# Patient Record
Sex: Male | Born: 1935 | Race: White | Hispanic: No | Marital: Single | Smoking: Former smoker
Health system: Southern US, Community
[De-identification: ages and names within clinical notes are randomized; demographics above are authoritative.]

## PROBLEM LIST (undated history)

## (undated) DIAGNOSIS — N182 Chronic kidney disease, stage 2 (mild): Secondary | ICD-10-CM

## (undated) DIAGNOSIS — R609 Edema, unspecified: Secondary | ICD-10-CM

## (undated) DIAGNOSIS — I270 Primary pulmonary hypertension: Secondary | ICD-10-CM

## (undated) DIAGNOSIS — L608 Other nail disorders: Secondary | ICD-10-CM

## (undated) DIAGNOSIS — Z7901 Long term (current) use of anticoagulants: Secondary | ICD-10-CM

## (undated) DIAGNOSIS — I238 Other current complications following acute myocardial infarction: Secondary | ICD-10-CM

## (undated) DIAGNOSIS — E039 Hypothyroidism, unspecified: Secondary | ICD-10-CM

## (undated) DIAGNOSIS — N529 Male erectile dysfunction, unspecified: Secondary | ICD-10-CM

## (undated) DIAGNOSIS — Z87891 Personal history of nicotine dependence: Secondary | ICD-10-CM

## (undated) DIAGNOSIS — K219 Gastro-esophageal reflux disease without esophagitis: Secondary | ICD-10-CM

## (undated) DIAGNOSIS — E785 Hyperlipidemia, unspecified: Secondary | ICD-10-CM

## (undated) DIAGNOSIS — I209 Angina pectoris, unspecified: Secondary | ICD-10-CM

## (undated) DIAGNOSIS — I2510 Atherosclerotic heart disease of native coronary artery without angina pectoris: Secondary | ICD-10-CM

## (undated) DIAGNOSIS — I10 Essential (primary) hypertension: Secondary | ICD-10-CM

## (undated) DIAGNOSIS — R413 Other amnesia: Secondary | ICD-10-CM

## (undated) DIAGNOSIS — H9190 Unspecified hearing loss, unspecified ear: Secondary | ICD-10-CM

## (undated) DIAGNOSIS — I509 Heart failure, unspecified: Secondary | ICD-10-CM

## (undated) DIAGNOSIS — I5021 Acute systolic (congestive) heart failure: Secondary | ICD-10-CM

## (undated) DIAGNOSIS — N183 Chronic kidney disease, stage 3 unspecified: Secondary | ICD-10-CM

## (undated) DIAGNOSIS — N433 Hydrocele, unspecified: Secondary | ICD-10-CM

## (undated) DIAGNOSIS — E119 Type 2 diabetes mellitus without complications: Secondary | ICD-10-CM

## (undated) DIAGNOSIS — I82409 Acute embolism and thrombosis of unspecified deep veins of unspecified lower extremity: Secondary | ICD-10-CM

## (undated) DIAGNOSIS — I2789 Other specified pulmonary heart diseases: Secondary | ICD-10-CM

## (undated) DIAGNOSIS — D649 Anemia, unspecified: Secondary | ICD-10-CM

## (undated) DIAGNOSIS — I4891 Unspecified atrial fibrillation: Secondary | ICD-10-CM

## (undated) DIAGNOSIS — H259 Unspecified age-related cataract: Secondary | ICD-10-CM

## (undated) DIAGNOSIS — R32 Unspecified urinary incontinence: Secondary | ICD-10-CM

## (undated) DIAGNOSIS — E559 Vitamin D deficiency, unspecified: Secondary | ICD-10-CM

## (undated) DIAGNOSIS — I43 Cardiomyopathy in diseases classified elsewhere: Secondary | ICD-10-CM

## (undated) DIAGNOSIS — L218 Other seborrheic dermatitis: Secondary | ICD-10-CM

## (undated) DIAGNOSIS — E876 Hypokalemia: Secondary | ICD-10-CM

## (undated) DIAGNOSIS — D509 Iron deficiency anemia, unspecified: Secondary | ICD-10-CM

## (undated) DIAGNOSIS — R269 Unspecified abnormalities of gait and mobility: Secondary | ICD-10-CM

## (undated) DIAGNOSIS — I428 Other cardiomyopathies: Secondary | ICD-10-CM

## (undated) DIAGNOSIS — R0602 Shortness of breath: Secondary | ICD-10-CM

## (undated) DIAGNOSIS — R39198 Other difficulties with micturition: Secondary | ICD-10-CM

## (undated) HISTORY — DX: Vitamin D deficiency, unspecified: E55.9

## (undated) HISTORY — DX: Other cardiomyopathies: I42.8

## (undated) HISTORY — DX: Cardiomyopathy in diseases classified elsewhere: I43

## (undated) HISTORY — DX: Hydrocele, unspecified: N43.3

## (undated) HISTORY — DX: Other difficulties with micturition: R39.198

## (undated) HISTORY — DX: Iron deficiency anemia, unspecified: D50.9

## (undated) HISTORY — PX: CORONARY ANGIOPLASTY WITH STENT PLACEMENT: SHX49

## (undated) HISTORY — DX: Other amnesia: R41.3

## (undated) HISTORY — DX: Other seborrheic dermatitis: L21.8

## (undated) HISTORY — DX: Chronic kidney disease, stage 3 unspecified: N18.30

## (undated) HISTORY — PX: OTHER SURGICAL HISTORY: SHX169

## (undated) HISTORY — DX: Acute systolic (congestive) heart failure: I50.21

## (undated) HISTORY — DX: Hypokalemia: E87.6

## (undated) HISTORY — DX: Primary pulmonary hypertension: I27.0

## (undated) HISTORY — DX: Edema, unspecified: R60.9

## (undated) HISTORY — DX: Chronic kidney disease, stage 2 (mild): N18.2

## (undated) HISTORY — DX: Anemia, unspecified: D64.9

## (undated) HISTORY — DX: Angina pectoris, unspecified: I20.9

## (undated) HISTORY — DX: Unspecified urinary incontinence: R32

## (undated) HISTORY — DX: Chronic kidney disease, stage 3 (moderate): N18.3

## (undated) HISTORY — DX: Acute embolism and thrombosis of unspecified deep veins of unspecified lower extremity: I82.409

## (undated) HISTORY — DX: Unspecified abnormalities of gait and mobility: R26.9

## (undated) HISTORY — DX: Other nail disorders: L60.8

## (undated) HISTORY — DX: Type 2 diabetes mellitus without complications: E11.9

## (undated) HISTORY — DX: Personal history of nicotine dependence: Z87.891

## (undated) HISTORY — DX: Hypothyroidism, unspecified: E03.9

## (undated) HISTORY — DX: Unspecified hearing loss, unspecified ear: H91.90

## (undated) HISTORY — DX: Unspecified age-related cataract: H25.9

## (undated) HISTORY — DX: Long term (current) use of anticoagulants: Z79.01

## (undated) HISTORY — DX: Essential (primary) hypertension: I10

## (undated) HISTORY — DX: Shortness of breath: R06.02

## (undated) HISTORY — DX: Male erectile dysfunction, unspecified: N52.9

## (undated) HISTORY — DX: Other current complications following acute myocardial infarction: I23.8

## (undated) HISTORY — DX: Hyperlipidemia, unspecified: E78.5

## (undated) HISTORY — DX: Atherosclerotic heart disease of native coronary artery without angina pectoris: I25.10

## (undated) HISTORY — DX: Other specified pulmonary heart diseases: I27.89

## (undated) HISTORY — DX: Unspecified atrial fibrillation: I48.91

## (undated) HISTORY — DX: Gastro-esophageal reflux disease without esophagitis: K21.9

---

## 1983-09-07 HISTORY — PX: CHOLECYSTECTOMY: SHX55

## 2009-05-07 HISTORY — PX: OTHER SURGICAL HISTORY: SHX169

## 2009-05-07 HISTORY — PX: DOPPLER ECHOCARDIOGRAPHY: SHX263

## 2009-05-12 ENCOUNTER — Ambulatory Visit: Payer: Self-pay | Admitting: Internal Medicine

## 2009-05-12 ENCOUNTER — Inpatient Hospital Stay (HOSPITAL_COMMUNITY): Admission: EM | Admit: 2009-05-12 | Discharge: 2009-05-26 | Payer: Self-pay | Admitting: Emergency Medicine

## 2009-05-13 ENCOUNTER — Encounter: Payer: Self-pay | Admitting: Internal Medicine

## 2009-05-13 ENCOUNTER — Encounter: Payer: Self-pay | Admitting: Cardiovascular Disease

## 2009-05-14 ENCOUNTER — Encounter (INDEPENDENT_AMBULATORY_CARE_PROVIDER_SITE_OTHER): Payer: Self-pay | Admitting: Internal Medicine

## 2009-05-14 ENCOUNTER — Ambulatory Visit: Payer: Self-pay | Admitting: Surgery

## 2009-05-15 ENCOUNTER — Encounter: Payer: Self-pay | Admitting: Cardiovascular Disease

## 2009-05-27 ENCOUNTER — Encounter: Payer: Self-pay | Admitting: Cardiovascular Disease

## 2009-05-28 ENCOUNTER — Ambulatory Visit: Payer: Self-pay | Admitting: Internal Medicine

## 2009-05-28 ENCOUNTER — Encounter: Payer: Self-pay | Admitting: Cardiovascular Disease

## 2009-05-28 DIAGNOSIS — D509 Iron deficiency anemia, unspecified: Secondary | ICD-10-CM

## 2009-05-28 DIAGNOSIS — I5021 Acute systolic (congestive) heart failure: Secondary | ICD-10-CM

## 2009-05-28 DIAGNOSIS — I2589 Other forms of chronic ischemic heart disease: Secondary | ICD-10-CM

## 2009-05-28 DIAGNOSIS — E876 Hypokalemia: Secondary | ICD-10-CM | POA: Insufficient documentation

## 2009-05-28 DIAGNOSIS — Z87891 Personal history of nicotine dependence: Secondary | ICD-10-CM

## 2009-05-28 DIAGNOSIS — E119 Type 2 diabetes mellitus without complications: Secondary | ICD-10-CM

## 2009-05-28 DIAGNOSIS — I4891 Unspecified atrial fibrillation: Secondary | ICD-10-CM

## 2009-05-28 DIAGNOSIS — I2789 Other specified pulmonary heart diseases: Secondary | ICD-10-CM

## 2009-05-28 DIAGNOSIS — I10 Essential (primary) hypertension: Secondary | ICD-10-CM

## 2009-05-28 DIAGNOSIS — N183 Chronic kidney disease, stage 3 (moderate): Secondary | ICD-10-CM

## 2009-05-28 LAB — CONVERTED CEMR LAB

## 2009-06-04 ENCOUNTER — Ambulatory Visit: Payer: Self-pay | Admitting: Cardiovascular Disease

## 2009-06-04 DIAGNOSIS — I82409 Acute embolism and thrombosis of unspecified deep veins of unspecified lower extremity: Secondary | ICD-10-CM | POA: Insufficient documentation

## 2009-06-04 DIAGNOSIS — I2510 Atherosclerotic heart disease of native coronary artery without angina pectoris: Secondary | ICD-10-CM

## 2009-06-04 DIAGNOSIS — I238 Other current complications following acute myocardial infarction: Secondary | ICD-10-CM

## 2009-06-10 ENCOUNTER — Ambulatory Visit: Payer: Self-pay | Admitting: Cardiology

## 2009-06-17 ENCOUNTER — Ambulatory Visit: Payer: Self-pay | Admitting: Cardiology

## 2009-06-18 ENCOUNTER — Encounter: Payer: Self-pay | Admitting: Cardiovascular Disease

## 2009-06-24 ENCOUNTER — Ambulatory Visit: Payer: Self-pay | Admitting: Cardiology

## 2009-06-26 ENCOUNTER — Telehealth: Payer: Self-pay | Admitting: Cardiovascular Disease

## 2009-06-27 ENCOUNTER — Encounter: Payer: Self-pay | Admitting: Cardiovascular Disease

## 2009-07-01 ENCOUNTER — Ambulatory Visit: Payer: Self-pay | Admitting: Cardiovascular Disease

## 2009-07-08 ENCOUNTER — Ambulatory Visit: Payer: Self-pay | Admitting: Internal Medicine

## 2009-07-08 LAB — CONVERTED CEMR LAB: POC INR: 2.2

## 2009-07-21 ENCOUNTER — Ambulatory Visit: Payer: Self-pay | Admitting: Cardiology

## 2009-07-21 LAB — CONVERTED CEMR LAB: POC INR: 4

## 2009-08-07 ENCOUNTER — Encounter: Payer: Self-pay | Admitting: Cardiovascular Disease

## 2009-08-11 ENCOUNTER — Encounter: Payer: Self-pay | Admitting: Internal Medicine

## 2009-08-13 ENCOUNTER — Encounter (INDEPENDENT_AMBULATORY_CARE_PROVIDER_SITE_OTHER): Payer: Self-pay | Admitting: Cardiology

## 2009-08-13 ENCOUNTER — Ambulatory Visit: Payer: Self-pay | Admitting: Cardiology

## 2009-08-18 ENCOUNTER — Ambulatory Visit: Payer: Self-pay | Admitting: Cardiovascular Disease

## 2009-08-25 ENCOUNTER — Encounter: Payer: Self-pay | Admitting: Cardiovascular Disease

## 2009-09-09 ENCOUNTER — Ambulatory Visit: Payer: Self-pay | Admitting: Cardiology

## 2009-09-09 LAB — CONVERTED CEMR LAB: POC INR: 1.6

## 2009-09-19 ENCOUNTER — Telehealth (INDEPENDENT_AMBULATORY_CARE_PROVIDER_SITE_OTHER): Payer: Self-pay | Admitting: *Deleted

## 2009-09-23 LAB — CONVERTED CEMR LAB
Chloride: 106 meq/L (ref 96–112)
GFR calc non Af Amer: 32.64 mL/min (ref >60)
Potassium: 3.8 meq/L (ref 3.5–5.1)
Sodium: 144 meq/L (ref 135–145)

## 2009-09-24 ENCOUNTER — Encounter: Payer: Self-pay | Admitting: Cardiovascular Disease

## 2009-10-01 ENCOUNTER — Ambulatory Visit: Payer: Self-pay | Admitting: Cardiovascular Disease

## 2009-10-01 ENCOUNTER — Ambulatory Visit: Payer: Self-pay | Admitting: Cardiology

## 2009-10-01 LAB — CONVERTED CEMR LAB: POC INR: 2.6

## 2009-10-14 LAB — CONVERTED CEMR LAB
GFR calc non Af Amer: 17.52 mL/min (ref >60)
Potassium: 4.9 meq/L (ref 3.5–5.1)
Sodium: 139 meq/L (ref 135–145)

## 2009-10-17 ENCOUNTER — Ambulatory Visit: Payer: Self-pay | Admitting: Cardiovascular Disease

## 2009-10-17 DIAGNOSIS — I428 Other cardiomyopathies: Secondary | ICD-10-CM | POA: Insufficient documentation

## 2009-10-23 ENCOUNTER — Ambulatory Visit: Payer: Self-pay | Admitting: Cardiovascular Disease

## 2009-10-23 ENCOUNTER — Ambulatory Visit: Payer: Self-pay | Admitting: Cardiology

## 2009-10-23 LAB — CONVERTED CEMR LAB
CO2: 31 meq/L (ref 19–32)
Calcium: 9.7 mg/dL (ref 8.4–10.5)
Chloride: 103 meq/L (ref 96–112)
Potassium: 4.6 meq/L (ref 3.5–5.1)
Sodium: 141 meq/L (ref 135–145)

## 2009-10-30 LAB — CONVERTED CEMR LAB
CO2: 30 meq/L (ref 19–32)
Chloride: 105 meq/L (ref 96–112)
GFR calc non Af Amer: 24.41 mL/min (ref >60)
Glucose, Bld: 112 mg/dL — ABNORMAL HIGH (ref 70–99)
Potassium: 4.7 meq/L (ref 3.5–5.1)
Sodium: 141 meq/L (ref 135–145)

## 2009-11-04 ENCOUNTER — Ambulatory Visit: Payer: Self-pay | Admitting: Cardiovascular Disease

## 2009-11-19 ENCOUNTER — Ambulatory Visit: Payer: Self-pay | Admitting: Internal Medicine

## 2009-12-03 ENCOUNTER — Encounter: Payer: Self-pay | Admitting: Cardiovascular Disease

## 2009-12-03 ENCOUNTER — Ambulatory Visit (HOSPITAL_COMMUNITY): Admission: RE | Admit: 2009-12-03 | Discharge: 2009-12-03 | Payer: Self-pay | Admitting: Cardiology

## 2009-12-03 ENCOUNTER — Ambulatory Visit: Payer: Self-pay

## 2009-12-03 ENCOUNTER — Ambulatory Visit: Payer: Self-pay | Admitting: Internal Medicine

## 2009-12-03 ENCOUNTER — Ambulatory Visit: Payer: Self-pay | Admitting: Cardiovascular Disease

## 2009-12-03 LAB — CONVERTED CEMR LAB: POC INR: 1.3

## 2009-12-12 ENCOUNTER — Ambulatory Visit: Payer: Self-pay | Admitting: Cardiology

## 2009-12-18 ENCOUNTER — Ambulatory Visit: Payer: Self-pay | Admitting: Internal Medicine

## 2010-01-15 ENCOUNTER — Ambulatory Visit: Payer: Self-pay | Admitting: Internal Medicine

## 2010-01-15 LAB — CONVERTED CEMR LAB: POC INR: 2.8

## 2010-02-12 ENCOUNTER — Ambulatory Visit: Payer: Self-pay | Admitting: Internal Medicine

## 2010-02-12 LAB — CONVERTED CEMR LAB: POC INR: 1.2

## 2010-02-19 ENCOUNTER — Ambulatory Visit: Payer: Self-pay | Admitting: Cardiology

## 2010-03-26 ENCOUNTER — Ambulatory Visit: Payer: Self-pay | Admitting: Cardiology

## 2010-04-01 ENCOUNTER — Ambulatory Visit: Payer: Self-pay | Admitting: Cardiology

## 2010-04-01 LAB — CONVERTED CEMR LAB: POC INR: 1

## 2010-04-07 ENCOUNTER — Ambulatory Visit: Payer: Self-pay | Admitting: Cardiology

## 2010-04-09 LAB — CONVERTED CEMR LAB
BUN: 21 mg/dL (ref 6–23)
CO2: 26 meq/L (ref 19–32)
Chloride: 107 meq/L (ref 96–112)
Creatinine, Ser: 1.7 mg/dL — ABNORMAL HIGH (ref 0.4–1.5)

## 2010-04-14 ENCOUNTER — Ambulatory Visit: Payer: Self-pay | Admitting: Cardiology

## 2010-05-13 ENCOUNTER — Ambulatory Visit: Payer: Self-pay | Admitting: Cardiovascular Disease

## 2010-05-22 ENCOUNTER — Ambulatory Visit: Payer: Self-pay | Admitting: Cardiovascular Disease

## 2010-05-26 LAB — CONVERTED CEMR LAB
ALT: 13 units/L (ref 0–53)
AST: 18 units/L (ref 0–37)
Bilirubin, Direct: 0.2 mg/dL (ref 0.0–0.3)
CO2: 26 meq/L (ref 19–32)
Calcium: 9.5 mg/dL (ref 8.4–10.5)
Creatinine, Ser: 1.8 mg/dL — ABNORMAL HIGH (ref 0.4–1.5)
Glucose, Bld: 119 mg/dL — ABNORMAL HIGH (ref 70–99)
Total Bilirubin: 0.9 mg/dL (ref 0.3–1.2)

## 2010-05-29 ENCOUNTER — Ambulatory Visit: Payer: Self-pay | Admitting: Internal Medicine

## 2010-08-03 ENCOUNTER — Telehealth (INDEPENDENT_AMBULATORY_CARE_PROVIDER_SITE_OTHER): Payer: Self-pay | Admitting: *Deleted

## 2010-10-02 ENCOUNTER — Ambulatory Visit: Admit: 2010-10-02 | Payer: Self-pay

## 2010-10-04 LAB — CONVERTED CEMR LAB
BUN: 19 mg/dL (ref 6–23)
Basophils Relative: 0.7 % (ref 0.0–3.0)
CO2: 27 meq/L (ref 19–32)
CO2: 29 meq/L (ref 19–32)
Chloride: 104 meq/L (ref 96–112)
Chloride: 108 meq/L (ref 96–112)
Creatinine, Ser: 1.8 mg/dL — ABNORMAL HIGH (ref 0.4–1.5)
Creatinine, Ser: 1.9 mg/dL — ABNORMAL HIGH (ref 0.4–1.5)
Eosinophils Absolute: 0.4 10*3/uL (ref 0.0–0.7)
Eosinophils Relative: 5.8 % — ABNORMAL HIGH (ref 0.0–5.0)
HCT: 32.7 % — ABNORMAL LOW (ref 39.0–52.0)
Lymphs Abs: 1.4 10*3/uL (ref 0.7–4.0)
MCHC: 35 g/dL (ref 30.0–36.0)
MCV: 93.8 fL (ref 78.0–100.0)
Monocytes Absolute: 0.7 10*3/uL (ref 0.1–1.0)
Platelets: 171 10*3/uL (ref 150.0–400.0)
Potassium: 3.3 meq/L — ABNORMAL LOW (ref 3.5–5.1)
RBC: 3.49 M/uL — ABNORMAL LOW (ref 4.22–5.81)
WBC: 7.3 10*3/uL (ref 4.5–10.5)

## 2010-10-06 NOTE — Medication Information (Signed)
Summary: rov/sp  Anticoagulant Therapy  Managed by: Bethena Midget, RN, BSN Referring MD: Clifton James MD, Cristal Deer PCP: Murray Hodgkins Supervising MD: Myrtis Ser MD, Tinnie Gens Indication 1: Atrial Fibrillation Indication 2: DVT Lab Used: [facility] [place] Site: [address] INR POC 1.2 INR RANGE 2-3  Dietary changes: no    Health status changes: no    Bleeding/hemorrhagic complications: no    Recent/future hospitalizations: no    Any changes in medication regimen? no    Recent/future dental: no  Any missed doses?: no       Is patient compliant with meds? yes       Allergies: 1)  Zestril  Anticoagulation Management History:      The patient is taking warfarin and comes in today for a routine follow up visit.  Positive risk factors for bleeding include an age of 75 years or older and presence of serious comorbidities.  The bleeding index is 'intermediate risk'.  Positive CHADS2 values include History of CHF, History of HTN, Age > 63 years old, and History of Diabetes.  Anticoagulation responsible provider: Myrtis Ser MD, Tinnie Gens.  INR POC: 1.2.  Cuvette Lot#: [id number].  Exp: 04/2011.    Anticoagulation Management Assessment/Plan:      The patient's current anticoagulation dose is Warfarin sodium 5 mg tabs: Use as directed by Anticoagulation Clinic.  The target INR is 2.0-3.0.  The next INR is due 03/02/2010.  Anticoagulation instructions were given to patient.  Results were reviewed/authorized by Bethena Midget, RN, BSN.  He was notified by Bethena Midget, RN, BSN.         Prior Anticoagulation Instructions: INR 1.2  Take 2 tablets today (Thursday), 1 1/2 tablets tomorrow (Friday), then resume same dose of 1/2 tablet every day except 1 tablet on Tuesday, Thursday and Saturday.    Current Anticoagulation Instructions: INR 1.2 Today take 2 pills then change dose to 1 pill everyday except 1/2 pill on Mondays, Wednesdays and Fridays. Recheck in 10 days.

## 2010-10-06 NOTE — Medication Information (Signed)
Summary: rov--coumadin clinic  Anticoagulant Therapy  Managed by: Weston Brass, PharmD Referring MD: Clifton Zakir Henner MD, Cristal Deer PCP: Murray Hodgkins Supervising MD: Johney Frame MD, Fayrene Fearing Indication 1: Atrial Fibrillation Indication 2: DVT Lab Used: [facility] [place] Site: [address] INR POC 2.8 INR RANGE 2-3  Dietary changes: no    Health status changes: no    Bleeding/hemorrhagic complications: no    Recent/future hospitalizations: no    Any changes in medication regimen? no    Recent/future dental: no  Any missed doses?: no       Is patient compliant with meds? yes       Allergies: 1)  Zestril  Anticoagulation Management History:      The patient is taking warfarin and comes in today for a routine follow up visit.  Positive risk factors for bleeding include an age of 75 years or older and presence of serious comorbidities.  The bleeding index is 'intermediate risk'.  Positive CHADS2 values include History of CHF, History of HTN, Age > 75 years old, and History of Diabetes.  Anticoagulation responsible provider: Harding Thomure MD, Fayrene Fearing.  INR POC: 2.8.  Cuvette Lot#: [id number].  Exp: 04/2011.    Anticoagulation Management Assessment/Plan:      The patient's current anticoagulation dose is Warfarin sodium 5 mg tabs: Use as directed by Anticoagulation Clinic.  The target INR is 2.0-3.0.  The next INR is due 02/12/2010.  Anticoagulation instructions were given to patient/spouse.  Results were reviewed/authorized by Weston Brass, PharmD.  He was notified by Weston Brass PharmD.         Prior Anticoagulation Instructions: INR=2.0  will continue with present dosing schedule---1/2 tablet coumadin on sun,mon,wed,fri--1 tablet on tues,thur,saturday  Current Anticoagulation Instructions: INR 2.8  Continue same dose of 1/2 tablet every day except 1 tablet on Tuesday, Thursday and Saturday.

## 2010-10-06 NOTE — Medication Information (Signed)
Summary: rov/sp  Anticoagulant Therapy  Managed by: Cloyde Reams, RN, BSN Referring MD: Clifton James MD, Cristal Deer PCP: Murray Hodgkins Supervising MD: Daleen Squibb MD, Maisie Fus Indication 1: Atrial Fibrillation Indication 2: DVT Lab Used: [facility] [place] Site: [address] INR POC 2.2 INR RANGE 2-3  Dietary changes: no    Health status changes: no    Bleeding/hemorrhagic complications: no    Recent/future hospitalizations: no    Any changes in medication regimen? no    Recent/future dental: no  Any missed doses?: no       Is patient compliant with meds? yes       Allergies: 1)  Zestril  Anticoagulation Management History:      The patient is taking warfarin and comes in today for a routine follow up visit.  Positive risk factors for bleeding include an age of 75 years or older and presence of serious comorbidities.  The bleeding index is 'intermediate risk'.  Positive CHADS2 values include History of CHF, History of HTN, Age > 32 years old, and History of Diabetes.  Anticoagulation responsible Ruvim Risko: Daleen Squibb MD, Maisie Fus.  INR POC: 2.2.  Cuvette Lot#: [id number].  Exp: 06/2011.    Anticoagulation Management Assessment/Plan:      The patient's current anticoagulation dose is Warfarin sodium 5 mg tabs: Use as directed by Anticoagulation Clinic.  The target INR is 2.0-3.0.  The next INR is due 04/24/2010.  Anticoagulation instructions were given to patient.  Results were reviewed/authorized by Cloyde Reams, RN, BSN.  He was notified by Cloyde Reams RN.         Prior Anticoagulation Instructions: INR 1.3  Take 5mg  daily (oblong peach tablet- has #5 on one side and barr on the other side)   Current Anticoagulation Instructions: INR 2.2  Continue on same dosage 5mg  daily (OBLONG PEACH TABLET-has #5 on one side and barr on other side.  Recheck in 10 days.

## 2010-10-06 NOTE — Medication Information (Signed)
Summary: coumadin  Anticoagulant Therapy  Managed by: Cloyde Reams, RN, BSN Referring MD: Clifton James MD, Cristal Deer PCP: Murray Hodgkins Supervising MD: Eden Emms MD, Theron Arista Indication 1: Atrial Fibrillation Indication 2: DVT Lab Used: [facility] [place] Site: [address] INR POC 1.3 INR RANGE 2-3  Dietary changes: no    Health status changes: no    Bleeding/hemorrhagic complications: no    Recent/future hospitalizations: no    Any changes in medication regimen? no    Recent/future dental: no  Any missed doses?: no       Is patient compliant with meds? yes       Allergies: 1)  Zestril  Anticoagulation Management History:      The patient is taking warfarin and comes in today for a routine follow up visit.  Positive risk factors for bleeding include an age of 75 years or older and presence of serious comorbidities.  The bleeding index is 'intermediate risk'.  Positive CHADS2 values include History of CHF, History of HTN, Age > 75 years old, and History of Diabetes.  Anticoagulation responsible provider: Eden Emms MD, Theron Arista.  INR POC: 1.3.  Cuvette Lot#: [id number].  Exp: 01/2011.    Anticoagulation Management Assessment/Plan:      The patient's current anticoagulation dose is Warfarin sodium 5 mg tabs: Use as directed by Anticoagulation Clinic.  The target INR is 2.0-3.0.  The next INR is due 12/12/2009.  Anticoagulation instructions were given to patient/spouse.  Results were reviewed/authorized by Cloyde Reams, RN, BSN.  He was notified by Cloyde Reams RN.         Prior Anticoagulation Instructions: INR 1.5  Start taking 1/2 tablet daily except 1 tablet on Wednesdays.  Recheck 2 weeks.    Current Anticoagulation Instructions: INR 1.3  Take 1 tablet today and tomorrow then increase dosage to 1/2 tablet daily except 1 tablet on Wednesdays and Saturdays.  Recheck in 10 days.

## 2010-10-06 NOTE — Letter (Signed)
Summary: [facility] discharge note  [facility] discharge note   Imported By: Kassie Mends 10/27/2009 10:04:39  _____________________________________________________________________  External Attachment:    Type:   Image     Comment:   External Document

## 2010-10-06 NOTE — Letter (Signed)
Summary: [facility] [employer]  [employer]   Imported By: Marylou Mccoy 09/16/2009 09:27:43  _____________________________________________________________________  External Attachment:    Type:   Image     Comment:   External Document

## 2010-10-06 NOTE — Assessment & Plan Note (Signed)
Summary: per check out/also having echo @1pm /saf   Visit Type:  Follow-up Primary Provider:  Murray Hodgkins  CC:  No complaints..  History of Present Illness: 77 yo WM with recent admission to hospital September 2009  with NSTEMI and was found to have severe LV dysfunction with EF of 20%, non-viable myocardium in anterior and inferior walls with high degree stenosis in a Ramus Intermediate branch and viability in the lateral wall now s/p bare metal stent to the Ramus Intermediate artery.  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 He had paroxysmal atrial fibrillation in the hospital. He is following in our coumadin clinic. He tells me that he has been feeling well without any SOB, CP, palpitations, near syncope or syncope.  He had no dizziness or vertigo.  Pt has continued gait instability and with his severe cardiomyopathy, he has weakness and generalized fatigue. He has not fallen at home. He has been using his scooter around the house.  His creatinine has been elevated from baseline and his Lasix was stopped several weeks ago.   Problems Prior to Update: 1)  Cardiomyopathy  (ICD-425.4) 2)  Cad, Native Vessel  (ICD-414.01) 3)  Dvt  (ICD-453.40) 4)  Mural Thrombus, Apex of Heart  (ICD-429.79) 5)  Mi/non St Elevation  (ICD-410.90) 6)  Paroxysmal Atrial Fibrillation  (ICD-427.31) 7)  Coumadin Therapy  (ICD-V58.61) 8)  Cardiomyopathy, Ischemic  (ICD-414.8) 9)  Systolic Heart Failure, Acute  (ICD-428.21) 10)  Renal Disease, Chronic, Stage Iii  (ICD-585.3) 11)  Pulmonary Hypertension  (ICD-416.8) 12)  Hypertension  (ICD-401.9) 13)  Hypokalemia  (ICD-276.8) 14)  Tobacco Use, Quit  (ICD-V15.82) 15)  Dm  (ICD-250.00) 16)  Anemia, Iron Deficiency  (ICD-280.9)  Current Medications (verified): 1)  Crestor 40 Mg Tabs (Rosuvastatin Calcium) .... Take One Tablet By Mouth Daily. 2)  Aspirin Ec 325 Mg Tbec  (Aspirin) .... Take One Half Tablet By Mouth Daily 3)  Warfarin Sodium 5 Mg Tabs (Warfarin Sodium) .... Use As Directed By Anticoagulation Clinic 4)  Amiodarone Hcl 200 Mg Tabs (Amiodarone Hcl) .... Take One Tablet By Mouth Daily 5)  Carvedilol 6.25 Mg Tabs (Carvedilol) .... Take One Tablet By Mouth Twice A Day 6)  Nitrostat 0.4 Mg Subl (Nitroglycerin) .... Place 1 Tablet Under The Tounge Every 5 Minutes As Needed For Chest Pain 7)  Ferrous Sulfate 325 (65 Fe) Mg Tabs (Ferrous Sulfate) .... Take 1 Tablet By Mouth Two Times A Day With Food 8)  Metformin Hcl 750 Mg Xr24h-Tab (Metformin Hcl) .... Take 1 Tablet By Mouth Once Daily With Food 9)  Lisinopril 5 Mg Tabs (Lisinopril) .... Take One Tablet By Mouth Daily 10)  Potassium Chloride Crys Cr 20 Meq Cr-Tabs (Potassium Chloride Crys Cr) .... Take One Tablet By Mouth Daily  Allergies: 1)  Zestril  Past History:  Past Medical History: Reviewed history from 06/04/2009 and no changes required. MI/NON ST ELEVATION (ICD-410.90) PAROXYSMAL ATRIAL FIBRILLATION (ICD-427.31) COUMADIN THERAPY (ICD-V58.61) CARDIOMYOPATHY, ISCHEMIC (ICD-414.8) SYSTOLIC HEART FAILURE, ACUTE (ICD-428.21), EF of 20%.  RENAL DISEASE, CHRONIC, STAGE III (ICD-585.3) PULMONARY HYPERTENSION (ICD-416.8) HYPERTENSION (ICD-401.9) HYPOKALEMIA (ICD-276.8) TOBACCO USE, QUIT (ICD-V15.82) DM (ICD-250.00) ANEMIA, IRON DEFICIENCY (ICD-280.9) Left ventricular apical thrombus Right lower ext DVT     Social History: Reviewed history from 05/28/2009 and no changes required. Lives in [place] with his girlfriend who he has been  with for over 40 years.  He has  a 100 pack-year smoking history but quit  in 1996.  He denies the use of alcohol or the use of illicit drugs.  Review of Systems       Right lower ext edema, mild, chronic. Gait instability without dizziness.   Vital Signs:  Patient profile:   77 year old male Height:      70 inches Weight:      193 pounds BMI:      27.79 BP sitting:   149 / 74  (right arm) Cuff size:   regular  Vitals Entered By: Oswald Hillock (November 04, 2009 2:39 PM)  Physical Exam  General:  General: Well developed, well nourished, NAD HEENT: OP clear, mucus membranes moist SKIN: warm, dry Neuro: No focal deficits Musculoskeletal: Muscle strength 5/5 all ext Psychiatric: Mood and affect normal Neck: No JVD, no carotid bruits, no thyromegaly, no lymphadenopathy. Lungs:Clear bilaterally, no wheezes, rhonci, crackles CV: RRR. Systolic  murmur. No gallops rubs Abdomen: soft, NT, ND, BS present Extremities: Trace to 1+ right lower ext edema, No left lower ext edema. pulses 2+.     EKG  Procedure date:  11/04/2009  Findings:      Sinus bradycardia, rate 59 bpm. Prior septal infarct. Q waves lead III.   Impression & Recommendations:  Problem # 1:  CARDIOMYOPATHY, ISCHEMIC (ICD-414.8) Volume status ok. He denies any SOB however he is not an active gentleman. I have held his Lasix secondary to renal insufficiency. Will recheck BMET today. If creatinine still elevated, will d/c Ace-inhibitor. Continue beta blocker, ASA, statin. I had planned to repeat an echo today but he missed the echo appt and showed up late for his scheduled visit. Will schedule echo for next several weeks.   Orders: TLB-BMP (Basic Metabolic Panel-BMET) (80048-METABOL)  His updated medication list for this problem includes:    Aspirin Ec 325 Mg Tbec (Aspirin) .Marland Kitchen... Take one half tablet by mouth daily    Warfarin Sodium 5 Mg Tabs (Warfarin sodium) ..... Use as directed by anticoagulation clinic    Amiodarone Hcl 200 Mg Tabs (Amiodarone hcl) .Marland Kitchen... Take one tablet by mouth daily    Carvedilol 6.25 Mg Tabs (Carvedilol) .Marland Kitchen... Take one tablet by mouth twice a day    Nitrostat 0.4 Mg Subl (Nitroglycerin) .Marland Kitchen... Place 1 tablet under the tounge every 5 minutes as needed for chest pain    Lisinopril 5 Mg Tabs (Lisinopril) .Marland Kitchen... Take one tablet by mouth  daily  Problem # 2:  CAD, NATIVE VESSEL (ICD-414.01) Stable. Continue meds as listed below.   His updated medication list for this problem includes:    Aspirin Ec 325 Mg Tbec (Aspirin) .Marland Kitchen... Take one half tablet by mouth daily    Warfarin Sodium 5 Mg Tabs (Warfarin sodium) ..... Use as directed by anticoagulation clinic    Carvedilol 6.25 Mg Tabs (Carvedilol) .Marland Kitchen... Take one tablet by mouth twice a day    Nitrostat 0.4 Mg Subl (Nitroglycerin) .Marland Kitchen... Place 1 tablet under the tounge every 5 minutes as needed for chest pain    Lisinopril 5 Mg Tabs (Lisinopril) .Marland Kitchen... Take one tablet by mouth daily  Orders: EKG w/ Interpretation (93000)  Problem # 3:  MURAL THROMBUS, APEX OF HEART (ICD-429.79) On coumadin therapy. Folllowed in our coumadin clinic.   Problem # 4:  DVT (ICD-453.40) Chronic right  lower ext DVT. On coumadin therapy.   Problem # 5:  PAROXYSMAL ATRIAL FIBRILLATION (ICD-427.31) Maintaining sinus rhythm on amiodarone, Coreg. He is on chronic coumadin therapy.  His updated medication list for this problem includes:    Aspirin Ec 325 Mg Tbec (Aspirin) .Marland Kitchen... Take one half tablet by mouth daily    Warfarin Sodium 5 Mg Tabs (Warfarin sodium) ..... Use as directed by anticoagulation clinic    Amiodarone Hcl 200 Mg Tabs (Amiodarone hcl) .Marland Kitchen... Take one tablet by mouth daily    Carvedilol 6.25 Mg Tabs (Carvedilol) .Marland Kitchen... Take one tablet by mouth twice a day  Problem # 6:  RENAL DISEASE, CHRONIC, STAGE III (ICD-585.3) Repeat BMET today.   Problem # 7:  HYPERTENSION (ICD-401.9) BP mildly elevated today however I will not make any medication changes. He is sensitive to changes in his medications. I do  not want to tip the balance since he is doing so well.   His updated medication list for this problem includes:    Aspirin Ec 325 Mg Tbec (Aspirin) .Marland Kitchen... Take one half tablet by mouth daily    Carvedilol 6.25 Mg Tabs (Carvedilol) .Marland Kitchen... Take one tablet by mouth twice a day    Lisinopril 5  Mg Tabs (Lisinopril) .Marland Kitchen... Take one tablet by mouth daily  Patient Instructions: 1)  Your physician recommends that you schedule a follow-up appointment in: 6 months 2)  Echo resheduled to day of next coumadin clinic appointment--November 13, 2009

## 2010-10-06 NOTE — Medication Information (Signed)
Summary: rov/sp  Anticoagulant Therapy  Managed by: Weston Brass, PharmD Referring MD: Clifton James MD, Cristal Deer PCP: Murray Hodgkins Supervising MD: Daleen Squibb MD, Maisie Fus Indication 1: Atrial Fibrillation Indication 2: DVT Lab Used: [facility] [place] Site: [address] INR POC 1.0 INR RANGE 2-3  Dietary changes: no    Health status changes: no    Bleeding/hemorrhagic complications: no    Recent/future hospitalizations: no    Any changes in medication regimen? no    Recent/future dental: no  Any missed doses?: no       Is patient compliant with meds? yes      Comments: Pt does not report missing any doses.  Asked him to bring medications in at next visit so we can make sure he is taking the right things.  Showed him 1mg  and 5mg  tablets.  He says he has 1mg  tablets rather than 5mg .    Allergies: 1)  Zestril  Anticoagulation Management History:      The patient is taking warfarin and comes in today for a routine follow up visit.  Positive risk factors for bleeding include an age of 1 years or older and presence of serious comorbidities.  The bleeding index is 'intermediate risk'.  Positive CHADS2 values include History of CHF, History of HTN, Age > 60 years old, and History of Diabetes.  Anticoagulation responsible provider: Daleen Squibb MD, Maisie Fus.  INR POC: 1.0.  Cuvette Lot#: [id number].  Exp: 06/2011.    Anticoagulation Management Assessment/Plan:      The patient's current anticoagulation dose is Warfarin sodium 5 mg tabs: Use as directed by Anticoagulation Clinic.  The target INR is 2.0-3.0.  The next INR is due 04/08/2010.  Anticoagulation instructions were given to patient.  Results were reviewed/authorized by Weston Brass, PharmD.  He was notified by Weston Brass PharmD.         Prior Anticoagulation Instructions: INR 1.0  Take 3 tabs of the 1mg  daily.  Recheck on 7/27.     Current Anticoagulation Instructions: INR 1.0  Take 4mg  daily until next week.   Bring  medications to next visit.

## 2010-10-06 NOTE — Progress Notes (Signed)
Summary: rx put on hold back 09/2009  Phone Note Outgoing Call Call back at pharmacy   Summary of Call: CMA s/w pharmacist and advised that Dr. Clifton James put Lasix on hold back 09/2009, pharmacist gave verbal understanding and said she will advise pt of this. Danielle Rankin, CMA  August 03, 2010 9:47 AM

## 2010-10-06 NOTE — Medication Information (Signed)
Summary: rov.mp  Anticoagulant Therapy  Managed by: Cloyde Reams, RN, BSN Referring MD: Clifton James MD, Cristal Deer PCP: Murray Hodgkins Supervising MD: Juanda Chance MD, Merle Whitehorn Indication 1: Atrial Fibrillation Indication 2: DVT Lab Used: [facility]  Site: [address] INR POC 1.6 INR RANGE 2-3  Dietary changes: no    Health status changes: no    Bleeding/hemorrhagic complications: no    Recent/future hospitalizations: no    Any changes in medication regimen? no    Recent/future dental: no  Any missed doses?: no       Is patient compliant with meds? yes       Allergies (verified): 1)  Zestril  Anticoagulation Management History:      The patient is taking warfarin and comes in today for a routine follow up visit.  Positive risk factors for bleeding include an age of 75 years or older and presence of serious comorbidities.  The bleeding index is 'intermediate risk'.  Positive CHADS2 values include History of CHF, History of HTN, Age > 56 years old, and History of Diabetes.  Anticoagulation responsible provider: Juanda Chance MD, Smitty Cords.  INR POC: 1.6.  Cuvette Lot#: [id number].  Exp: 10/2010.    Anticoagulation Management Assessment/Plan:      The patient's current anticoagulation dose is Warfarin sodium 5 mg tabs: Use as directed by Anticoagulation Clinic.  The target INR is 2.0-3.0.  The next INR is due 09/23/2009.  Anticoagulation instructions were given to patient/spouse.  Results were reviewed/authorized by Cloyde Reams, RN, BSN.  He was notified by Cloyde Reams RN.         Prior Anticoagulation Instructions: INR 3.4  Skip 1 day then take 0.25 tab each Thursday and 0.5 tab on all other days.  Recheck in 3 weeks.    Current Anticoagulation Instructions: INR 1.6  Take 1 tablet today then start taking 1/2 tablet everyday.  Recheck in 2 weeks.

## 2010-10-06 NOTE — Medication Information (Signed)
Summary: rov/ewj  Anticoagulant Therapy  Managed by: Bethena Midget, RN, BSN Referring MD: Clifton James MD, Cristal Deer PCP: Murray Hodgkins Supervising MD: Myrtis Ser MD, Tinnie Gens Indication 1: Atrial Fibrillation Indication 2: DVT Lab Used: [facility] [place] Site: [address] INR POC 1.0 INR RANGE 2-3  Dietary changes: no    Health status changes: no    Bleeding/hemorrhagic complications: no    Recent/future hospitalizations: no    Any changes in medication regimen? no    Recent/future dental: no  Any missed doses?: yes     Details: patient has been taking the old 1mg  strength from a previous prescription instead of the 5mg  he was supposed to take  Is patient compliant with meds? yes       Allergies: 1)  Zestril  Anticoagulation Management History:      The patient is taking warfarin and comes in today for a routine follow up visit.  Positive risk factors for bleeding include an age of 75 years or older and presence of serious comorbidities.  The bleeding index is 'intermediate risk'.  Positive CHADS2 values include History of CHF, History of HTN, Age > 75 years old, and History of Diabetes.  Anticoagulation responsible provider: Myrtis Ser MD, Tinnie Gens.  INR POC: 1.0.  Cuvette Lot#: [id number].  Exp: 06/2011.    Anticoagulation Management Assessment/Plan:      The patient's current anticoagulation dose is Warfarin sodium 5 mg tabs: Use as directed by Anticoagulation Clinic.  The target INR is 2.0-3.0.  The next INR is due 04/01/2010.  Anticoagulation instructions were given to patient.  Results were reviewed/authorized by Bethena Midget, RN, BSN.  He was notified by Dillard Cannon.         Prior Anticoagulation Instructions: INR 1.2 Today take 2 pills then change dose to 1 pill everyday except 1/2 pill on Mondays, Wednesdays and Fridays. Recheck in 10 days.   Current Anticoagulation Instructions: INR 1.0  Take 3 tabs of the 1mg  daily.  Recheck on 7/27.

## 2010-10-06 NOTE — Medication Information (Signed)
Summary: rov/eac NEEDS TO HAVE AN ECHO AND NEXT COUMADIN APPT.  Anticoagulant Therapy  Managed by: Cloyde Reams, RN, BSN Referring MD: Clifton Renaud Celli MD, Cristal Deer PCP: Murray Hodgkins Supervising MD: Johney Frame MD, Fayrene Fearing Indication 1: Atrial Fibrillation Indication 2: DVT Lab Used: [facility] [place] Site: [address] INR POC 1.5 INR RANGE 2-3  Dietary changes: no    Health status changes: no    Bleeding/hemorrhagic complications: no    Recent/future hospitalizations: no    Any changes in medication regimen? no    Recent/future dental: no  Any missed doses?: no       Is patient compliant with meds? yes       Allergies: 1)  Zestril  Anticoagulation Management History:      The patient is taking warfarin and comes in today for a routine follow up visit.  Positive risk factors for bleeding include an age of 75 years or older and presence of serious comorbidities.  The bleeding index is 'intermediate risk'.  Positive CHADS2 values include History of CHF, History of HTN, Age > 65 years old, and History of Diabetes.  Anticoagulation responsible provider: Natika Geyer MD, Fayrene Fearing.  INR POC: 1.5.  Cuvette Lot#: [id number].  Exp: 01/2011.    Anticoagulation Management Assessment/Plan:      The patient's current anticoagulation dose is Warfarin sodium 5 mg tabs: Use as directed by Anticoagulation Clinic.  The target INR is 2.0-3.0.  The next INR is due 12/03/2009.  Anticoagulation instructions were given to patient/spouse.  Results were reviewed/authorized by Cloyde Reams, RN, BSN.  He was notified by Cloyde Reams RN.         Prior Anticoagulation Instructions: INR 1.7  Take 1 tablet today.  Then return to normal dosing schedule of 1/2 tablet daily.  Return to clinic in 3 weeks.  Current Anticoagulation Instructions: INR 1.5  Start taking 1/2 tablet daily except 1 tablet on Wednesdays.  Recheck 2 weeks.

## 2010-10-06 NOTE — Medication Information (Signed)
Summary: rov/eh  Anticoagulant Therapy  Managed by: Eda Keys, PharmD Referring MD: Clifton James MD, Cristal Deer PCP: Murray Hodgkins Supervising MD: Myrtis Ser MD, Tinnie Gens Indication 1: Atrial Fibrillation Indication 2: DVT Lab Used: [facility] [place] Site: [address] INR POC 1.7 INR RANGE 2-3  Dietary changes: no    Health status changes: no    Bleeding/hemorrhagic complications: no    Recent/future hospitalizations: no    Any changes in medication regimen? no    Recent/future dental: no  Any missed doses?: no       Is patient compliant with meds? yes       Allergies: 1)  Zestril  Anticoagulation Management History:      The patient is taking warfarin and comes in today for a routine follow up visit.  Positive risk factors for bleeding include an age of 74 years or older and presence of serious comorbidities.  The bleeding index is 'intermediate risk'.  Positive CHADS2 values include History of CHF, History of HTN, Age > 75 years old, and History of Diabetes.  Anticoagulation responsible provider: Myrtis Ser MD, Tinnie Gens.  INR POC: 1.7.  Cuvette Lot#: [id number].  Exp: 12/2010.    Anticoagulation Management Assessment/Plan:      The patient's current anticoagulation dose is Warfarin sodium 5 mg tabs: Use as directed by Anticoagulation Clinic.  The target INR is 2.0-3.0.  The next INR is due 11/13/2009.  Anticoagulation instructions were given to patient/spouse.  Results were reviewed/authorized by Eda Keys, PharmD.  He was notified by Eda Keys.         Prior Anticoagulation Instructions: INR 2.6  Continue taking 2.5mg  (1/2 tablet) daily. Recheck in 3 weeks.  Current Anticoagulation Instructions: INR 1.7  Take 1 tablet today.  Then return to normal dosing schedule of 1/2 tablet daily.  Return to clinic in 3 weeks.

## 2010-10-06 NOTE — Medication Information (Signed)
Summary: rov/sp  Anticoagulant Therapy  Managed by: Weston Brass, PharmD Referring MD: Clifton James MD, Cristal Deer PCP: Murray Hodgkins Supervising MD: Tenny Craw MD, Gunnar Fusi Indication 1: Atrial Fibrillation Indication 2: DVT Lab Used: [facility] [place] Site: [address] INR POC 1.2 INR RANGE 2-3  Dietary changes: no    Health status changes: no    Bleeding/hemorrhagic complications: no    Recent/future hospitalizations: no    Any changes in medication regimen? no    Recent/future dental: no  Any missed doses?: no       Is patient compliant with meds? yes       Allergies: 1)  Zestril  Anticoagulation Management History:      The patient is taking warfarin and comes in today for a routine follow up visit.  Positive risk factors for bleeding include an age of 54 years or older and presence of serious comorbidities.  The bleeding index is 'intermediate risk'.  Positive CHADS2 values include History of CHF, History of HTN, Age > 10 years old, and History of Diabetes.  Anticoagulation responsible provider: Tenny Craw MD, Gunnar Fusi.  INR POC: 1.2.  Cuvette Lot#: [id number].  Exp: 04/2011.    Anticoagulation Management Assessment/Plan:      The patient's current anticoagulation dose is Warfarin sodium 5 mg tabs: Use as directed by Anticoagulation Clinic.  The target INR is 2.0-3.0.  The next INR is due 02/19/2010.  Anticoagulation instructions were given to patient/spouse.  Results were reviewed/authorized by Weston Brass, PharmD.  He was notified by Weston Brass PharmD.         Prior Anticoagulation Instructions: INR 2.8  Continue same dose of 1/2 tablet every day except 1 tablet on Tuesday, Thursday and Saturday.  Current Anticoagulation Instructions: INR 1.2  Take 2 tablets today (Thursday), 1 1/2 tablets tomorrow (Friday), then resume same dose of 1/2 tablet every day except 1 tablet on Tuesday, Thursday and Saturday.

## 2010-10-06 NOTE — Assessment & Plan Note (Signed)
Summary: 6 month rov/sl   Visit Type:  6 months follow up Primary Provider:  Murray Hodgkins  CC:  No complains.  History of Present Illness: 77 yo WM with admission to hospital September 2009  with NSTEMI and was found to have severe LV dysfunction with EF of 20%, non-viable myocardium in anterior and inferior walls with high degree stenosis in a Ramus Intermediate branch and viability in the lateral wall now s/p bare metal stent to the Ramus Intermediate artery.  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 He had paroxysmal atrial fibrillation in the hospital. He is following in our coumadin clinic. He tells me that he has been feeling well without any SOB, CP, palpitations, near syncope or syncope.  He had no dizziness or vertigo.  Pt has continued gait instability and with his severe cardiomyopathy, he has weakness and generalized fatigue. He has not fallen at home.  He tells me that he has been feeling well.   Current Medications (verified): 1)  Crestor 40 Mg Tabs (Rosuvastatin Calcium) .... Take One Tablet By Mouth Daily. 2)  Warfarin Sodium 5 Mg Tabs (Warfarin Sodium) .... Use As Directed By Anticoagulation Clinic 3)  Amiodarone Hcl 200 Mg Tabs (Amiodarone Hcl) .... Take One Tablet By Mouth Daily 4)  Carvedilol 6.25 Mg Tabs (Carvedilol) .... Take One Tablet By Mouth Twice A Day 5)  Nitrostat 0.4 Mg Subl (Nitroglycerin) .... Place 1 Tablet Under The Tounge Every 5 Minutes As Needed For Chest Pain 6)  Ferrous Sulfate 325 (65 Fe) Mg Tabs (Ferrous Sulfate) .... Take 1 Tablet By Mouth Once A Day 7)  Metformin Hcl 750 Mg Xr24h-Tab (Metformin Hcl) .... Take 1 Tablet By Mouth Once Daily With Food 8)  Lisinopril 5 Mg Tabs (Lisinopril) .... Take One Tablet By Mouth Daily 9)  Potassium Chloride Crys Cr 20 Meq Cr-Tabs (Potassium Chloride Crys Cr) .... Take One Tablet By Mouth Daily  Allergies: 1)   Zestril  Past History:  Past Medical History: Reviewed history from 06/04/2009 and no changes required. MI/NON ST ELEVATION (ICD-410.90) PAROXYSMAL ATRIAL FIBRILLATION (ICD-427.31) COUMADIN THERAPY (ICD-V58.61) CARDIOMYOPATHY, ISCHEMIC (ICD-414.8) SYSTOLIC HEART FAILURE, ACUTE (ICD-428.21), EF of 20%.  RENAL DISEASE, CHRONIC, STAGE III (ICD-585.3) PULMONARY HYPERTENSION (ICD-416.8) HYPERTENSION (ICD-401.9) HYPOKALEMIA (ICD-276.8) TOBACCO USE, QUIT (ICD-V15.82) DM (ICD-250.00) ANEMIA, IRON DEFICIENCY (ICD-280.9) Left ventricular apical thrombus Right lower ext DVT     Social History: Reviewed history from 05/28/2009 and no changes required. Lives in [place] with his girlfriend who he has been  with for over 40 years.  He has a 100 pack-year smoking history but quit  in 1996.  He denies the use of alcohol or the use of illicit drugs.  Review of Systems  The patient denies fatigue, malaise, fever, weight gain/loss, vision loss, decreased hearing, hoarseness, chest pain, palpitations, shortness of breath, prolonged cough, wheezing, sleep apnea, coughing up blood, abdominal pain, blood in stool, nausea, vomiting, diarrhea, heartburn, incontinence, blood in urine, muscle weakness, joint pain, leg swelling, rash, skin lesions, headache, fainting, dizziness, depression, anxiety, enlarged lymph nodes, easy bruising or bleeding, and environmental allergies.    Vital Signs:  Patient profile:   77 year old male Height:      70 inches Weight:      194.50 pounds BMI:     28.01 Pulse rate:   60 / minute Pulse rhythm:   irregular Resp:  18 per minute BP sitting:   140 / 72  (left arm) Cuff size:   large  Vitals Entered By: Vikki Ports (May 13, 2010 2:47 PM)  Physical Exam  General:  General: Well developed, well nourished, NAD Musculoskeletal: Muscle strength 5/5 all ext Psychiatric: Mood and affect normal Neck: No JVD, no carotid bruits, no thyromegaly, no  lymphadenopathy. Lungs:Clear bilaterally, no wheezes, rhonci, crackles CV: RRR no murmurs, gallops rubs Abdomen: soft, NT, ND, BS present Extremities: No edema, pulses 2+.    Echocardiogram  Procedure date:  12/03/2009  Findings:      Left ventricle: The cavity size was mildly dilated. Wall thickness       was increased in a pattern of mild LVH. There was moderate       concentric hypertrophy. Systolic function was moderately to       severely reduced. The estimated ejection fraction was in the range       of 30% to 35%. There is akinesis of the anteroseptal, anterior,       apical and mid to distal inferoseptalmyocardium. There is swirling       of Definity contrast in the apex but no apparent LV clot.     - Aortic valve: Trivial regurgitation. Valve area: 2.4cm 2(VTI).       Valve area: 2.39cm 2 (Vmax).     - Left atrium: The atrium was mildly dilated.  EKG  Procedure date:  05/13/2010  Findings:      Sinus rhythm with 1st degree AV block. Old inferior and anteroseptal infarct.   Impression & Recommendations:  Problem # 1:  CARDIOMYOPATHY, ISCHEMIC (ICD-414.8) Stable. He has no signs or symtpoms of heart failure.  Continue current medications.   The following medications were removed from the medication list:    Aspirin Ec 325 Mg Tbec (Aspirin) .Marland Kitchen... Take one half tablet by mouth daily His updated medication list for this problem includes:    Warfarin Sodium 5 Mg Tabs (Warfarin sodium) ..... Use as directed by anticoagulation clinic    Amiodarone Hcl 200 Mg Tabs (Amiodarone hcl) .Marland Kitchen... Take one tablet by mouth daily    Carvedilol 6.25 Mg Tabs (Carvedilol) .Marland Kitchen... Take one tablet by mouth twice a day    Nitrostat 0.4 Mg Subl (Nitroglycerin) .Marland Kitchen... Place 1 tablet under the tounge every 5 minutes as needed for chest pain    Lisinopril 5 Mg Tabs (Lisinopril) .Marland Kitchen... Take one tablet by mouth daily  The following medications were removed from the medication list:    Aspirin Ec  325 Mg Tbec (Aspirin) .Marland Kitchen... Take one half tablet by mouth daily His updated medication list for this problem includes:    Warfarin Sodium 5 Mg Tabs (Warfarin sodium) ..... Use as directed by anticoagulation clinic    Amiodarone Hcl 200 Mg Tabs (Amiodarone hcl) .Marland Kitchen... Take one tablet by mouth daily    Carvedilol 6.25 Mg Tabs (Carvedilol) .Marland Kitchen... Take one tablet by mouth twice a day    Nitrostat 0.4 Mg Subl (Nitroglycerin) .Marland Kitchen... Place 1 tablet under the tounge every 5 minutes as needed for chest pain    Lisinopril 5 Mg Tabs (Lisinopril) .Marland Kitchen... Take one tablet by mouth daily  Problem # 2:  CAD, NATIVE VESSEL (ICD-414.01) No chest pain. Continue current meds. He refuses to take ASA as he had problems in the past on ASA. Will check fasting lipids, LFTs and BMET next week.   The following medications were removed from the medication list:    Aspirin  Ec 325 Mg Tbec (Aspirin) .Marland Kitchen... Take one half tablet by mouth daily His updated medication list for this problem includes:    Warfarin Sodium 5 Mg Tabs (Warfarin sodium) ..... Use as directed by anticoagulation clinic    Carvedilol 6.25 Mg Tabs (Carvedilol) .Marland Kitchen... Take one tablet by mouth twice a day    Nitrostat 0.4 Mg Subl (Nitroglycerin) .Marland Kitchen... Place 1 tablet under the tounge every 5 minutes as needed for chest pain    Lisinopril 5 Mg Tabs (Lisinopril) .Marland Kitchen... Take one tablet by mouth daily  The following medications were removed from the medication list:    Aspirin Ec 325 Mg Tbec (Aspirin) .Marland Kitchen... Take one half tablet by mouth daily His updated medication list for this problem includes:    Warfarin Sodium 5 Mg Tabs (Warfarin sodium) ..... Use as directed by anticoagulation clinic    Carvedilol 6.25 Mg Tabs (Carvedilol) .Marland Kitchen... Take one tablet by mouth twice a day    Nitrostat 0.4 Mg Subl (Nitroglycerin) .Marland Kitchen... Place 1 tablet under the tounge every 5 minutes as needed for chest pain    Lisinopril 5 Mg Tabs (Lisinopril) .Marland Kitchen... Take one tablet by mouth  daily  Orders: EKG w/ Interpretation (93000)  Problem # 3:  MURAL THROMBUS, APEX OF HEART (ICD-429.79) Continue coumadin as he is high risk for emoblic events with apical thrombus in past and akinetic apex of the LV.   Problem # 4:  HYPERTENSION (ICD-401.9) Reasonably well controlled. Will not make changes today.   The following medications were removed from the medication list:    Aspirin Ec 325 Mg Tbec (Aspirin) .Marland Kitchen... Take one half tablet by mouth daily His updated medication list for this problem includes:    Carvedilol 6.25 Mg Tabs (Carvedilol) .Marland Kitchen... Take one tablet by mouth twice a day    Lisinopril 5 Mg Tabs (Lisinopril) .Marland Kitchen... Take one tablet by mouth daily  The following medications were removed from the medication list:    Aspirin Ec 325 Mg Tbec (Aspirin) .Marland Kitchen... Take one half tablet by mouth daily His updated medication list for this problem includes:    Carvedilol 6.25 Mg Tabs (Carvedilol) .Marland Kitchen... Take one tablet by mouth twice a day    Lisinopril 5 Mg Tabs (Lisinopril) .Marland Kitchen... Take one tablet by mouth daily  Patient Instructions: 1)  Your physician recommends that you schedule a follow-up appointment in: 6 months 2)  Your physician recommends that you return for a FASTING lipid profile, BMP, & Liver Function test next week. 3)  Your physician recommends that you continue on your current medications as directed. Please refer to the Current Medication list given to you today.

## 2010-10-06 NOTE — Medication Information (Signed)
Summary: rov/cb  Anticoagulant Therapy  Managed by: Elaina Pattee, PharmD Referring MD: Clifton James MD, Cristal Deer PCP: Murray Hodgkins Supervising MD: Gala Romney MD, Reuel Boom Indication 1: Atrial Fibrillation Indication 2: DVT Lab Used: [facility] [place] Site: [address] INR POC 2.0 INR RANGE 2-3  Dietary changes: no    Health status changes: no    Bleeding/hemorrhagic complications: no    Recent/future hospitalizations: no    Any changes in medication regimen? no    Recent/future dental: no  Any missed doses?: no       Is patient compliant with meds? yes       Allergies (verified): 1)  Zestril  Anticoagulation Management History:      The patient is taking warfarin and comes in today for a routine follow up visit.  Positive risk factors for bleeding include an age of 75 years or older and presence of serious comorbidities.  The bleeding index is 'intermediate risk'.  Positive CHADS2 values include History of CHF, History of HTN, Age > 18 years old, and History of Diabetes.  Anticoagulation responsible provider: Bellamarie Pflug MD, Reuel Boom.  INR POC: 2.0.  Cuvette Lot#: [id number].  Exp: 01/2011.    Anticoagulation Management Assessment/Plan:      The patient's current anticoagulation dose is Warfarin sodium 5 mg tabs: Use as directed by Anticoagulation Clinic.  The target INR is 2.0-3.0.  The next INR is due 01/15/2010.  Anticoagulation instructions were given to patient/spouse.  Results were reviewed/authorized by Elaina Pattee, PharmD.  He was notified by nterbeck.         Prior Anticoagulation Instructions: INR 1.6. Take 1 tablet today, then take 0.5 tablet daily except 1 tablet on Tues, Thurs, Sat. Recheck in 7-10 days.  Current Anticoagulation Instructions: INR=2.0  will continue with present dosing schedule---1/2 tablet coumadin on sun,mon,wed,fri--1 tablet on tues,thur,saturday

## 2010-10-06 NOTE — Medication Information (Signed)
Summary: rov/ewj  Anticoagulant Therapy  Managed by: Elaina Pattee, PharmD Referring MD: Clifton James MD, Cristal Deer PCP: Murray Hodgkins Supervising MD: Daleen Squibb MD, Maisie Fus Indication 1: Atrial Fibrillation Indication 2: DVT Lab Used: [facility] [place] Site: [address] INR POC 1.6 INR RANGE 2-3  Dietary changes: no    Health status changes: no    Bleeding/hemorrhagic complications: no    Recent/future hospitalizations: no    Any changes in medication regimen? no    Recent/future dental: no  Any missed doses?: no       Is patient compliant with meds? yes       Allergies: 1)  Zestril  Anticoagulation Management History:      The patient is taking warfarin and comes in today for a routine follow up visit.  Positive risk factors for bleeding include an age of 5 years or older and presence of serious comorbidities.  The bleeding index is 'intermediate risk'.  Positive CHADS2 values include History of CHF, History of HTN, Age > 49 years old, and History of Diabetes.  Anticoagulation responsible provider: Daleen Squibb MD, Maisie Fus.  INR POC: 1.6.  Cuvette Lot#: [id number].  Exp: 01/2011.    Anticoagulation Management Assessment/Plan:      The patient's current anticoagulation dose is Warfarin sodium 5 mg tabs: Use as directed by Anticoagulation Clinic.  The target INR is 2.0-3.0.  The next INR is due 12/18/2009.  Anticoagulation instructions were given to patient/spouse.  Results were reviewed/authorized by Elaina Pattee, PharmD.  He was notified by Elaina Pattee, PharmD.         Prior Anticoagulation Instructions: INR 1.3  Take 1 tablet today and tomorrow then increase dosage to 1/2 tablet daily except 1 tablet on Wednesdays and Saturdays.  Recheck in 10 days.    Current Anticoagulation Instructions: INR 1.6. Take 1 tablet today, then take 0.5 tablet daily except 1 tablet on Tues, Thurs, Sat. Recheck in 7-10 days.

## 2010-10-06 NOTE — Medication Information (Signed)
Summary: rov/tm  Anticoagulant Therapy  Managed by: Cloyde Reams, RN, BSN Referring MD: Clifton James MD, Cristal Deer PCP: Murray Hodgkins Supervising MD: Juanda Chance MD, Bruce Indication 1: Atrial Fibrillation Indication 2: DVT Lab Used: [facility] [place] Site: [address] INR POC 1.3 INR RANGE 2-3  Dietary changes: no    Health status changes: no    Bleeding/hemorrhagic complications: no    Recent/future hospitalizations: no    Any changes in medication regimen? no    Recent/future dental: no  Any missed doses?: no       Is patient compliant with meds? yes      Comments: Pt brought all medications and pill box into office today for [place] to review.  He had all of the correct medications in his box except no Crestor and he is still taking furosemide.  He had both 5mg  and 1mg  tablets of Coumadin.  He could not remember how long he had been taking both.  I placed 5mg  of Coumadin in each remaining day and showed pt the tablet to make sure he takes the same thing next week.  I also placed Crestor in his box for the rest of the week. Since he has been taking furosemide for a while, will check BMET today to make sure renal function has not worsened.  I took the medications he was not suppose to take and placed those together with a note to leave those out.  He has a niece that helps with his medications.     Allergies: 1)  Zestril  Anticoagulation Management History:      The patient is taking warfarin and comes in today for a routine follow up visit.  Positive risk factors for bleeding include an age of 60 years or older and presence of serious comorbidities.  The bleeding index is 'intermediate risk'.  Positive CHADS2 values include History of CHF, History of HTN, Age > 89 years old, and History of Diabetes.  Anticoagulation responsible Vasil Juhasz: Juanda Chance MD, Smitty Cords.  INR POC: 1.3.  Exp: 06/2011.    Anticoagulation Management Assessment/Plan:      The patient's current  anticoagulation dose is Warfarin sodium 5 mg tabs: Use as directed by Anticoagulation Clinic.  The target INR is 2.0-3.0.  The next INR is due 04/14/2010.  Anticoagulation instructions were given to patient.  Results were reviewed/authorized by Cloyde Reams, RN, BSN.  He was notified by Weston Brass PharmD.         Prior Anticoagulation Instructions: INR 1.0  Take 4mg  daily until next week.   Bring medications to next visit.   Current Anticoagulation Instructions: INR 1.3  Take 5mg  daily (oblong peach tablet- has #5 on one side and barr on the other side)

## 2010-10-06 NOTE — Medication Information (Signed)
Summary: rov/tm  Anticoagulant Therapy  Managed by: Weston Brass, PharmD Referring MD: Clifton James MD, Cristal Deer PCP: Murray Hodgkins Supervising MD: Tenny Craw MD, Gunnar Fusi Indication 1: Atrial Fibrillation Indication 2: DVT Lab Used: [facility] [place] Site: [address] INR POC 1.1 INR RANGE 2-3  Dietary changes: no    Health status changes: no    Bleeding/hemorrhagic complications: no    Recent/future hospitalizations: no    Any changes in medication regimen? no    Recent/future dental: no  Any missed doses?: no       Is patient compliant with meds? yes      Comments: Pt is confused about which tablets he is supposed to be taking. His niece puts his medications in his pill box for him.   Allergies: 1)  Zestril  Anticoagulation Management History:      The patient is taking warfarin and comes in today for a routine follow up visit.  Positive risk factors for bleeding include an age of 75 years or older and presence of serious comorbidities.  The bleeding index is 'intermediate risk'.  Positive CHADS2 values include History of CHF, History of HTN, Age > 75 years old, and History of Diabetes.  Anticoagulation responsible provider: Tenny Craw MD, Gunnar Fusi.  INR POC: 1.1.  Cuvette Lot#: [id number].  Exp: 07/2011.    Anticoagulation Management Assessment/Plan:      The patient's current anticoagulation dose is Warfarin sodium 5 mg tabs: Use as directed by Anticoagulation Clinic.  The target INR is 2.0-3.0.  The next INR is due 06/08/2010.  Anticoagulation instructions were given to patient.  Results were reviewed/authorized by Weston Brass, PharmD.  He was notified by Weston Brass PharmD.        Coagulation management information includes: Wendy [phone].  Prior Anticoagulation Instructions: INR 2.2  Continue on same dosage 5mg  daily (OBLONG PEACH TABLET-has #5 on one side and barr on other side.  Recheck in 10 days.    Current Anticoagulation Instructions: INR  1.1  Spoke with pt's niece.  She verified pt has 5mg  tablets at home.  She is going to fix his medication box tomorrow.  Continue 5mg  daily.  Recheck INR in 1 week.

## 2010-10-06 NOTE — Progress Notes (Signed)
  Recieved Request form [employer] requesting Records forwarded to [facility] for processing. Cala Bradford Mesiemore  September 19, 2009 1:07 PM

## 2010-10-06 NOTE — Medication Information (Signed)
Summary: rov/ewj  Anticoagulant Therapy  Managed by: Lew Dawes, PharmD Candidate Referring MD: Clifton James MD, Cristal Deer PCP: Murray Hodgkins Supervising MD: Juanda Chance MD, Aliese Brannum Indication 1: Atrial Fibrillation Indication 2: DVT Lab Used: [facility] Site: [address] INR POC 2.6 INR RANGE 2-3  Dietary changes: no    Health status changes: no    Bleeding/hemorrhagic complications: no    Recent/future hospitalizations: no    Any changes in medication regimen? no    Recent/future dental: no  Any missed doses?: no       Is patient compliant with meds? yes       Allergies: 1)  Zestril  Anticoagulation Management History:      The patient is taking warfarin and comes in today for a routine follow up visit.  Positive risk factors for bleeding include an age of 75 years or older and presence of serious comorbidities.  The bleeding index is 'intermediate risk'.  Positive CHADS2 values include History of CHF, History of HTN, Age > 75 years old, and History of Diabetes.  Anticoagulation responsible provider: Juanda Chance MD, Smitty Cords.  INR POC: 2.6.  Cuvette Lot#: [id number].  Exp: 12/2010.    Anticoagulation Management Assessment/Plan:      The patient's current anticoagulation dose is Warfarin sodium 5 mg tabs: Use as directed by Anticoagulation Clinic.  The target INR is 2.0-3.0.  The next INR is due 10/22/2009.  Anticoagulation instructions were given to patient/spouse.  Results were reviewed/authorized by Lew Dawes, PharmD Candidate.  He was notified by Lew Dawes, PharmD Candidate.         Prior Anticoagulation Instructions: INR 1.6  Take 1 tablet today then start taking 1/2 tablet everyday.  Recheck in 2 weeks.    Current Anticoagulation Instructions: INR 2.6  Continue taking 2.5mg  (1/2 tablet) daily. Recheck in 3 weeks.

## 2010-10-20 DIAGNOSIS — Z7901 Long term (current) use of anticoagulants: Secondary | ICD-10-CM

## 2010-10-20 DIAGNOSIS — I82409 Acute embolism and thrombosis of unspecified deep veins of unspecified lower extremity: Secondary | ICD-10-CM

## 2010-10-20 DIAGNOSIS — I4891 Unspecified atrial fibrillation: Secondary | ICD-10-CM

## 2010-10-20 DIAGNOSIS — I238 Other current complications following acute myocardial infarction: Secondary | ICD-10-CM

## 2010-12-11 LAB — BASIC METABOLIC PANEL
BUN: 27 mg/dL — ABNORMAL HIGH (ref 6–23)
BUN: 32 mg/dL — ABNORMAL HIGH (ref 6–23)
BUN: 18 mg/dL (ref 6–23)
BUN: 19 mg/dL (ref 6–23)
BUN: 20 mg/dL (ref 6–23)
BUN: 25 mg/dL — ABNORMAL HIGH (ref 6–23)
CO2: 25 mEq/L (ref 19–32)
CO2: 26 mEq/L (ref 19–32)
CO2: 26 mEq/L (ref 19–32)
CO2: 26 mEq/L (ref 19–32)
CO2: 28 mEq/L (ref 19–32)
CO2: 29 mEq/L (ref 19–32)
CO2: 29 mEq/L (ref 19–32)
CO2: 31 mEq/L (ref 19–32)
Calcium: 9.6 mg/dL (ref 8.4–10.5)
Calcium: 8.2 mg/dL — ABNORMAL LOW (ref 8.4–10.5)
Calcium: 8.6 mg/dL (ref 8.4–10.5)
Calcium: 8.6 mg/dL (ref 8.4–10.5)
Calcium: 9.1 mg/dL (ref 8.4–10.5)
Calcium: 9.3 mg/dL (ref 8.4–10.5)
Calcium: 9.6 mg/dL (ref 8.4–10.5)
Chloride: 100 mEq/L (ref 96–112)
Chloride: 98 mEq/L (ref 96–112)
Chloride: 98 mEq/L (ref 96–112)
Chloride: 98 mEq/L (ref 96–112)
Chloride: 100 mEq/L (ref 96–112)
Chloride: 101 mEq/L (ref 96–112)
Chloride: 103 mEq/L (ref 96–112)
Chloride: 106 mEq/L (ref 96–112)
Chloride: 98 mEq/L (ref 96–112)
Creatinine, Ser: 1.88 mg/dL — ABNORMAL HIGH (ref 0.4–1.5)
Creatinine, Ser: 2.07 mg/dL — ABNORMAL HIGH (ref 0.4–1.5)
Creatinine, Ser: 1.54 mg/dL — ABNORMAL HIGH (ref 0.4–1.5)
Creatinine, Ser: 1.56 mg/dL — ABNORMAL HIGH (ref 0.4–1.5)
Creatinine, Ser: 1.57 mg/dL — ABNORMAL HIGH (ref 0.4–1.5)
Creatinine, Ser: 1.66 mg/dL — ABNORMAL HIGH (ref 0.4–1.5)
Creatinine, Ser: 1.86 mg/dL — ABNORMAL HIGH (ref 0.4–1.5)
Creatinine, Ser: 2.44 mg/dL — ABNORMAL HIGH (ref 0.4–1.5)
GFR calc Af Amer: 34 mL/min — ABNORMAL LOW (ref >60)
GFR calc Af Amer: 43 mL/min — ABNORMAL LOW (ref >60)
GFR calc Af Amer: 31 mL/min — ABNORMAL LOW (ref >60)
GFR calc Af Amer: 32 mL/min — ABNORMAL LOW (ref >60)
GFR calc Af Amer: 41 mL/min — ABNORMAL LOW (ref >60)
GFR calc Af Amer: 47 mL/min — ABNORMAL LOW (ref >60)
GFR calc Af Amer: 52 mL/min — ABNORMAL LOW (ref >60)
GFR calc Af Amer: 52 mL/min — ABNORMAL LOW (ref >60)
GFR calc Af Amer: 54 mL/min — ABNORMAL LOW (ref >60)
GFR calc non Af Amer: 28 mL/min — ABNORMAL LOW (ref >60)
GFR calc non Af Amer: 29 mL/min — ABNORMAL LOW (ref >60)
GFR calc non Af Amer: 31 mL/min — ABNORMAL LOW (ref >60)
GFR calc non Af Amer: 26 mL/min — ABNORMAL LOW (ref >60)
GFR calc non Af Amer: 26 mL/min — ABNORMAL LOW (ref >60)
GFR calc non Af Amer: 29 mL/min — ABNORMAL LOW (ref >60)
GFR calc non Af Amer: 35 mL/min — ABNORMAL LOW (ref >60)
GFR calc non Af Amer: 38 mL/min — ABNORMAL LOW (ref >60)
GFR calc non Af Amer: 40 mL/min — ABNORMAL LOW (ref >60)
GFR calc non Af Amer: 45 mL/min — ABNORMAL LOW (ref >60)
Glucose, Bld: 107 mg/dL — ABNORMAL HIGH (ref 70–99)
Glucose, Bld: 110 mg/dL — ABNORMAL HIGH (ref 70–99)
Glucose, Bld: 96 mg/dL (ref 70–99)
Glucose, Bld: 129 mg/dL — ABNORMAL HIGH (ref 70–99)
Glucose, Bld: 247 mg/dL — ABNORMAL HIGH (ref 70–99)
Glucose, Bld: 99 mg/dL (ref 70–99)
Potassium: 3.8 mEq/L (ref 3.5–5.1)
Potassium: 4.2 mEq/L (ref 3.5–5.1)
Potassium: 3.1 mEq/L — ABNORMAL LOW (ref 3.5–5.1)
Potassium: 3.3 mEq/L — ABNORMAL LOW (ref 3.5–5.1)
Potassium: 3.9 mEq/L (ref 3.5–5.1)
Potassium: 4.1 mEq/L (ref 3.5–5.1)
Potassium: 4.2 mEq/L (ref 3.5–5.1)
Sodium: 135 mEq/L (ref 135–145)
Sodium: 135 mEq/L (ref 135–145)
Sodium: 138 mEq/L (ref 135–145)
Sodium: 138 mEq/L (ref 135–145)
Sodium: 134 mEq/L — ABNORMAL LOW (ref 135–145)
Sodium: 136 mEq/L (ref 135–145)
Sodium: 138 mEq/L (ref 135–145)
Sodium: 139 mEq/L (ref 135–145)
Sodium: 140 mEq/L (ref 135–145)
Sodium: 140 mEq/L (ref 135–145)
Sodium: 141 mEq/L (ref 135–145)

## 2010-12-11 LAB — BRAIN NATRIURETIC PEPTIDE
Pro B Natriuretic peptide (BNP): 1210 pg/mL — ABNORMAL HIGH (ref 0.0–100.0)
Pro B Natriuretic peptide (BNP): 1306 pg/mL — ABNORMAL HIGH (ref 0.0–100.0)
Pro B Natriuretic peptide (BNP): 1456 pg/mL — ABNORMAL HIGH (ref 0.0–100.0)
Pro B Natriuretic peptide (BNP): 2624 pg/mL — ABNORMAL HIGH (ref 0.0–100.0)
Pro B Natriuretic peptide (BNP): 2882 pg/mL — ABNORMAL HIGH (ref 0.0–100.0)

## 2010-12-11 LAB — GLUCOSE, CAPILLARY
Glucose-Capillary: 109 mg/dL — ABNORMAL HIGH (ref 70–99)
Glucose-Capillary: 111 mg/dL — ABNORMAL HIGH (ref 70–99)
Glucose-Capillary: 116 mg/dL — ABNORMAL HIGH (ref 70–99)
Glucose-Capillary: 128 mg/dL — ABNORMAL HIGH (ref 70–99)
Glucose-Capillary: 129 mg/dL — ABNORMAL HIGH (ref 70–99)
Glucose-Capillary: 139 mg/dL — ABNORMAL HIGH (ref 70–99)
Glucose-Capillary: 140 mg/dL — ABNORMAL HIGH (ref 70–99)
Glucose-Capillary: 88 mg/dL (ref 70–99)
Glucose-Capillary: 98 mg/dL (ref 70–99)
Glucose-Capillary: 99 mg/dL (ref 70–99)
Glucose-Capillary: 102 mg/dL — ABNORMAL HIGH (ref 70–99)
Glucose-Capillary: 109 mg/dL — ABNORMAL HIGH (ref 70–99)
Glucose-Capillary: 109 mg/dL — ABNORMAL HIGH (ref 70–99)
Glucose-Capillary: 116 mg/dL — ABNORMAL HIGH (ref 70–99)
Glucose-Capillary: 117 mg/dL — ABNORMAL HIGH (ref 70–99)
Glucose-Capillary: 119 mg/dL — ABNORMAL HIGH (ref 70–99)
Glucose-Capillary: 119 mg/dL — ABNORMAL HIGH (ref 70–99)
Glucose-Capillary: 124 mg/dL — ABNORMAL HIGH (ref 70–99)
Glucose-Capillary: 126 mg/dL — ABNORMAL HIGH (ref 70–99)
Glucose-Capillary: 128 mg/dL — ABNORMAL HIGH (ref 70–99)
Glucose-Capillary: 132 mg/dL — ABNORMAL HIGH (ref 70–99)
Glucose-Capillary: 142 mg/dL — ABNORMAL HIGH (ref 70–99)
Glucose-Capillary: 159 mg/dL — ABNORMAL HIGH (ref 70–99)
Glucose-Capillary: 177 mg/dL — ABNORMAL HIGH (ref 70–99)
Glucose-Capillary: 78 mg/dL (ref 70–99)
Glucose-Capillary: 96 mg/dL (ref 70–99)
Glucose-Capillary: 97 mg/dL (ref 70–99)

## 2010-12-11 LAB — CBC
HCT: 35.1 % — ABNORMAL LOW (ref 39.0–52.0)
HCT: 31.1 % — ABNORMAL LOW (ref 39.0–52.0)
HCT: 31.4 % — ABNORMAL LOW (ref 39.0–52.0)
HCT: 33 % — ABNORMAL LOW (ref 39.0–52.0)
HCT: 34.2 % — ABNORMAL LOW (ref 39.0–52.0)
HCT: 35.5 % — ABNORMAL LOW (ref 39.0–52.0)
Hemoglobin: 11.5 g/dL — ABNORMAL LOW (ref 13.0–17.0)
Hemoglobin: 10.4 g/dL — ABNORMAL LOW (ref 13.0–17.0)
Hemoglobin: 10.6 g/dL — ABNORMAL LOW (ref 13.0–17.0)
Hemoglobin: 10.6 g/dL — ABNORMAL LOW (ref 13.0–17.0)
Hemoglobin: 11.3 g/dL — ABNORMAL LOW (ref 13.0–17.0)
Hemoglobin: 11.3 g/dL — ABNORMAL LOW (ref 13.0–17.0)
Hemoglobin: 11.8 g/dL — ABNORMAL LOW (ref 13.0–17.0)
MCHC: 33.2 g/dL (ref 30.0–36.0)
MCHC: 33.6 g/dL (ref 30.0–36.0)
MCHC: 33.6 g/dL (ref 30.0–36.0)
MCHC: 33.7 g/dL (ref 30.0–36.0)
MCHC: 34.2 g/dL (ref 30.0–36.0)
MCHC: 34.7 g/dL (ref 30.0–36.0)
MCV: 98.2 fL (ref 78.0–100.0)
MCV: 99.2 fL (ref 78.0–100.0)
MCV: 101.1 fL — ABNORMAL HIGH (ref 78.0–100.0)
MCV: 101.8 fL — ABNORMAL HIGH (ref 78.0–100.0)
MCV: 102.2 fL — ABNORMAL HIGH (ref 78.0–100.0)
MCV: 99.2 fL (ref 78.0–100.0)
Platelets: 284 10*3/uL (ref 150–400)
Platelets: 294 10*3/uL (ref 150–400)
Platelets: 297 10*3/uL (ref 150–400)
Platelets: 157 10*3/uL (ref 150–400)
Platelets: 159 10*3/uL (ref 150–400)
Platelets: 242 10*3/uL (ref 150–400)
RBC: 3.28 MIL/uL — ABNORMAL LOW (ref 4.22–5.81)
RBC: 3.58 MIL/uL — ABNORMAL LOW (ref 4.22–5.81)
RBC: 3.13 MIL/uL — ABNORMAL LOW (ref 4.22–5.81)
RBC: 3.13 MIL/uL — ABNORMAL LOW (ref 4.22–5.81)
RBC: 3.17 MIL/uL — ABNORMAL LOW (ref 4.22–5.81)
RBC: 3.27 MIL/uL — ABNORMAL LOW (ref 4.22–5.81)
RBC: 3.3 MIL/uL — ABNORMAL LOW (ref 4.22–5.81)
RBC: 3.31 MIL/uL — ABNORMAL LOW (ref 4.22–5.81)
RDW: 14.6 % (ref 11.5–15.5)
RDW: 14 % (ref 11.5–15.5)
RDW: 14.2 % (ref 11.5–15.5)
RDW: 14.3 % (ref 11.5–15.5)
RDW: 14.3 % (ref 11.5–15.5)
RDW: 14.6 % (ref 11.5–15.5)
WBC: 9.4 10*3/uL (ref 4.0–10.5)
WBC: 6.8 10*3/uL (ref 4.0–10.5)
WBC: 6.9 10*3/uL (ref 4.0–10.5)
WBC: 6.9 10*3/uL (ref 4.0–10.5)
WBC: 7 10*3/uL (ref 4.0–10.5)
WBC: 7.3 10*3/uL (ref 4.0–10.5)
WBC: 8.2 10*3/uL (ref 4.0–10.5)

## 2010-12-11 LAB — PROTIME-INR
INR: 2.3 — ABNORMAL HIGH (ref 0.00–1.49)
INR: 3 — ABNORMAL HIGH (ref 0.00–1.49)
INR: 3.5 — ABNORMAL HIGH (ref 0.00–1.49)
INR: 1 (ref 0.00–1.49)
INR: 1.1 (ref 0.00–1.49)
INR: 1.1 (ref 0.00–1.49)
INR: 1.3 (ref 0.00–1.49)
Prothrombin Time: 19.2 seconds — ABNORMAL HIGH (ref 11.6–15.2)
Prothrombin Time: 25.2 seconds — ABNORMAL HIGH (ref 11.6–15.2)
Prothrombin Time: 31.1 seconds — ABNORMAL HIGH (ref 11.6–15.2)
Prothrombin Time: 34.5 seconds — ABNORMAL HIGH (ref 11.6–15.2)
Prothrombin Time: 13.8 seconds (ref 11.6–15.2)
Prothrombin Time: 14 seconds (ref 11.6–15.2)

## 2010-12-11 LAB — CARDIAC PANEL(CRET KIN+CKTOT+MB+TROPI)
Relative Index: INVALID (ref 0.0–2.5)
Total CK: 62 U/L (ref 7–232)

## 2010-12-11 LAB — LIPID PANEL
Cholesterol: 150 mg/dL (ref 0–200)
Cholesterol: 156 mg/dL (ref 0–200)
HDL: 79 mg/dL (ref >39)
LDL Cholesterol: 63 mg/dL (ref 0–99)
LDL Cholesterol: 65 mg/dL (ref 0–99)
Total CHOL/HDL Ratio: 2 RATIO
Total CHOL/HDL Ratio: 2.1 RATIO
Triglycerides: 84 mg/dL (ref <150)
VLDL: 12 mg/dL (ref 0–40)

## 2010-12-11 LAB — URINALYSIS, ROUTINE W REFLEX MICROSCOPIC
Glucose, UA: NEGATIVE mg/dL
Nitrite: NEGATIVE
Nitrite: POSITIVE — AB
Protein, ur: 100 mg/dL — AB
Specific Gravity, Urine: 1.017 (ref 1.005–1.030)
Specific Gravity, Urine: >1.046 — ABNORMAL HIGH (ref 1.005–1.030)
Urobilinogen, UA: 1 mg/dL (ref 0.0–1.0)
pH: 6.5 (ref 5.0–8.0)

## 2010-12-11 LAB — PHOSPHORUS
Phosphorus: 4.6 mg/dL (ref 2.3–4.6)
Phosphorus: 4.6 mg/dL (ref 2.3–4.6)
Phosphorus: 4.8 mg/dL — ABNORMAL HIGH (ref 2.3–4.6)
Phosphorus: 5.2 mg/dL — ABNORMAL HIGH (ref 2.3–4.6)
Phosphorus: 3.7 mg/dL (ref 2.3–4.6)
Phosphorus: 4.2 mg/dL (ref 2.3–4.6)
Phosphorus: 4.5 mg/dL (ref 2.3–4.6)
Phosphorus: 4.6 mg/dL (ref 2.3–4.6)
Phosphorus: 4.9 mg/dL — ABNORMAL HIGH (ref 2.3–4.6)

## 2010-12-11 LAB — APTT
aPTT: 83 seconds — ABNORMAL HIGH (ref 24–37)
aPTT: 90 seconds — ABNORMAL HIGH (ref 24–37)

## 2010-12-11 LAB — TROPONIN I: Troponin I: 0.4 ng/mL — ABNORMAL HIGH (ref 0.00–0.06)

## 2010-12-11 LAB — URINE MICROSCOPIC-ADD ON

## 2010-12-11 LAB — MAGNESIUM
Magnesium: 2.1 mg/dL (ref 1.5–2.5)
Magnesium: 2.1 mg/dL (ref 1.5–2.5)
Magnesium: 1.6 mg/dL (ref 1.5–2.5)
Magnesium: 1.8 mg/dL (ref 1.5–2.5)
Magnesium: 2.3 mg/dL (ref 1.5–2.5)
Magnesium: 2.5 mg/dL (ref 1.5–2.5)

## 2010-12-11 LAB — HEMOGLOBIN A1C: Mean Plasma Glucose: 105 mg/dL

## 2010-12-11 LAB — IRON: Iron: 20 ug/dL — ABNORMAL LOW (ref 42–135)

## 2010-12-11 LAB — DIFFERENTIAL
Eosinophils Absolute: 0 10*3/uL (ref 0.0–0.7)
Eosinophils Relative: 0 % (ref 0–5)
Lymphs Abs: 1 10*3/uL (ref 0.7–4.0)

## 2010-12-11 LAB — POCT I-STAT 3, VENOUS BLOOD GAS (G3P V)
Acid-Base Excess: 1 mmol/L (ref 0.0–2.0)
Bicarbonate: 24.4 mEq/L — ABNORMAL HIGH (ref 20.0–24.0)
O2 Saturation: 47 %
TCO2: 25 mmol/L (ref 0–100)
pCO2, Ven: 33.9 mmHg — ABNORMAL LOW (ref 45.0–50.0)
pH, Ven: 7.465 — ABNORMAL HIGH (ref 7.250–7.300)

## 2010-12-11 LAB — HEPARIN LEVEL (UNFRACTIONATED)
Heparin Unfractionated: 0.49 IU/mL (ref 0.30–0.70)
Heparin Unfractionated: 0.33 IU/mL (ref 0.30–0.70)
Heparin Unfractionated: 0.43 IU/mL (ref 0.30–0.70)
Heparin Unfractionated: 0.59 IU/mL (ref 0.30–0.70)
Heparin Unfractionated: 0.69 IU/mL (ref 0.30–0.70)
Heparin Unfractionated: 0.79 IU/mL — ABNORMAL HIGH (ref 0.30–0.70)

## 2010-12-11 LAB — URINE CULTURE
Colony Count: NO GROWTH
Culture: NO GROWTH

## 2010-12-11 LAB — CK TOTAL AND CKMB (NOT AT ARMC)
CK, MB: 2.8 ng/mL (ref 0.3–4.0)
Relative Index: INVALID (ref 0.0–2.5)

## 2010-12-11 LAB — FERRITIN: Ferritin: 1245 ng/mL — ABNORMAL HIGH (ref 22–322)

## 2010-12-11 LAB — POCT I-STAT 3, ART BLOOD GAS (G3+)
pCO2 arterial: 28 mmHg — ABNORMAL LOW (ref 35.0–45.0)
pH, Arterial: 7.536 — ABNORMAL HIGH (ref 7.350–7.450)

## 2011-01-25 ENCOUNTER — Ambulatory Visit (INDEPENDENT_AMBULATORY_CARE_PROVIDER_SITE_OTHER): Payer: 59 | Admitting: *Deleted

## 2011-01-25 DIAGNOSIS — I238 Other current complications following acute myocardial infarction: Secondary | ICD-10-CM

## 2011-01-25 DIAGNOSIS — I4891 Unspecified atrial fibrillation: Secondary | ICD-10-CM

## 2011-01-25 DIAGNOSIS — I82409 Acute embolism and thrombosis of unspecified deep veins of unspecified lower extremity: Secondary | ICD-10-CM

## 2011-01-25 DIAGNOSIS — Z7901 Long term (current) use of anticoagulants: Secondary | ICD-10-CM

## 2011-01-26 ENCOUNTER — Telehealth: Payer: Self-pay

## 2011-01-26 MED ORDER — CARVEDILOL 6.25 MG PO TABS: 6.2500 mg | ORAL_TABLET | Freq: Two times a day (BID) | ORAL | Status: DC

## 2011-01-26 NOTE — Progress Notes (Signed)
Spoke with Dr. Clifton James.  Pt having a hard time remembering his medications.  He has been non-compliant with follow-up visits and INR has been subtherapeutic since last fall.  Dr. Clifton James agrees to switch patient from Coumadin to Aspirin 325mg .  Spoke with pt's sister, Delorise Royals.  She has been checking on her brother every day but he has a niece that does his medication boxes.  Pt tells her he takes his medication each day but niece states there are many days that he has missed some of his medications.  The sister is going to go to his house today and check all of his medications.  She will call us if she needs refills on anything.  Pt is also overdue for f/u with MD.  Rhina Brackett recall letter to address listed in chart.  Sister will make sure pt makes appt ASAP.

## 2011-01-26 NOTE — Telephone Encounter (Signed)
Joel Walker PharmD spoke with Dr Joel Walker regarding discontinuing Coumadin secondary to non-compliance and delinquent follow-up visits.  Pt last INR 05/2010.  Dr Joel Walker agreed to d/c Coumadin and start ASA.  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 [name], Pharm D advised Dr Joel Walker he is aware pt refuses to start ASA. Sister went to home to verify medications pt currently has in home, called back per Joel Walker, PharmD we were to send in rx for any missing medications on his med list.  Pt is currently taking, and has all rx medications on med list except Carvedilol. Rx sent electronically to pharmacy, pt's sister aware.  Pt also has Levothyroxine with his medications rx by Dr Eulah Citizen, advised pt's sister to call Dr Paralee Cancel office to verify pt should still be taking and is having labwork done appropriately to monitor.

## 2011-07-04 ENCOUNTER — Other Ambulatory Visit: Payer: Self-pay | Admitting: Cardiovascular Disease

## 2011-08-14 ENCOUNTER — Other Ambulatory Visit: Payer: Self-pay | Admitting: Cardiovascular Disease

## 2011-09-30 ENCOUNTER — Other Ambulatory Visit: Payer: Self-pay | Admitting: Cardiovascular Disease

## 2011-10-05 ENCOUNTER — Telehealth: Payer: Self-pay | Admitting: Cardiovascular Disease

## 2011-10-05 ENCOUNTER — Other Ambulatory Visit: Payer: Self-pay | Admitting: *Deleted

## 2011-10-05 MED ORDER — AMIODARONE HCL 200 MG PO TABS: 200.0000 mg | ORAL_TABLET | Freq: Every day | ORAL | Status: DC

## 2011-10-05 NOTE — Telephone Encounter (Signed)
New msg: Pt needs refill of amidodarone HCL 200mg  called into [facility] on [address]. Pt has zero pills left. Pt refill request was denied because pt hasn't seen MD recently. Pt was scheduled to see Dr. Clifton James 10/12/11. However pt needs medication for the days leading up to pt appt with MD.   Please call pt sister to inform that RX was called in.

## 2011-10-05 NOTE — Telephone Encounter (Signed)
Pt called and informed med was refilled till app

## 2011-10-11 ENCOUNTER — Encounter: Payer: Self-pay | Admitting: *Deleted

## 2011-10-12 ENCOUNTER — Ambulatory Visit (INDEPENDENT_AMBULATORY_CARE_PROVIDER_SITE_OTHER): Payer: 59 | Admitting: Cardiovascular Disease

## 2011-10-12 ENCOUNTER — Telehealth: Payer: Self-pay | Admitting: Cardiovascular Disease

## 2011-10-12 ENCOUNTER — Encounter: Payer: Self-pay | Admitting: Cardiovascular Disease

## 2011-10-12 VITALS — BP 155/80 | HR 64 | Ht 70.0 in | Wt 217.0 lb

## 2011-10-12 DIAGNOSIS — I238 Other current complications following acute myocardial infarction: Secondary | ICD-10-CM

## 2011-10-12 DIAGNOSIS — I2510 Atherosclerotic heart disease of native coronary artery without angina pectoris: Secondary | ICD-10-CM

## 2011-10-12 DIAGNOSIS — I82409 Acute embolism and thrombosis of unspecified deep veins of unspecified lower extremity: Secondary | ICD-10-CM

## 2011-10-12 DIAGNOSIS — I2589 Other forms of chronic ischemic heart disease: Secondary | ICD-10-CM

## 2011-10-12 DIAGNOSIS — I4891 Unspecified atrial fibrillation: Secondary | ICD-10-CM

## 2011-10-12 DIAGNOSIS — I5022 Chronic systolic (congestive) heart failure: Secondary | ICD-10-CM

## 2011-10-12 MED ORDER — ASPIRIN EC 81 MG PO TBEC: 81.0000 mg | DELAYED_RELEASE_TABLET | Freq: Every day | ORAL | Status: AC

## 2011-10-12 MED ORDER — FUROSEMIDE 20 MG PO TABS: 20.0000 mg | ORAL_TABLET | Freq: Every day | ORAL | Status: DC

## 2011-10-12 MED ORDER — METFORMIN HCL ER 750 MG PO TB24: ORAL_TABLET | ORAL | Status: DC

## 2011-10-12 MED ORDER — FERROUS SULFATE 325 (65 FE) MG PO TABS: 325.0000 mg | ORAL_TABLET | Freq: Every day | ORAL | Status: DC

## 2011-10-12 NOTE — Assessment & Plan Note (Addendum)
Continue medical therapy with beta blocker and Ace-inh.

## 2011-10-12 NOTE — Assessment & Plan Note (Signed)
Maintaining NSR. He is not on coumadin secondary to non-compliance.

## 2011-10-12 NOTE — Telephone Encounter (Signed)
Will update med list

## 2011-10-12 NOTE — Assessment & Plan Note (Signed)
Not on coumadin secondary to non-compliance.

## 2011-10-12 NOTE — Assessment & Plan Note (Signed)
He is known to have severe CAD as described in HPI. He refuses to take an ASA. Will continue conservative management.

## 2011-10-12 NOTE — Patient Instructions (Addendum)
Your physician wants you to follow-up in: 6 months. You will receive a reminder letter in the mail two months in advance. If you don't receive a letter, please call our office to schedule the follow-up appointment.  Your physician has recommended you make the following change in your medication: Start furosemide 20 mg by mouth daily.  Call us with a list of your medications.

## 2011-10-12 NOTE — Assessment & Plan Note (Signed)
He has some LE edema and mild SOB with exertion. Will add Lasix 20 mg po Qdaily.

## 2011-10-12 NOTE — Assessment & Plan Note (Signed)
He had been on coumadin but it was stopped secondary to non-compliance.

## 2011-10-12 NOTE — Progress Notes (Signed)
History of Present Illness: 78 yo WM with history of CAD, chronic systolic CHF, ischemic CM, LV apical thrombus, DVT, PAF here today for cardiac follow up. I have not seen him since September 2011. He was admitted to [facility] in  September 2009 with a NSTEMI and was found to have severe LV dysfunction with EF of 20%, non-viable myocardium in anterior and inferior walls with high degree stenosis in a Ramus Intermediate branch and viability in the lateral wall now s/p bare metal stent to the Ramus Intermediate artery.  He was also found to have an LV apical thrombus and chronic DVT in right  lower extremity for which he was placed on coumadin for anticoagulation. He is also known to have  paroxysmal atrial fibrillation. His coumadin was stopped in May 2012 because of non-compliance with his medications and non-compliance with f/u visits for INR. He refused to take ASA because of prior issues with ASA. He has not been on ASA or coumadin over the last 9 months.    He tells me that he has been feeling well without any SOB, CP, palpitations, near syncope or syncope.  He had no dizziness or vertigo.  Pt has continued gait instability and with his severe cardiomyopathy, he has weakness and generalized fatigue. He has not fallen at home.  He tells me that he has been feeling well.   Past Medical History  Diagnosis Date  . SYSTOLIC HEART FAILURE, ACUTE   . PULMONARY HYPERTENSION   . PAROXYSMAL ATRIAL FIBRILLATION   . HYPERTENSION   . DVT   . CARDIOMYOPATHY   . CAD, NATIVE VESSEL   . TOBACCO USE, QUIT   . RENAL DISEASE, CHRONIC, STAGE III   . MURAL THROMBUS, APEX OF HEART   . Long term current use of anticoagulant   . HYPOKALEMIA   . DM   . ANEMIA, IRON DEFICIENCY     Past Surgical History  Procedure Date  . Cholecystectomy   . Placement of a veriflex bare-metal sten     Current Outpatient Prescriptions  Medication Sig Dispense Refill  . amiodarone (PACERONE) 200 MG tablet Take 1  tablet (200 mg total) by mouth daily.  30 tablet  1  . PRESCRIPTION MEDICATION Metformin daily      . carvedilol (COREG) 6.25 MG tablet TAKE 1 TABLET BY MOUTH TWICE A DAY  60 tablet  1  . KLOR-CON M20 20 MEQ tablet TAKE 1 TABLET EVERY DAY  30 tablet  1  . lisinopril (PRINIVIL,ZESTRIL) 5 MG tablet TAKE 1 TABLET BY MOUTH EVERY DAY  30 tablet  1    Allergies  Allergen Reactions  . Lisinopril     REACTION: Cough    History   Social History  . Marital Status: Single    Spouse Name: N/A    Number of Children: N/A  . Years of Education: N/A   Occupational History  . Not on file.   Social History Main Topics  . Smoking status: Not on file  . Smokeless tobacco: Not on file  . Alcohol Use: Not on file  . Drug Use: Not on file  . Sexually Active: Not on file   Other Topics Concern  . Not on file   Social History Narrative   Lives in [place] with his girlfriend who he has been with for over 40 years.  He has a 100 pack-year smoking history but quit  in 1996.  He denies the use of alcohol or the use  of illicit drugs.    No family history on file.  Review of Systems:  As stated in the HPI and otherwise negative.   BP 155/80  Pulse 64  Ht 5\' 10"  (1.778 m)  Wt 217 lb (98.431 kg)  BMI 31.14 kg/m2  Physical Examination: General: Well developed, well nourished, NAD HEENT: OP clear, mucus membranes moist SKIN: warm, dry. No rashes. Neuro: No focal deficits Musculoskeletal: Muscle strength 5/5 all ext Psychiatric: Mood and affect normal Neck: No JVD, no carotid bruits, no thyromegaly, no lymphadenopathy. Lungs:Clear bilaterally, no wheezes, rhonci, crackles Cardiovascular: Regular rate and rhythm. No murmurs, gallops or rubs. Abdomen:Soft. Bowel sounds present. Non-tender.  Extremities: No lower extremity edema. Pulses are 2 + in the bilateral DP/PT.  EKG: NSR with 1st degree AV block. Poor R wave progression through the precordial leads. Possible old inferior infarct.

## 2011-10-12 NOTE — Telephone Encounter (Signed)
Amiodarone 200mg  qd  Carvedilol 625mg  bid  metaformin - ER 750mg  bid  Lisinopril 5mg  qd  Klor-con qd  Baby ASA qd  Iron pill qd  Lasix was called in today at appt and she will go pick up tonight

## 2011-10-29 ENCOUNTER — Other Ambulatory Visit: Payer: Self-pay | Admitting: Cardiovascular Disease

## 2011-11-29 ENCOUNTER — Other Ambulatory Visit: Payer: Self-pay | Admitting: Cardiovascular Disease

## 2011-12-04 ENCOUNTER — Other Ambulatory Visit: Payer: Self-pay | Admitting: Cardiovascular Disease

## 2012-04-12 ENCOUNTER — Other Ambulatory Visit: Payer: Self-pay | Admitting: Cardiovascular Disease

## 2012-04-25 ENCOUNTER — Encounter: Payer: Self-pay | Admitting: Cardiovascular Disease

## 2012-04-25 ENCOUNTER — Ambulatory Visit (INDEPENDENT_AMBULATORY_CARE_PROVIDER_SITE_OTHER): Payer: Medicare Other | Admitting: Cardiovascular Disease

## 2012-04-25 VITALS — BP 123/73 | HR 68 | Ht 70.0 in | Wt 206.0 lb

## 2012-04-25 DIAGNOSIS — I2510 Atherosclerotic heart disease of native coronary artery without angina pectoris: Secondary | ICD-10-CM

## 2012-04-25 NOTE — Progress Notes (Signed)
History of Present Illness: 78 yo WM with history of CAD, chronic systolic CHF, ischemic CM, LV apical thrombus, DVT, PAF here today for cardiac follow up. I have not seen him since February 2013. He was admitted to [facility] in September 2009 with a NSTEMI and was found to have severe LV dysfunction with EF of 20%, non-viable myocardium in anterior and inferior walls with high degree stenosis in a Ramus Intermediate branch and viability in the lateral wall now s/p bare metal stent to the Ramus Intermediate artery. He was also found to have an LV apical thrombus and chronic DVT in right lower extremity for which he was placed on coumadin for anticoagulation. He is also known to have paroxysmal atrial fibrillation. His coumadin was stopped in May 2012 because of non-compliance with his medications and non-compliance with f/u visits for INR. He refused to take ASA because of prior issues with ASA. He has not been on ASA or coumadin since then.   He is here today for follow up. He tells me that he has been feeling well without any SOB, CP, palpitations, near syncope or syncope. He had no dizziness or vertigo. Pt has continued gait instability and with his severe cardiomyopathy. He has weakness and generalized fatigue. His sister is with him today. He has not fallen at home. He tells me that he has been feeling well. He refuses to use a wheelchair.   Primary Care Physician:  Murray Hodgkins  Last Lipid Profile: Followed in primary care. Last lipids here: Lipid Panel     Component Value Date/Time   CHOL 200 05/22/2010 0945   TRIG 221.0* 05/22/2010 0945   HDL 46.60 05/22/2010 0945   CHOLHDL 4 05/22/2010 0945   VLDL 44.2* 05/22/2010 0945   LDLCALC  Value: 63        Total Cholesterol/HDL:CHD Risk Coronary Heart Disease Risk Table                     Men   Women  1/2 Average Risk   3.4   3.3  Average Risk       5.0   4.4  2 X Average Risk   9.6   7.1  3 X Average Risk  23.4   11.0        Use the  calculated Patient Ratio above and the CHD Risk Table to determine the patient's CHD Risk.        ATP III CLASSIFICATION (LDL):  <100     mg/dL   Optimal  098-119  mg/dL   Near or Above                    Optimal  130-159  mg/dL   Borderline  147-829  mg/dL   High  >562     mg/dL   Very High 09/09/863 7846     Past Medical History  Diagnosis Date  . SYSTOLIC HEART FAILURE, ACUTE   . PULMONARY HYPERTENSION   . PAROXYSMAL ATRIAL FIBRILLATION   . HYPERTENSION   . DVT   . CARDIOMYOPATHY   . CAD, NATIVE VESSEL   . TOBACCO USE, QUIT   . RENAL DISEASE, CHRONIC, STAGE III   . MURAL THROMBUS, APEX OF HEART   . Long term current use of anticoagulant   . HYPOKALEMIA   . DM   . ANEMIA, IRON DEFICIENCY     Past Surgical History  Procedure Date  . Cholecystectomy   .  Placement of a veriflex bare-metal sten     Current Outpatient Prescriptions  Medication Sig Dispense Refill  . amiodarone (PACERONE) 200 MG tablet TAKE 1 TABLET BY MOUTH EVERY DAY  30 tablet  3  . aspirin EC 81 MG tablet Take 1 tablet (81 mg total) by mouth daily.  150 tablet  2  . carvedilol (COREG) 6.25 MG tablet TAKE 1 TABLET BY MOUTH TWICE A DAY  60 tablet  6  . ferrous sulfate 325 (65 FE) MG tablet Take 1 tablet (325 mg total) by mouth daily with breakfast.  30 tablet  11  . furosemide (LASIX) 20 MG tablet Take 40 mg by mouth daily.      Marland Kitchen KLOR-CON M20 20 MEQ tablet TAKE 1 TABLET EVERY DAY  30 tablet  5  . levothyroxine (SYNTHROID, LEVOTHROID) 50 MCG tablet Take 50 mcg by mouth daily.      Marland Kitchen lisinopril (PRINIVIL,ZESTRIL) 5 MG tablet TAKE 1 TABLET BY MOUTH EVERY DAY  30 tablet  5  . metFORMIN (GLUCOPHAGE-XR) 750 MG 24 hr tablet Take one tablet by mouth twice daily  60 tablet  6  . nitroGLYCERIN (NITROSTAT) 0.4 MG SL tablet Place 0.4 mg under the tongue every 5 (five) minutes as needed.      Marland Kitchen DISCONTD: furosemide (LASIX) 20 MG tablet Take 1 tablet (20 mg total) by mouth daily.  30 tablet  11  . DISCONTD: amiodarone  (PACERONE) 200 MG tablet TAKE 1 TABLET BY MOUTH EVERY DAY  30 tablet  5    Allergies  Allergen Reactions  . Lisinopril     REACTION: Cough    History   Social History  . Marital Status: Single    Spouse Name: N/A    Number of Children: N/A  . Years of Education: N/A   Occupational History  . Not on file.   Social History Main Topics  . Smoking status: Former Games developer  . Smokeless tobacco: Not on file  . Alcohol Use: Not on file  . Drug Use: Not on file  . Sexually Active: Not on file   Other Topics Concern  . Not on file   Social History Narrative   Lives in [place] with his girlfriend who he has been with for over 40 years.  He has a 100 pack-year smoking history but quit  in 1996.  He denies the use of alcohol or the use of illicit drugs.    No family history on file.  Review of Systems:  As stated in the HPI and otherwise negative.   BP 123/73  Pulse 68  Ht 5\' 10"  (1.778 m)  Wt 206 lb (93.441 kg)  BMI 29.56 kg/m2  Physical Examination: General: Well developed, well nourished, NAD HEENT: OP clear, mucus membranes moist SKIN: warm, dry. No rashes. Neuro: No focal deficits Musculoskeletal: Muscle strength 5/5 all ext Psychiatric: Mood and affect normal Neck: No JVD, no carotid bruits, no thyromegaly, no lymphadenopathy. Lungs:Clear bilaterally, no wheezes, rhonci, crackles Cardiovascular: Regular rate and rhythm. Systolic murmur noted. No gallops or rubs. Abdomen:Soft. Bowel sounds present. Non-tender.  Extremities: 1-2 + bilateral lower extremity edema. Chronic venous stasis changes.   Assessment and Plan:   1. CAD, NATIVE VESSEL: He is known to have severe CAD as described in HPI. He has restarted his ASA. Will continue conservative management.   2. CARDIOMYOPATHY, ISCHEMIC:  Volume status is ok. He still has some LE edema but weight stable. Lasix increased to 40 mg per day recently in  primary care. Renal function stable in May 2013 per check in primary  care. Continue medical therapy with beta blocker and Ace-inh. He refuses consideration of an ICD.   3. Atrial fibrillation: Maintaining NSR. Will continue amiodarone. He has been off of coumadin secondary to fall risk and non-compliance. No changes.

## 2012-04-25 NOTE — Patient Instructions (Addendum)
Your physician wants you to follow-up in:  12 months.  You will receive a reminder letter in the mail two months in advance. If you don't receive a letter, please call our office to schedule the follow-up appointment.   

## 2012-05-04 ENCOUNTER — Other Ambulatory Visit: Payer: Self-pay | Admitting: Cardiovascular Disease

## 2012-05-04 NOTE — Telephone Encounter (Signed)
Refilled potassium

## 2012-05-30 ENCOUNTER — Other Ambulatory Visit: Payer: Self-pay | Admitting: Cardiovascular Disease

## 2012-08-14 ENCOUNTER — Other Ambulatory Visit (HOSPITAL_BASED_OUTPATIENT_CLINIC_OR_DEPARTMENT_OTHER): Payer: Self-pay | Admitting: Internal Medicine

## 2012-08-14 DIAGNOSIS — R2681 Unsteadiness on feet: Secondary | ICD-10-CM

## 2012-08-15 ENCOUNTER — Ambulatory Visit
Admission: RE | Admit: 2012-08-15 | Discharge: 2012-08-15 | Disposition: A | Discharge status: Home / Self Care | Payer: Medicare Other | Source: Ambulatory Visit | Attending: Internal Medicine | Admitting: Internal Medicine

## 2012-08-15 DIAGNOSIS — R2681 Unsteadiness on feet: Secondary | ICD-10-CM

## 2012-08-31 ENCOUNTER — Other Ambulatory Visit: Payer: Self-pay | Admitting: Cardiovascular Disease

## 2012-11-07 ENCOUNTER — Other Ambulatory Visit: Payer: Self-pay | Admitting: Cardiovascular Disease

## 2012-11-14 ENCOUNTER — Other Ambulatory Visit: Payer: Self-pay | Admitting: Cardiovascular Disease

## 2012-12-06 ENCOUNTER — Observation Stay (HOSPITAL_COMMUNITY): Payer: Medicare Other

## 2012-12-06 ENCOUNTER — Encounter (HOSPITAL_COMMUNITY): Payer: Self-pay | Admitting: Emergency Medicine

## 2012-12-06 ENCOUNTER — Inpatient Hospital Stay (HOSPITAL_COMMUNITY)
Admission: EM | Admit: 2012-12-06 | Discharge: 2012-12-11 | DRG: 292 | Disposition: A | Discharge status: Skilled Nursing Facility | Payer: Medicare Other | Attending: Internal Medicine | Admitting: Internal Medicine

## 2012-12-06 ENCOUNTER — Emergency Department (HOSPITAL_COMMUNITY): Payer: Medicare Other

## 2012-12-06 DIAGNOSIS — E119 Type 2 diabetes mellitus without complications: Secondary | ICD-10-CM | POA: Diagnosis present

## 2012-12-06 DIAGNOSIS — I2789 Other specified pulmonary heart diseases: Secondary | ICD-10-CM | POA: Diagnosis present

## 2012-12-06 DIAGNOSIS — Y92009 Unspecified place in unspecified non-institutional (private) residence as the place of occurrence of the external cause: Secondary | ICD-10-CM

## 2012-12-06 DIAGNOSIS — M7989 Other specified soft tissue disorders: Secondary | ICD-10-CM

## 2012-12-06 DIAGNOSIS — Z87891 Personal history of nicotine dependence: Secondary | ICD-10-CM

## 2012-12-06 DIAGNOSIS — I10 Essential (primary) hypertension: Secondary | ICD-10-CM

## 2012-12-06 DIAGNOSIS — I238 Other current complications following acute myocardial infarction: Secondary | ICD-10-CM

## 2012-12-06 DIAGNOSIS — Z7982 Long term (current) use of aspirin: Secondary | ICD-10-CM

## 2012-12-06 DIAGNOSIS — Z9861 Coronary angioplasty status: Secondary | ICD-10-CM

## 2012-12-06 DIAGNOSIS — I4891 Unspecified atrial fibrillation: Secondary | ICD-10-CM | POA: Diagnosis present

## 2012-12-06 DIAGNOSIS — Z7901 Long term (current) use of anticoagulants: Secondary | ICD-10-CM

## 2012-12-06 DIAGNOSIS — R410 Disorientation, unspecified: Secondary | ICD-10-CM | POA: Diagnosis present

## 2012-12-06 DIAGNOSIS — N183 Chronic kidney disease, stage 3 unspecified: Secondary | ICD-10-CM | POA: Diagnosis present

## 2012-12-06 DIAGNOSIS — R55 Syncope and collapse: Secondary | ICD-10-CM | POA: Diagnosis present

## 2012-12-06 DIAGNOSIS — R296 Repeated falls: Secondary | ICD-10-CM | POA: Diagnosis present

## 2012-12-06 DIAGNOSIS — E876 Hypokalemia: Secondary | ICD-10-CM

## 2012-12-06 DIAGNOSIS — I5023 Acute on chronic systolic (congestive) heart failure: Principal | ICD-10-CM | POA: Diagnosis present

## 2012-12-06 DIAGNOSIS — F05 Delirium due to known physiological condition: Secondary | ICD-10-CM | POA: Diagnosis present

## 2012-12-06 DIAGNOSIS — I509 Heart failure, unspecified: Secondary | ICD-10-CM

## 2012-12-06 DIAGNOSIS — L03116 Cellulitis of left lower limb: Secondary | ICD-10-CM | POA: Diagnosis present

## 2012-12-06 DIAGNOSIS — I129 Hypertensive chronic kidney disease with stage 1 through stage 4 chronic kidney disease, or unspecified chronic kidney disease: Secondary | ICD-10-CM | POA: Diagnosis present

## 2012-12-06 DIAGNOSIS — I5021 Acute systolic (congestive) heart failure: Secondary | ICD-10-CM

## 2012-12-06 DIAGNOSIS — I82409 Acute embolism and thrombosis of unspecified deep veins of unspecified lower extremity: Secondary | ICD-10-CM

## 2012-12-06 DIAGNOSIS — Z9119 Patient's noncompliance with other medical treatment and regimen: Secondary | ICD-10-CM

## 2012-12-06 DIAGNOSIS — Z79899 Other long term (current) drug therapy: Secondary | ICD-10-CM

## 2012-12-06 DIAGNOSIS — L02419 Cutaneous abscess of limb, unspecified: Secondary | ICD-10-CM | POA: Diagnosis present

## 2012-12-06 DIAGNOSIS — Z91199 Patient's noncompliance with other medical treatment and regimen due to unspecified reason: Secondary | ICD-10-CM

## 2012-12-06 DIAGNOSIS — R5381 Other malaise: Secondary | ICD-10-CM

## 2012-12-06 DIAGNOSIS — Z86718 Personal history of other venous thrombosis and embolism: Secondary | ICD-10-CM

## 2012-12-06 DIAGNOSIS — S2220XA Unspecified fracture of sternum, initial encounter for closed fracture: Secondary | ICD-10-CM | POA: Diagnosis present

## 2012-12-06 DIAGNOSIS — L03115 Cellulitis of right lower limb: Secondary | ICD-10-CM | POA: Diagnosis present

## 2012-12-06 DIAGNOSIS — I2589 Other forms of chronic ischemic heart disease: Secondary | ICD-10-CM | POA: Diagnosis present

## 2012-12-06 DIAGNOSIS — R911 Solitary pulmonary nodule: Secondary | ICD-10-CM | POA: Diagnosis present

## 2012-12-06 DIAGNOSIS — D509 Iron deficiency anemia, unspecified: Secondary | ICD-10-CM | POA: Diagnosis present

## 2012-12-06 DIAGNOSIS — Z794 Long term (current) use of insulin: Secondary | ICD-10-CM

## 2012-12-06 DIAGNOSIS — E1129 Type 2 diabetes mellitus with other diabetic kidney complication: Secondary | ICD-10-CM | POA: Diagnosis present

## 2012-12-06 DIAGNOSIS — I2510 Atherosclerotic heart disease of native coronary artery without angina pectoris: Secondary | ICD-10-CM | POA: Diagnosis present

## 2012-12-06 DIAGNOSIS — I428 Other cardiomyopathies: Secondary | ICD-10-CM

## 2012-12-06 DIAGNOSIS — Z9181 History of falling: Secondary | ICD-10-CM

## 2012-12-06 DIAGNOSIS — I5022 Chronic systolic (congestive) heart failure: Secondary | ICD-10-CM

## 2012-12-06 DIAGNOSIS — I480 Paroxysmal atrial fibrillation: Secondary | ICD-10-CM

## 2012-12-06 HISTORY — DX: Heart failure, unspecified: I50.9

## 2012-12-06 LAB — COMPREHENSIVE METABOLIC PANEL
ALT: 22 U/L (ref 0–53)
Alkaline Phosphatase: 80 U/L (ref 39–117)
CO2: 27 mEq/L (ref 19–32)
Calcium: 9.2 mg/dL (ref 8.4–10.5)
Chloride: 98 mEq/L (ref 96–112)
GFR calc Af Amer: 33 mL/min — ABNORMAL LOW (ref >90)
GFR calc non Af Amer: 29 mL/min — ABNORMAL LOW (ref >90)
Glucose, Bld: 131 mg/dL — ABNORMAL HIGH (ref 70–99)
Potassium: 4.7 mEq/L (ref 3.5–5.1)
Sodium: 136 mEq/L (ref 135–145)
Total Bilirubin: 1.5 mg/dL — ABNORMAL HIGH (ref 0.3–1.2)

## 2012-12-06 LAB — CBC WITH DIFFERENTIAL/PLATELET
Eosinophils Relative: 2 % (ref 0–5)
HCT: 33.1 % — ABNORMAL LOW (ref 39.0–52.0)
Lymphocytes Relative: 11 % — ABNORMAL LOW (ref 12–46)
Lymphs Abs: 1.1 10*3/uL (ref 0.7–4.0)
MCV: 91.4 fL (ref 78.0–100.0)
Neutro Abs: 7.5 10*3/uL (ref 1.7–7.7)
Platelets: 166 10*3/uL (ref 150–400)
RBC: 3.62 MIL/uL — ABNORMAL LOW (ref 4.22–5.81)
WBC: 10.1 10*3/uL (ref 4.0–10.5)

## 2012-12-06 LAB — HEPATIC FUNCTION PANEL
ALT: 21 U/L (ref 0–53)
AST: 33 U/L (ref 0–37)
Alkaline Phosphatase: 72 U/L (ref 39–117)
Bilirubin, Direct: 0.4 mg/dL — ABNORMAL HIGH (ref 0.0–0.3)
Indirect Bilirubin: 1.2 mg/dL — ABNORMAL HIGH (ref 0.3–0.9)

## 2012-12-06 LAB — URINALYSIS, ROUTINE W REFLEX MICROSCOPIC
Bilirubin Urine: NEGATIVE
Ketones, ur: NEGATIVE mg/dL
Nitrite: NEGATIVE
Protein, ur: 100 mg/dL — AB
Urobilinogen, UA: 1 mg/dL (ref 0.0–1.0)

## 2012-12-06 LAB — CREATININE, SERUM: Creatinine, Ser: 1.89 mg/dL — ABNORMAL HIGH (ref 0.50–1.35)

## 2012-12-06 LAB — URINE MICROSCOPIC-ADD ON

## 2012-12-06 LAB — CBC
Hemoglobin: 11.2 g/dL — ABNORMAL LOW (ref 13.0–17.0)
MCH: 31.6 pg (ref 26.0–34.0)
MCHC: 34.5 g/dL (ref 30.0–36.0)
Platelets: 155 10*3/uL (ref 150–400)
RDW: 14.3 % (ref 11.5–15.5)

## 2012-12-06 LAB — POCT I-STAT TROPONIN I
Troponin i, poc: 0.04 ng/mL (ref 0.00–0.08)
Troponin i, poc: 0.03 ng/mL (ref 0.00–0.08)

## 2012-12-06 LAB — TSH: TSH: 4.213 u[IU]/mL (ref 0.350–4.500)

## 2012-12-06 LAB — HEMOGLOBIN A1C: Mean Plasma Glucose: 126 mg/dL — ABNORMAL HIGH (ref <117)

## 2012-12-06 MED ORDER — ENOXAPARIN SODIUM 30 MG/0.3ML ~~LOC~~ SOLN
30.0000 mg | SUBCUTANEOUS | Status: DC
  Administered 2012-12-06: 30 mg via SUBCUTANEOUS
  Filled 2012-12-06 (×2): qty 0.3

## 2012-12-06 MED ORDER — SODIUM CHLORIDE 0.9 % IJ SOLN
3.0000 mL | Freq: Two times a day (BID) | INTRAMUSCULAR | Status: DC
  Administered 2012-12-07 – 2012-12-11 (×2): 3 mL via INTRAVENOUS

## 2012-12-06 MED ORDER — FUROSEMIDE 10 MG/ML IJ SOLN
40.0000 mg | Freq: Two times a day (BID) | INTRAMUSCULAR | Status: DC
  Administered 2012-12-07 (×2): 40 mg via INTRAVENOUS
  Filled 2012-12-06 (×2): qty 4

## 2012-12-06 MED ORDER — AMIODARONE HCL 200 MG PO TABS
200.0000 mg | ORAL_TABLET | Freq: Every day | ORAL | Status: DC
  Administered 2012-12-06 – 2012-12-08 (×3): 200 mg via ORAL
  Filled 2012-12-06 (×3): qty 1

## 2012-12-06 MED ORDER — INSULIN ASPART 100 UNIT/ML ~~LOC~~ SOLN
3.0000 [IU] | Freq: Three times a day (TID) | SUBCUTANEOUS | Status: DC
  Administered 2012-12-06 – 2012-12-11 (×15): 3 [IU] via SUBCUTANEOUS

## 2012-12-06 MED ORDER — ONDANSETRON HCL 4 MG/2ML IJ SOLN: 4.0000 mg | Freq: Four times a day (QID) | INTRAMUSCULAR | Status: DC | PRN

## 2012-12-06 MED ORDER — ACETAMINOPHEN 325 MG PO TABS: 650.0000 mg | ORAL_TABLET | ORAL | Status: DC | PRN

## 2012-12-06 MED ORDER — SODIUM CHLORIDE 0.9 % IV SOLN
Freq: Once | INTRAVENOUS | Status: AC
  Administered 2012-12-06: 14:00:00 via INTRAVENOUS

## 2012-12-06 MED ORDER — SODIUM CHLORIDE 0.9 % IJ SOLN: 3.0000 mL | INTRAMUSCULAR | Status: DC | PRN

## 2012-12-06 MED ORDER — SODIUM CHLORIDE 0.9 % IJ SOLN
3.0000 mL | Freq: Two times a day (BID) | INTRAMUSCULAR | Status: DC
  Administered 2012-12-06 – 2012-12-07 (×2): 3 mL via INTRAVENOUS

## 2012-12-06 MED ORDER — FUROSEMIDE 10 MG/ML IJ SOLN
80.0000 mg | Freq: Once | INTRAMUSCULAR | Status: AC
  Administered 2012-12-06: 80 mg via INTRAVENOUS
  Filled 2012-12-06: qty 8

## 2012-12-06 MED ORDER — SENNOSIDES-DOCUSATE SODIUM 8.6-50 MG PO TABS
1.0000 | ORAL_TABLET | Freq: Every evening | ORAL | Status: DC | PRN
  Filled 2012-12-06 (×2): qty 1

## 2012-12-06 MED ORDER — NITROGLYCERIN 0.4 MG SL SUBL: 0.4000 mg | SUBLINGUAL_TABLET | SUBLINGUAL | Status: DC | PRN

## 2012-12-06 MED ORDER — ENOXAPARIN SODIUM 30 MG/0.3ML ~~LOC~~ SOLN
30.0000 mg | SUBCUTANEOUS | Status: DC
  Filled 2012-12-06: qty 0.3

## 2012-12-06 MED ORDER — DOXYCYCLINE HYCLATE 100 MG IV SOLR
100.0000 mg | Freq: Two times a day (BID) | INTRAVENOUS | Status: DC
  Administered 2012-12-06 – 2012-12-11 (×10): 100 mg via INTRAVENOUS
  Filled 2012-12-06 (×11): qty 100

## 2012-12-06 MED ORDER — ASPIRIN EC 81 MG PO TBEC
81.0000 mg | DELAYED_RELEASE_TABLET | Freq: Every day | ORAL | Status: DC
  Administered 2012-12-07 – 2012-12-11 (×5): 81 mg via ORAL
  Filled 2012-12-06 (×5): qty 1

## 2012-12-06 MED ORDER — ACETAMINOPHEN 650 MG RE SUPP: 650.0000 mg | Freq: Four times a day (QID) | RECTAL | Status: DC | PRN

## 2012-12-06 MED ORDER — SODIUM CHLORIDE 0.9 % IV SOLN: 250.0000 mL | INTRAVENOUS | Status: DC | PRN

## 2012-12-06 MED ORDER — ACETAMINOPHEN 325 MG PO TABS
650.0000 mg | ORAL_TABLET | Freq: Four times a day (QID) | ORAL | Status: DC | PRN
  Administered 2012-12-07 – 2012-12-08 (×2): 650 mg via ORAL
  Filled 2012-12-06 (×2): qty 2

## 2012-12-06 MED ORDER — POTASSIUM CHLORIDE CRYS ER 20 MEQ PO TBCR
20.0000 meq | EXTENDED_RELEASE_TABLET | Freq: Every day | ORAL | Status: DC
  Administered 2012-12-06 – 2012-12-11 (×6): 20 meq via ORAL
  Filled 2012-12-06 (×7): qty 1

## 2012-12-06 MED ORDER — SODIUM CHLORIDE 0.9 % IJ SOLN
3.0000 mL | Freq: Two times a day (BID) | INTRAMUSCULAR | Status: DC
  Administered 2012-12-07 – 2012-12-10 (×5): 3 mL via INTRAVENOUS

## 2012-12-06 MED ORDER — SODIUM CHLORIDE 0.9 % IV SOLN
250.0000 mL | INTRAVENOUS | Status: DC | PRN
  Administered 2012-12-06: 250 mL via INTRAVENOUS

## 2012-12-06 MED ORDER — CARVEDILOL 6.25 MG PO TABS
6.2500 mg | ORAL_TABLET | Freq: Every day | ORAL | Status: DC
  Administered 2012-12-06 – 2012-12-11 (×6): 6.25 mg via ORAL
  Filled 2012-12-06 (×6): qty 1

## 2012-12-06 MED ORDER — ALUM & MAG HYDROXIDE-SIMETH 200-200-20 MG/5ML PO SUSP: 30.0000 mL | Freq: Four times a day (QID) | ORAL | Status: DC | PRN

## 2012-12-06 MED ORDER — ONDANSETRON HCL 4 MG PO TABS: 4.0000 mg | ORAL_TABLET | Freq: Four times a day (QID) | ORAL | Status: DC | PRN

## 2012-12-06 MED ORDER — INSULIN ASPART 100 UNIT/ML ~~LOC~~ SOLN
0.0000 [IU] | Freq: Three times a day (TID) | SUBCUTANEOUS | Status: DC
Start: 2012-12-06 — End: 2012-12-11
  Administered 2012-12-08 – 2012-12-09 (×2): 2 [IU] via SUBCUTANEOUS
  Administered 2012-12-09: 1 [IU] via SUBCUTANEOUS
  Administered 2012-12-10 (×2): 2 [IU] via SUBCUTANEOUS
  Administered 2012-12-11: 1 [IU] via SUBCUTANEOUS
  Administered 2012-12-11: 2 [IU] via SUBCUTANEOUS

## 2012-12-06 MED ORDER — LEVOTHYROXINE SODIUM 25 MCG PO TABS
25.0000 ug | ORAL_TABLET | Freq: Every day | ORAL | Status: DC
  Administered 2012-12-07 – 2012-12-11 (×5): 25 ug via ORAL
  Filled 2012-12-06 (×6): qty 1

## 2012-12-06 NOTE — ED Provider Notes (Signed)
History     CSN: [id number]  Arrival date & time 12/06/12  1024   First MD Initiated Contact with Patient 12/06/12 1043      Chief Complaint  Patient presents with  . Fall    (Consider location/radiation/quality/duration/timing/severity/associated sxs/prior treatment) Patient is a 79 y.o. male presenting with fall. The history is provided by the patient and the EMS personnel.  Fall Incident onset: Patient was found by family members lying on the floor unable to get up. He is confused and is unable to say how long he has been lying on the floor. Fall occurred: At his home. He fell from a height of 1 to 2 ft. He landed on carpet. Point of impact: He complains of some chest pain. Pain location: He complains of some chest pain, though is unable to localize it. The pain is at a severity of 3/10. He was not ambulatory at the scene. There was no entrapment after the fall. There was no drug use involved in the accident. There was no alcohol use involved in the accident. Associated symptoms comments: He had stool on his body when he came to the ED, and also had urine stained clothing.. Prehospitalization: Patient was transported to [facility] by EMS.    Past Medical History  Diagnosis Date  . SYSTOLIC HEART FAILURE, ACUTE   . PULMONARY HYPERTENSION   . PAROXYSMAL ATRIAL FIBRILLATION   . HYPERTENSION   . DVT   . CARDIOMYOPATHY   . CAD, NATIVE VESSEL   . TOBACCO USE, QUIT   . RENAL DISEASE, CHRONIC, STAGE III   . MURAL THROMBUS, APEX OF HEART   . Long term current use of anticoagulant   . HYPOKALEMIA   . DM   . ANEMIA, IRON DEFICIENCY     Past Surgical History  Procedure Laterality Date  . Cholecystectomy    . Placement of a veriflex bare-metal sten      No family history on file.  History  Substance Use Topics  . Smoking status: Former Games developer  . Smokeless tobacco: Not on file  . Alcohol Use: No      Review of Systems  Unable to perform ROS: Mental status change     Allergies  Lisinopril  Home Medications   Current Outpatient Rx  Name  Route  Sig  Dispense  Refill  . amiodarone (PACERONE) 200 MG tablet      TAKE 1 TABLET BY MOUTH EVERY DAY   30 tablet   3   . amiodarone (PACERONE) 200 MG tablet      TAKE 1 TABLET BY MOUTH EVERY DAY   30 tablet   3   . carvedilol (COREG) 6.25 MG tablet      TAKE 1 TABLET BY MOUTH TWICE A DAY   60 tablet   6   . EXPIRED: ferrous sulfate 325 (65 FE) MG tablet   Oral   Take 1 tablet (325 mg total) by mouth daily with breakfast.   30 tablet   11   . EXPIRED: furosemide (LASIX) 20 MG tablet   Oral   Take 40 mg by mouth daily.         Marland Kitchen KLOR-CON M20 20 MEQ tablet      TAKE 1 TABLET EVERY DAY   30 tablet   5   . KLOR-CON M20 20 MEQ tablet      TAKE 1 TABLET EVERY DAY   30 tablet   5   . levothyroxine (SYNTHROID, LEVOTHROID) 50  MCG tablet   Oral   Take 50 mcg by mouth daily.         Marland Kitchen lisinopril (PRINIVIL,ZESTRIL) 5 MG tablet      TAKE 1 TABLET BY MOUTH EVERY DAY   30 tablet   5   . metFORMIN (GLUCOPHAGE-XR) 750 MG 24 hr tablet      Take one tablet by mouth twice daily   60 tablet   6   . nitroGLYCERIN (NITROSTAT) 0.4 MG SL tablet   Sublingual   Place 0.4 mg under the tongue every 5 (five) minutes as needed.           BP 148/66  Pulse 79  Temp(Src) 97.7 F (36.5 C) (Oral)  Resp 20  SpO2 95%  Physical Exam  Nursing note and vitals reviewed. Constitutional: He is oriented to person, place, and time.  Elderly man, confused, in no apparent distress.  HENT:  Head: Normocephalic and atraumatic.  Right Ear: External ear normal.  Left Ear: External ear normal.  Mouth/Throat: Oropharynx is clear and moist.  Eyes: Conjunctivae and EOM are normal. Pupils are equal, round, and reactive to light.  Neck: Normal range of motion. Neck supple.  Cardiovascular: Normal rate, regular rhythm and normal heart sounds.   Pulmonary/Chest: Effort normal and breath sounds normal.   Abdominal: Soft. Bowel sounds are normal.  Musculoskeletal: Normal range of motion.  He has chronic appearing cellulitis of the right lower leg.  Neurological: He is alert and oriented to person, place, and time.  No sensory or motor deficit.  Skin: Skin is warm and dry.  Psychiatric:  Seems confused, oriented to person but not to date or location.    ED Course  Procedures (including critical care time)  Labs Reviewed  CBC WITH DIFFERENTIAL  COMPREHENSIVE METABOLIC PANEL  URINALYSIS, ROUTINE W REFLEX MICROSCOPIC  PRO B NATRIURETIC PEPTIDE  CK TOTAL AND CKMB   10:58 AM  Date: 12/06/2012  Rate: 57  Rhythm: normal sinus rhythm  QRS Axis: normal  Intervals: PR prolonged QRS:  Poor R wave progression in precordial leads suggests old myocardial infarction.  ST/T Wave abnormalities: normal  Conduction Disutrbances:none  Narrative Interpretation: Abnormal EKG  Old EKG Reviewed: unchanged  12:56 PM Results for orders placed during the hospital encounter of 12/06/12  CBC WITH DIFFERENTIAL      Result Value Range   WBC 10.1  4.0 - 10.5 K/uL   RBC 3.62 (*) 4.22 - 5.81 MIL/uL   Hemoglobin 11.0 (*) 13.0 - 17.0 g/dL   HCT 96.0 (*) 45.4 - 09.8 %   MCV 91.4  78.0 - 100.0 fL   MCH 30.4  26.0 - 34.0 pg   MCHC 33.2  30.0 - 36.0 g/dL   RDW 11.9  14.7 - 82.9 %   Platelets 166  150 - 400 K/uL   Neutrophils Relative 74  43 - 77 %   Neutro Abs 7.5  1.7 - 7.7 K/uL   Lymphocytes Relative 11 (*) 12 - 46 %   Lymphs Abs 1.1  0.7 - 4.0 K/uL   Monocytes Relative 13 (*) 3 - 12 %   Monocytes Absolute 1.4 (*) 0.1 - 1.0 K/uL   Eosinophils Relative 2  0 - 5 %   Eosinophils Absolute 0.2  0.0 - 0.7 K/uL   Basophils Relative 0  0 - 1 %   Basophils Absolute 0.0  0.0 - 0.1 K/uL  COMPREHENSIVE METABOLIC PANEL      Result Value Range   Sodium  136  135 - 145 mEq/L   Potassium 4.7  3.5 - 5.1 mEq/L   Chloride 98  96 - 112 mEq/L   CO2 27  19 - 32 mEq/L   Glucose, Bld 131 (*) 70 - 99 mg/dL   BUN 25  (*) 6 - 23 mg/dL   Creatinine, Ser 4.09 (*) 0.50 - 1.35 mg/dL   Calcium 9.2  8.4 - 81.1 mg/dL   Total Protein 6.5  6.0 - 8.3 g/dL   Albumin 3.2 (*) 3.5 - 5.2 g/dL   AST 35  0 - 37 U/L   ALT 22  0 - 53 U/L   Alkaline Phosphatase 80  39 - 117 U/L   Total Bilirubin 1.5 (*) 0.3 - 1.2 mg/dL   GFR calc non Af Amer 29 (*) >90 mL/min   GFR calc Af Amer 33 (*) >90 mL/min  PRO B NATRIURETIC PEPTIDE      Result Value Range   Pro B Natriuretic peptide (BNP) 5232.0 (*) 0 - 450 pg/mL  CK TOTAL AND CKMB      Result Value Range   Total CK 275 (*) 7 - 232 U/L   CK, MB 6.1 (*) 0.3 - 4.0 ng/mL   Relative Index 2.2  0.0 - 2.5  POCT I-STAT TROPONIN I      Result Value Range   Troponin i, poc 0.04  0.00 - 0.08 ng/mL   Comment 3           POCT I-STAT TROPONIN I      Result Value Range   Troponin i, poc 0.03  0.00 - 0.08 ng/mL   Comment 3            Dg Chest 2 View  12/06/2012  *RADIOLOGY REPORT*  Clinical Data: Mid chest pain  CHEST - 2 VIEW  Comparison: 05/13/2009  Findings: Low volume film with asymmetric elevation of the right hemidiaphragm.  The Cardiopericardial silhouette is at upper limits of normal for size. Interstitial markings are diffusely coarsened with chronic features.  No pleural effusion.  Biapical pleural thickening again noted with new area of nodularity in the left apex.  Imaged bony structures of the thorax are intact.  IMPRESSION: New nodularity in the subpleural left apex.  CT chest without contrast recommended to further evaluate.  Underlying chronic interstitial changes as before.   Original Report Authenticated By: Kennith Center, M.D.     1:04 PM Pt's results reviewed with his caregiver, who is his deceased girlfriend's niece.  She says that he has been taking his medications every morning.  She found him on the floor this AM. CT head ordered, and CT chest ordered as requested by Radiologist. Call to [facility], who requested that pt be admitted by [facility].  1:53 PM Case discussed with Dr. David Stall.  Admit to observation status to a telemetry bed, [facility].     1. Syncope       Carleene Cooper III, MD 12/06/12 2055

## 2012-12-06 NOTE — H&P (Signed)
-   Start empiric antibiotics doxycycline IV. - Agree with IV lasix, replete electrolytes as needed. - Monitor on telemetry, echo pending. CT head negative. - Unlikely stroke can move all 4 extremity. Cont amiodarone. - D/c metformin start SSI. - Check a TSH.

## 2012-12-06 NOTE — H&P (Signed)
[facility] History and Physical  Joel Walker AVW:[id number] DOB: 26-May-1930 DOA: 12/06/2012  Referring physician: Dr. Ignacia Palma, EDP PCP: Kimber Relic, MD  Specialists: Dr. [facility]  Chief Complaint: "I don't know"  Patient brought in after being found down and confused.  HPI: Joel Walker is a 79 y.o. male CAD, chronic systolic CHF, ischemic CM, LV apical thrombus, DVT, PAF who lives alone.  He is cared for by his significant other's niece, Elita Quick, who lives next door.  He has not been on coumadin or aspirin since May 2012 due to issues with compliance and INR testing.  He was found down this morning by Pam who comes in each morning to give him breakfast and his medications.  He was confused and remains so in the ED.  He was found lying in urine and stool.  He does not remember falling.  He remembers crawling to the kitchen.  Per his sister he also had a fall last week but did not hurt himself.  He refuses to use his walker or wheelchair at home.  Per his sister his legs are more swollen than usual and the right LE is bright pink.  The patient does not complain of SOB, CP, Dizziness, changes in bowel habits, or dysuria.  He reports he drinks 2-3 beers a day, and quit using tobacco many years ago.  Because of Pam, he is very compliant with his medications.   In the ED he is a very poor historian.  His BNP is elevated at 5400, and he appears fluid overloaded.  His CK is 275.  Creatinine is 2.0 (I believe this may be his baseline).  He has a new nodularity in the left lung apex.  His right lower extremity is bright pink.  Review of Systems: The patient is a poor historian.  All systems reviewed and found to be negative except as mentioned in the HPI    Past Medical History  Diagnosis Date  . SYSTOLIC HEART FAILURE, ACUTE   . PULMONARY HYPERTENSION   . PAROXYSMAL ATRIAL FIBRILLATION   . HYPERTENSION   . DVT   . CARDIOMYOPATHY   . CAD, NATIVE VESSEL   .  TOBACCO USE, QUIT   . RENAL DISEASE, CHRONIC, STAGE III   . MURAL THROMBUS, APEX OF HEART   . Long term current use of anticoagulant   . HYPOKALEMIA   . DM   . ANEMIA, IRON DEFICIENCY    Past Surgical History  Procedure Laterality Date  . Cholecystectomy    . Placement of a veriflex bare-metal sten     Social History:  reports that he has quit smoking. He does not have any smokeless tobacco history on file. He reports that he does not drink alcohol or use illicit drugs. Patient drinks 2-3 Beers per day.  Quit hard alcohol and cigarettes "years ago".   Lives alone in his own apartment.  "neice next door checks on him multiple times per day.  Allergies  Allergen Reactions  . Lisinopril     REACTION: Cough. Unsure if this is a true allergy. Pt takes this med everyday with out any known side effects per family.    No family history on file. patient unable to provide.  Prior to Admission medications   Medication Sig Start Date End Date Taking? Authorizing Provider  amiodarone (PACERONE) 200 MG tablet Take 200 mg by mouth daily.   Yes Historical Provider, MD  aspirin EC 81 MG tablet Take 81 mg  by mouth daily.   Yes Historical Provider, MD  carvedilol (COREG) 6.25 MG tablet Take 6.25 mg by mouth daily.   Yes Historical Provider, MD  furosemide (LASIX) 40 MG tablet Take 40 mg by mouth daily.   Yes Historical Provider, MD  levothyroxine (SYNTHROID, LEVOTHROID) 25 MCG tablet Take 25 mcg by mouth daily before breakfast.   Yes Historical Provider, MD  lisinopril (PRINIVIL,ZESTRIL) 5 MG tablet Take 5 mg by mouth daily.   Yes Historical Provider, MD  metFORMIN (GLUCOPHAGE-XR) 750 MG 24 hr tablet Take 750 mg by mouth daily.   Yes Historical Provider, MD  nitroGLYCERIN (NITROSTAT) 0.4 MG SL tablet Place 0.4 mg under the tongue every 5 (five) minutes as needed.   Yes Historical Provider, MD  potassium chloride SA (K-DUR,KLOR-CON) 20 MEQ tablet Take 20 mEq by mouth daily.   Yes Historical Provider,  MD   Physical Exam: Filed Vitals:   12/06/12 1045 12/06/12 1253 12/06/12 1430  BP: 148/66 128/64 137/58  Pulse: 79 71 70  Temp: 97.7 F (36.5 C)    TempSrc: Oral    Resp: 20 14   SpO2: 95% 98% 99%     General:  WD, Elderly male, lying in the ED, NAD, Smiling. - smells strongly of urine.  Eyes: Sclera clear, PEER, Conjunctiva pink, no icterus  ENT: Oropharynx pink, no exudates,  No bite marks on the tongue  Neck: supple without lymphadenopathy, no notable carotid bruit, no JVD  Cardiovascular: Difficult to hear.  Tele appears regular at 75 bpm.  Bilateral lower extremity edema 2+ pitting R>L  Respiratory: CTA, no w/c/r, no accessory muscle use.  Abdomen: obese, soft, + BS, no masses, appears fluid overloaded.  Skin: generally clear, no rash, bruises, or lesions except for his lower extremities and feet.  Psychiatric: pleasant, Awake, poor historian, grooming poor, does not seem to have insight into his current health condition.  Neurologic: CN 2-12 grossly intact, nonfocal  Extremities:  Lower extremities with edema in the feet thru mid tibia, nails with fungus, right lower extremity with bright pink stocking that extends over his ankle and the dorsum of his foot.  Labs on Admission:  Basic Metabolic Panel:  Recent Labs Lab 12/06/12 1032  NA 136  K 4.7  CL 98  CO2 27  GLUCOSE 131*  BUN 25*  CREATININE 2.04*  CALCIUM 9.2   Liver Function Tests:  Recent Labs Lab 12/06/12 1032  AST 35  ALT 22  ALKPHOS 80  BILITOT 1.5*  PROT 6.5  ALBUMIN 3.2*   CBC:  Recent Labs Lab 12/06/12 1032  WBC 10.1  NEUTROABS 7.5  HGB 11.0*  HCT 33.1*  MCV 91.4  PLT 166   Cardiac Enzymes:  Recent Labs Lab 12/06/12 1051  CKTOTAL 275*  CKMB 6.1*    BNP (last 3 results)  Recent Labs  12/06/12 1051  PROBNP 5232.0*    Radiological Exams on Admission: Dg Chest 2 View  12/06/2012  *RADIOLOGY REPORT*  Clinical Data: Mid chest pain  CHEST - 2 VIEW  Comparison:  05/13/2009  Findings: Low volume film with asymmetric elevation of the right hemidiaphragm.  The Cardiopericardial silhouette is at upper limits of normal for size. Interstitial markings are diffusely coarsened with chronic features.  No pleural effusion.  Biapical pleural thickening again noted with new area of nodularity in the left apex.  Imaged bony structures of the thorax are intact.  IMPRESSION: New nodularity in the subpleural left apex.  CT chest without contrast recommended to further  evaluate.  Underlying chronic interstitial changes as before.   Original Report Authenticated By: Kennith Center, M.D.     EKG: Independently reviewed. Poor ekg, ? Afib, will repeat.  Assessment/Plan Active Problems:   Acute on chronic congestive heart failure   Syncope   Fall   Cellulitis of right lower extremity   Lung nodule   Confusion with non-focal neuro exam   Diabetes mellitus   Paroxysmal atrial fibrillation   Acute on Chronic CHF IVF saline locked IV Lasix ordered.  80 mg now, 40 bid starting tomorrow Daily weights, low sodium diet. Continued coreg and  2D echo ordered.  Last echo in 2011 showed EF of 30 - 35% [facility] consulted  Syncope / Seizure Patient found down, doesn't remember falling Confused.  Urinary incontinence? Obtaining 2D echo, will monitor on tele CT Head pending Monitor on tele, check EEG as well.  Fall PT / OT evaluation  Cellulitis vs venous stasis of the right lower extremity Venous Doppler  WOC WBC at 10, afebrile, leg non tender Will not start antibiotics at this time. Receiving IV lasix. Request WOC consult.  Diabetes Mellitus On Metformin at home. Hold Metformin, check Hgb A1C, start SSI-S with meal coverage. Carb modified, low salt diet.  Paroxysmal Afib Monitor on Tele Continue Amiodarone. Cards Consulation.  Chronic Kidney Disease, Stage III Creatinine is 2.0 - likely baseline.  New Lung Nodule in Left Apex Follow up with  non-contrast CT    Code Status: Full, confirmed on admission Family Communication: Sister and brother-in-law at bedside Disposition Plan: Inpatient Time spent: 60 min.  [place]ley [place] [facility] Pager [phone]  If 7PM-7AM, please contact night-coverage [email or web address] Password [facility] 12/06/2012, 3:29 PM

## 2012-12-06 NOTE — ED Notes (Signed)
Myself and Tammy Sours, RN undressed pt, placed in gown, on continuous pulse oximetry and blood pressure cuff; pt arrived in urine stained and soaked clothes; cleaned and dried pt; placed diaper on pt; warm blankets given

## 2012-12-06 NOTE — ED Notes (Addendum)
Patient found by family member on the floor. Patient lives at home alone. Unknown time patient on floor EMS was called and brought to ED for evaluation. Lower extremity edema with skin tear right anterior leg.  Patient alert answering and following commands appropriate.

## 2012-12-06 NOTE — ED Notes (Signed)
Patient transported to X-ray 

## 2012-12-06 NOTE — Progress Notes (Signed)
*  Preliminary Results* Bilateral lower extremity venous duplex completed. Bilateral lower extremities are negative for deep vein thrombosis. No evidence of Baker's cyst bilaterally.  12/06/2012 5:18 PM Gertie Fey, RDMS, RDCS

## 2012-12-07 ENCOUNTER — Inpatient Hospital Stay (HOSPITAL_COMMUNITY): Payer: Medicare Other

## 2012-12-07 DIAGNOSIS — E119 Type 2 diabetes mellitus without complications: Secondary | ICD-10-CM

## 2012-12-07 DIAGNOSIS — I517 Cardiomegaly: Secondary | ICD-10-CM

## 2012-12-07 DIAGNOSIS — R55 Syncope and collapse: Secondary | ICD-10-CM

## 2012-12-07 DIAGNOSIS — I4891 Unspecified atrial fibrillation: Secondary | ICD-10-CM

## 2012-12-07 DIAGNOSIS — I509 Heart failure, unspecified: Secondary | ICD-10-CM

## 2012-12-07 DIAGNOSIS — L02419 Cutaneous abscess of limb, unspecified: Secondary | ICD-10-CM

## 2012-12-07 LAB — GLUCOSE, CAPILLARY
Glucose-Capillary: 97 mg/dL (ref 70–99)
Glucose-Capillary: 120 mg/dL — ABNORMAL HIGH (ref 70–99)
Glucose-Capillary: 116 mg/dL — ABNORMAL HIGH (ref 70–99)

## 2012-12-07 LAB — COMPREHENSIVE METABOLIC PANEL
AST: 29 U/L (ref 0–37)
CO2: 25 mEq/L (ref 19–32)
Chloride: 100 mEq/L (ref 96–112)
Creatinine, Ser: 1.98 mg/dL — ABNORMAL HIGH (ref 0.50–1.35)
GFR calc non Af Amer: 30 mL/min — ABNORMAL LOW (ref >90)
Glucose, Bld: 105 mg/dL — ABNORMAL HIGH (ref 70–99)
Total Bilirubin: 1.5 mg/dL — ABNORMAL HIGH (ref 0.3–1.2)

## 2012-12-07 LAB — CBC
MCH: 31 pg (ref 26.0–34.0)
MCHC: 34 g/dL (ref 30.0–36.0)
Platelets: 144 10*3/uL — ABNORMAL LOW (ref 150–400)
RBC: 3.32 MIL/uL — ABNORMAL LOW (ref 4.22–5.81)

## 2012-12-07 LAB — TROPONIN I: Troponin I: <0.3 ng/mL (ref <0.30)

## 2012-12-07 MED ORDER — FUROSEMIDE 40 MG PO TABS
40.0000 mg | ORAL_TABLET | Freq: Two times a day (BID) | ORAL | Status: DC
  Administered 2012-12-08 – 2012-12-10 (×5): 40 mg via ORAL
  Filled 2012-12-07 (×8): qty 1

## 2012-12-07 MED ORDER — ENOXAPARIN SODIUM 40 MG/0.4ML ~~LOC~~ SOLN
40.0000 mg | SUBCUTANEOUS | Status: DC
  Administered 2012-12-07 – 2012-12-10 (×4): 40 mg via SUBCUTANEOUS
  Filled 2012-12-07 (×5): qty 0.4

## 2012-12-07 NOTE — Evaluation (Signed)
Physical Therapy Evaluation Patient Details Name: Joel Walker MRN: [id number] DOB: 1930/08/23 Today's Date: 12/07/2012 Time: 4098-1191 PT Time Calculation (min): 42 min  PT Assessment / Plan / Recommendation Clinical Impression  Patient is an 79 y/o admitted after fall and syncopal episode.  Has caregiver that lives next door and upon interview with pt's sister and brother in law has been since maybe January since he has been out to MD appt and able to walk any distance on his own.  Furniture walks at home and refuses assistive devices.  Feel unsafe to d/c home alone and will need STSNF level rehab with hopes to return home with intermittent assist.     PT Assessment  Patient needs continued PT services    Follow Up Recommendations  SNF       Barriers to Discharge Decreased caregiver support      Equipment Recommendations  None recommended by PT       Frequency Min 3X/week    Precautions / Restrictions Precautions Precautions: Fall Precaution Comments: sternal fx non-displaced but no precautions specifically stated   Pertinent Vitals/Pain C/o chest pain RN aware      Mobility  Bed Mobility Bed Mobility: Sit to Supine;Scooting to HOB Supine to Sit: 3: Mod assist;HOB flat Sit to Supine: 3: Mod assist;HOB flat Scooting to HOB: 3: Mod assist Details for Bed Mobility Assistance: assist for both legs into bed, scooting, difficulty pulling with UE's due to chest pain/sternal fx Transfers Sit to Stand: 3: Mod assist;With upper extremity assist;From chair/3-in-1 Stand to Sit: 4: Min assist;3: Mod assist;To bed Details for Transfer Assistance: cues for controlled descent on bed (second attempt-stood to get bottom clean; first time sat uncontrolled.)  From chair cues to use LE's more and UE's less due to sternal fx and pain Ambulation/Gait Ambulation/Gait Assistance: 4: Min assist Ambulation Distance (Feet): 25 Feet Assistive device: Rolling walker Ambulation/Gait  Assistance Details: in room only due to c/o chest soreness; afterwards pt also c/o dizziness Gait Pattern: Shuffle;Trunk flexed;Decreased hip/knee flexion - left;Decreased hip/knee flexion - right;Decreased stride length;Decreased trunk rotation        PT Diagnosis: Abnormality of gait;Generalized weakness  PT Problem List: Decreased strength;Decreased activity tolerance;Decreased balance;Decreased mobility;Decreased cognition;Decreased safety awareness;Pain PT Treatment Interventions: Patient/family education;Gait training;Stair training;Functional mobility training;Therapeutic activities;Therapeutic exercise;Balance training;Neuromuscular re-education   PT Goals Acute Rehab PT Goals PT Goal Formulation: With patient/family Time For Goal Achievement: 12/21/12 Potential to Achieve Goals: Good Pt will go Supine/Side to Sit: with supervision;with rail PT Goal: Supine/Side to Sit - Progress: Goal set today Pt will go Sit to Supine/Side: with supervision;with rail PT Goal: Sit to Supine/Side - Progress: Goal set today Pt will go Sit to Stand: with supervision;with upper extremity assist PT Goal: Sit to Stand - Progress: Goal set today Pt will go Stand to Sit: with supervision;with upper extremity assist PT Goal: Stand to Sit - Progress: Goal set today Pt will Stand: with supervision;3 - 5 min;with unilateral upper extremity support PT Goal: Stand - Progress: Goal set today Pt will Ambulate: 51 - 150 feet;with least restrictive assistive device;with supervision PT Goal: Ambulate - Progress: Goal set today Pt will Perform Home Exercise Program: with supervision, verbal cues required/provided PT Goal: Perform Home Exercise Program - Progress: Goal set today  Visit Information  Last PT Received On: 12/07/12 Assistance Needed: +1    Subjective Data  Subjective: I have some good help Patient Stated Goal: To return home (per pt and sister)   Prior Functioning  Home Living Lives With:  Alone Available Help at Discharge: Friend(s) (girlfriend's neice lives next door and assists daily) Type of Home: House Home Access: Stairs to enter [employer] of Steps: 5 Entrance Stairs-Rails: Can reach both Home Layout: One level Bathroom Shower/Tub: Forensic scientist: Standard Home Adaptive Equipment: Wheelchair - manual;Wheelchair - powered;Walker - rolling;Shower chair with back Additional Comments: Pt did not use the walker or wheelchairs at home he furnitiured walked most of the time. Prior Function Level of Independence: Needs assistance Needs Assistance: Meal Prep;Grooming Bath: Minimal Grooming: Minimal Meal Prep: Maximal Driving: No Vocation: Retired Musician: Surveyor, mining Overall Cognitive Status: Impaired Area of Impairment: Memory;Safety/judgement Arousal/Alertness: Awake/alert Orientation Level: Disoriented to;Time Behavior During Session: [facility] for tasks performed Memory Deficits: Unable to recall prior falls or reason for hospitalization. Following Commands: Follows one step commands inconsistently Safety/Judgement: Decreased awareness of need for assistance    Extremity/Trunk Assessment Right Upper Extremity Assessment RUE ROM/Strength/Tone: Within functional levels RUE Sensation: WFL - Light Touch RUE Coordination: WFL - gross/fine motor Left Upper Extremity Assessment LUE ROM/Strength/Tone: Within functional levels LUE Sensation: WFL - Light Touch LUE Coordination: WFL - gross/fine motor Right Lower Extremity Assessment RLE ROM/Strength/Tone: Deficits RLE ROM/Strength/Tone Deficits: AROM WFL, errythema, edema and bandage on back of lower leg; sister reports multiple tx's with antibiotics at home; Strength 4/5 throughout Left Lower Extremity Assessment LLE ROM/Strength/Tone: Deficits LLE ROM/Strength/Tone Deficits: AROM WFL; strength grossly 4- to 4/5 Trunk Assessment Trunk  Assessment: Kyphotic;Other exceptions Trunk Exceptions: cervical flexion   Balance Balance Balance Assessed: Yes Static Standing Balance Static Standing - Balance Support: Bilateral upper extremity supported Static Standing - Level of Assistance: 4: Min assist Static Standing - Comment/# of Minutes: stood for perineal hygiene with minguard assist holding walker about 1 minute  End of Session PT - End of Session Equipment Utilized During Treatment: Gait belt Activity Tolerance: Patient limited by fatigue Patient left: in bed;with call bell/phone within reach;with family/visitor present  GP     [facility] 12/07/2012, 4:48 PM Sheran Lawless, PT [phone] 12/07/2012

## 2012-12-07 NOTE — Progress Notes (Addendum)
Pharmacy Clarification: Adjusted lovenox to 40 mg sq q24h for dvt pxl in this patient with SCr>30 ml/min.  Pharmacist Heart Failure Core Measure Documentation  Assessment: Joel Walker has an EF documented as 30-35%  on 12/04/2009 by ECHO. (having repeat ECHO today)  Rationale: Heart failure patients with left ventricular systolic dysfunction (LVSD) and an EF < 40% should be prescribed an angiotensin converting enzyme inhibitor (ACEI) or angiotensin receptor blocker (ARB) at discharge unless a contraindication is documented in the medical record.  This patient is not currently on an ACEI or ARB for HF.  This note is being placed in the record in order to provide documentation that a contraindication to the use of these agents is present for this encounter.  ACE Inhibitor or Angiotensin Receptor Blocker is contraindicated (specify all that apply)  [x]   ACEI allergy AND ARB allergy []   Angioedema []   Moderate or severe aortic stenosis []   Hyperkalemia []   Hypotension []   Renal artery stenosis []   Worsening renal function, preexisting renal disease or dysfunction   Drusilla Kanner 12/07/2012 3:10 PM        Thanks,  Marchelle Folks. Illene Bolus, PharmD, BCPS Clinical Pharmacist Pager: [phone] Pharmacy: [phone] 12/07/2012 3:09 PM

## 2012-12-07 NOTE — Evaluation (Signed)
Occupational Therapy Evaluation Patient Details Name: Joel Walker MRN: [id number] DOB: 1930/05/24 Today's Date: 12/07/2012 Time: 4098-1191 OT Time Calculation (min): 55 min  OT Assessment / Plan / Recommendation Clinical Impression  Pt is an 79 yr old male admitted after fall and syncopal episode.  Presents with decreased overall memory, awareness of the event or even previous falls.  Needs max assist for functional transfers and selfcare tasks at this time.  Feel he will benefit from acute care OT to help increase overall independence and performance of selfcare tasks, however feel he will need continued rehab and 24 hour supervison at SNF level when ready for discharge.      OT Assessment  Patient needs continued OT Services    Follow Up Recommendations  SNF       Equipment Recommendations  3 in 1 bedside comode;Tub/shower bench       Frequency  Min 2X/week    Precautions / Restrictions Precautions Precautions: Fall Precaution Comments: sternal fx non-displaced but no precautions specifically stated   Pertinent Vitals/Pain Pt with chest pain during movement, no number rating given O2 sats 90-93% on room air, HR 74-85 BPM sitting EOB and post transfer to bedside chair    ADL  Eating/Feeding: Performed;Independent Where Assessed - Eating/Feeding: Chair Grooming: Simulated;Set up Where Assessed - Grooming: Unsupported sitting Upper Body Bathing: Simulated;Set up Where Assessed - Upper Body Bathing: Unsupported sitting Lower Body Bathing: Simulated;Maximal assistance Where Assessed - Lower Body Bathing: Supported sit to stand Upper Body Dressing: Simulated;Set up Where Assessed - Upper Body Dressing: Unsupported sitting Lower Body Dressing: Simulated;Maximal assistance Where Assessed - Lower Body Dressing: Supported sit to stand Toilet Transfer: Simulated;Maximal assistance Toilet Transfer Method: Surveyor, minerals: Other (comment) (bedside  chair) Toileting - Clothing Manipulation and Hygiene: Simulated;Maximal assistance Where Assessed - Engineer, mining and Hygiene: Other (comment) (sit to stand from EOB) Tub/Shower Transfer Method: Not assessed Equipment Used: Gait belt Transfers/Ambulation Related to ADLs: Pt needs max assist for stand pivot transfer from bed to bedside chair. Would not hold onto therapist hand but instead reached for arms of chair. ADL Comments: Pt with poor memory of previous falls at home when asked.  Pt's significant other's neice was present as well as pt's sister to give accurrate PLOF information.  Pt reporting increased chest pain once sitting EOB, HR stable however at 75-85 BPM.  Pt with decreased awareness for the need to have assistance with functional transfers. Keeps head flexed in standing and did report feel ing drunk when he stood up.  Feel pt will need SNF for follow-up rehab.    OT Diagnosis: Generalized weakness;Cognitive deficits;Acute pain  OT Problem List: Decreased strength;Decreased activity tolerance;Impaired balance (sitting and/or standing);Decreased knowledge of use of DME or AE;Decreased safety awareness;Decreased cognition;Pain OT Treatment Interventions: Self-care/ADL training;Therapeutic activities;Cognitive remediation/compensation;Patient/family education;DME and/or AE instruction;Balance training   OT Goals Acute Rehab OT Goals OT Goal Formulation: With patient Time For Goal Achievement: 12/21/12 Potential to Achieve Goals: Good ADL Goals Pt Will Perform Grooming: Standing at sink;Other (comment);with min assist (2 tasks) ADL Goal: Grooming - Progress: Goal set today Pt Will Perform Lower Body Bathing: with min assist;Sit to stand from bed;Supported ADL Goal: Lower Body Bathing - Progress: Goal set today Pt Will Perform Lower Body Dressing: with min assist;Sit to stand from bed ADL Goal: Lower Body Dressing - Progress: Goal set today Pt Will Transfer to  Toilet: with min assist;3-in-1;with DME;Stand pivot transfer ADL Goal: Toilet Transfer - Progress: Goal set  today Miscellaneous OT Goals Miscellaneous OT Goal #1: Pt will demonstrate emergent awareness by stating that he needs assistance with transferring to the toilet or OOB when questioned by therapist. OT Goal: Miscellaneous Goal #1 - Progress: Goal set today  Visit Information  Last OT Received On: 12/07/12 Assistance Needed: +1    Subjective Data  Subjective: I don't know if I have?  (when asked about falls at home) Patient Stated Goal: Pt did not state   Prior Functioning     Home Living Lives With: Alone Available Help at Discharge: Other (Comment) (significant other's niece visits daily and lives next door) Type of Home: House Home Access: Stairs to enter Secretary/administrator of Steps: 5 Entrance Stairs-Rails: Can reach both Home Layout: One level Bathroom Shower/Tub: Forensic scientist: Standard Home Adaptive Equipment: Wheelchair - manual;Wheelchair - powered;Walker - rolling;Shower chair with back Additional Comments: Pt did not use the walker or wheelchairs at home he furnitiured walked most of the time. Prior Function Level of Independence: Needs assistance Needs Assistance: Meal Prep;Grooming Bath: Minimal Grooming: Minimal Meal Prep: Maximal Driving: No Vocation: Retired Musician: No difficulties Dominant Hand: Right         Vision/Perception Vision - History Baseline Vision: Wears glasses all the time Patient Visual Report: No change from baseline Vision - Assessment Eye Alignment: Within Functional Limits Additional Comments: Attempted visual scanning and pt unable to track therapist's finger consistently in any direction. Perception Perception: Within Functional Limits Praxis Praxis: Intact   Cognition  Cognition Overall Cognitive Status: Impaired Area of Impairment:  Memory;Safety/judgement;Following commands;Problem solving Arousal/Alertness: Awake/alert Orientation Level: Disoriented to;Time;Situation Behavior During Session: [facility] for tasks performed Memory Deficits: Unable to recall prior falls or reason for hospitalization. Following Commands: Follows one step commands inconsistently Safety/Judgement: Decreased awareness of need for assistance    Extremity/Trunk Assessment Right Upper Extremity Assessment RUE ROM/Strength/Tone: Within functional levels RUE Sensation: WFL - Light Touch RUE Coordination: WFL - gross/fine motor Left Upper Extremity Assessment LUE ROM/Strength/Tone: Within functional levels LUE Sensation: WFL - Light Touch LUE Coordination: WFL - gross/fine motor Trunk Assessment Trunk Assessment: Kyphotic;Other exceptions (cervical flexion in standing)     Mobility Bed Mobility Bed Mobility: Supine to Sit Supine to Sit: 3: Mod assist;HOB flat Transfers Transfers: Sit to Stand;Stand to Sit Sit to Stand: 3: Mod assist;With upper extremity assist;From bed Stand to Sit: 3: Mod assist;With upper extremity assist;To bed;To chair/3-in-1        Balance Balance Balance Assessed: Yes Static Standing Balance Static Standing - Balance Support: No upper extremity supported Static Standing - Level of Assistance: 2: Max assist   End of Session OT - End of Session Equipment Utilized During Treatment: Gait belt Activity Tolerance: Patient limited by pain Patient left: in chair;with call bell/phone within reach;with family/visitor present Nurse Communication: Mobility status    Brandom Kerwin OTR/L Pager number [phone] 12/07/2012, 1:39 PM

## 2012-12-07 NOTE — Progress Notes (Signed)
[facility] PROGRESS NOTE  Joel Walker ZOX:[id number] DOB: 1929/12/13 DOA: 12/06/2012 PCP: Kimber Relic, MD  Assessment/Plan: Acute on Chronic CHF  Change too oral lasix beginning in am Daily weights, low sodium diet. Continued coreg  Await 2D echo ordered. Last echo in 2011 showed EF of 30 - 35%  Await  Cardiology eval  Cardiac enzymes neg  Syncope  Patient found down, doesn't remember falling  Confused. Urinary incontinence?  Await 2D echo,CT Head, EEG, continue monitoring on tele -cardiology consulted last pm, await eval  -cardiac enzymes neg  Fall -Pt also reports vertigo, will obtain MRI to eval for posterior circulation infarct  -TSH wnl  -PT / OT evaluation, will likely need SNF   Upper Sternal fracture -non depressed with no substernal or mediastinal hematoma -s/p fall, pain control -with remote old fractures noted - c/w with family's history of pt falling frequently  Cellulitis vs venous stasis of the right lower extremity  - LE dopplers  neg -improving on abx -WOC consult.  Diabetes Mellitus   -continue SSI with meal coverage.  -Carb modified, low salt diet. -continue holding metformin   Paroxysmal Afib  In NSR, Continue Amiodarone.  Await Cards Consult.   Chronic Kidney Disease, Stage III  No sig change in cr with diuresis so far -continue close monitoring on lasix  ?New Lung Nodule in Left Apex -per cxr non-contrast CT with no lung nodule reported interstitial lung disease noted but no acute findings      Code Status: full Family Communication: sister and niece at the bedside Disposition Plan: possible SNF when medically stable   Consultants:  Awaiting cardiology eval  Procedures:  Echo-results pending  Antibiotics:  Doxycycline started on 4/2  HPI/Subjective: Denies SOB and no CP. Admits to intermittent vertigo. Family at bedside  Objective: Filed Vitals:   12/06/12 1613 12/06/12 2041 12/07/12 0222 12/07/12  0507  BP:  110/50 116/54 136/62  Pulse:  62 62 60  Temp:  98.4 F (36.9 C)  97.8 F (36.6 C)  TempSrc:  Oral  Oral  Resp:  20 18 18   Height:      Weight:    90.4 kg (199 lb 4.7 oz)  SpO2: 65% 100%  100%    Intake/Output Summary (Last 24 hours) at 12/07/12 1029 Last data filed at 12/07/12 0837  Gross per 24 hour  Intake   1160 ml  Output   1700 ml  Net   -540 ml   Filed Weights   12/06/12 1545 12/07/12 0507  Weight: 97.7 kg (215 lb 6.2 oz) 90.4 kg (199 lb 4.7 oz)    Exam:   General:  Pt is alert and oriented to person   Cardiovascular: RRR, nl S1S2  Respiratory: decreased BS at bases,o/w clear  Abdomen: soft +BS NT/ND  Musculoskeletal: no edema   Data Reviewed: Basic Metabolic Panel:  Recent Labs Lab 12/06/12 1032 12/06/12 1655 12/07/12 0625  NA 136  --  135  K 4.7  --  3.6  CL 98  --  100  CO2 27  --  25  GLUCOSE 131*  --  105*  BUN 25*  --  24*  CREATININE 2.04* 1.89* 1.98*  CALCIUM 9.2  --  8.5   Liver Function Tests:  Recent Labs Lab 12/06/12 1032 12/06/12 1655 12/07/12 0625  AST 35 33 29  ALT 22 21 20   ALKPHOS 80 72 67  BILITOT 1.5* 1.6* 1.5*  PROT 6.5 6.0 5.6*  ALBUMIN 3.2* 2.9* 2.7*  No results found for this basename: LIPASE, AMYLASE,  in the last 168 hours  Recent Labs Lab 12/06/12 1701  AMMONIA 20   CBC:  Recent Labs Lab 12/06/12 1032 12/06/12 1655 12/07/12 0625  WBC 10.1 9.6 7.4  NEUTROABS 7.5  --   --   HGB 11.0* 11.2* 10.3*  HCT 33.1* 32.5* 30.3*  MCV 91.4 91.8 91.3  PLT 166 155 144*   Cardiac Enzymes:  Recent Labs Lab 12/06/12 1051 12/07/12 0915  CKTOTAL 275*  --   CKMB 6.1*  --   TROPONINI  --  <0.30   BNP (last 3 results)  Recent Labs  12/06/12 1051  PROBNP 5232.0*   CBG:  Recent Labs Lab 12/06/12 1723 12/06/12 2153 12/07/12 0610  GLUCAP 106* 156* 97    No results found for this or any previous visit (from the past 240 hour(s)).   Studies: Dg Chest 2 View  12/06/2012  *RADIOLOGY  REPORT*  Clinical Data: Mid chest pain  CHEST - 2 VIEW  Comparison: 05/13/2009  Findings: Low volume film with asymmetric elevation of the right hemidiaphragm.  The Cardiopericardial silhouette is at upper limits of normal for size. Interstitial markings are diffusely coarsened with chronic features.  No pleural effusion.  Biapical pleural thickening again noted with new area of nodularity in the left apex.  Imaged bony structures of the thorax are intact.  IMPRESSION: New nodularity in the subpleural left apex.  CT chest without contrast recommended to further evaluate.  Underlying chronic interstitial changes as before.   Original Report Authenticated By: Kennith Center, M.D.    Ct Head Wo Contrast  12/06/2012  *RADIOLOGY REPORT*  Clinical Data: Larey Seat.  Hit head.  CT HEAD WITHOUT CONTRAST  Technique:  Contiguous axial images were obtained from the base of the skull through the vertex without contrast.  Comparison: 08/15/2012.  Findings: Stable age related cerebral atrophy, ventriculomegaly and periventricular white matter disease.  No extra-axial fluid collections are identified.  No CT findings for acute hemispheric infarction and/or intracranial hemorrhage.  No mass lesions.  The brainstem and cerebellum grossly normal and stable.  Vascular calcifications are noted.  Globes are intact.  The bony structures are intact.  No acute skull fracture.  The paranasal sinuses and mastoid air cells are grossly clear.  IMPRESSION:  1.  Age related cerebral atrophy, ventriculomegaly and periventricular white matter disease. 2.  No acute intracranial findings or skull fracture.   Original Report Authenticated By: Rudie Meyer, M.D.    Ct Chest Wo Contrast  12/06/2012  *RADIOLOGY REPORT*  Clinical Data: Larey Seat.  Chest pain.  CT CHEST WITHOUT CONTRAST  Technique:  Multidetector CT imaging of the chest was performed following the standard protocol without IV contrast.  Comparison: None.  Findings: Examination of the chest wall  demonstrates a upper sternal fracture without displacement.  There is an associated pre sternal hematoma.  No retrosternal hematoma.  No definite acute rib fractures.  There are remote healed rib fractures on the left.  No supraclavicular or axillary mass or adenopathy.  The heart is normal in size.  No pericardial effusion.  Advanced atherosclerotic calcifications involving the coronary arteries. The aorta is normal in caliber.  Scattered atherosclerotic calcifications.  No mediastinal or hilar adenopathy.  Small scattered lymph nodes are noted.  The esophagus is grossly normal.  Examination of the lung parenchyma demonstrates changes of peripheral interstitial lung disease, likely UIP.  No pulmonary contusion, pneumothorax, atelectasis, pleural effusion or worrisome lung lesion.  The tracheobronchial tree  is unremarkable  The upper abdomen demonstrates calcified granulomas in the liver and spleen.  Gallbladder surgically absent.  Aortic calcifications without aneurysm.  IMPRESSION:  1.  Non depressed upper sternal fracture without substernal or mediastinal hematoma. 2.  Remote healed left rib fractures.  No definite acute rib fractures. 3.  Interstitial lung disease but no acute pulmonary findings. 4.  Advanced atherosclerotic calcifications involving the coronary arteries.   Original Report Authenticated By: Rudie Meyer, M.D.     Scheduled Meds: . amiodarone  200 mg Oral Daily  . aspirin EC  81 mg Oral Daily  . carvedilol  6.25 mg Oral Daily  . doxycycline (VIBRAMYCIN) IV  100 mg Intravenous Q12H  . enoxaparin (LOVENOX) injection  30 mg Subcutaneous Q24H  . furosemide  40 mg Intravenous BID  . insulin aspart  0-9 Units Subcutaneous TID WC  . insulin aspart  3 Units Subcutaneous TID WC  . levothyroxine  25 mcg Oral QAC breakfast  . potassium chloride SA  20 mEq Oral Daily  . sodium chloride  3 mL Intravenous Q12H  . sodium chloride  3 mL Intravenous Q12H  . sodium chloride  3 mL Intravenous Q12H    Continuous Infusions:   Active Problems:   Acute on chronic congestive heart failure   Syncope   Fall   Cellulitis of right lower extremity   Lung nodule   Confusion with non-focal neuro exam   Diabetes mellitus   Paroxysmal atrial fibrillation    Time spent: 35    Zvi Duplantis C  [facility] Pager [phone]. If 7PM-7AM, please contact night-coverage at [email or web address], password [facility] 12/07/2012, 10:29 AM  LOS: 1 day

## 2012-12-07 NOTE — Progress Notes (Signed)
Utilization Review Completed.   Shawntae Lowy, RN, BSN Nurse Case Manager  [phone]  

## 2012-12-07 NOTE — Progress Notes (Signed)
EEG completed.

## 2012-12-07 NOTE — Progress Notes (Signed)
  Echocardiogram 2D Echocardiogram has been performed.  Joel Walker 12/07/2012, 2:33 PM

## 2012-12-07 NOTE — Consult Note (Signed)
WOC consult Note Reason for Consult: Consult requested for cellulitis.  Left leg without edema or erythremia or open wounds. Wound type: Right leg with 2 areas of partial thickness stasis ulcers to anterior lower calf.  Posterior leg with small intact scab .2X.2cm, dry and yellow without drainage, surrounded by erythremia. Measurement: Right anterior leg 1.5X1.5X.1cm and 1X1X.1cm Wound bed: both sites moist pink wound beds Drainage (amount, consistency, odor) mod yellow drainage, no odor Periwound: Generalized edema and erythremia to right calf, tender to touch.  Dressing procedure/placement/frequency: Foam dressing to absorb drainage and promote healing Please re-consult if further assistance is needed.  Thank-you,  Cammie Mcgee MSN, RN, CWOCN, [place], CNS [phone]

## 2012-12-08 ENCOUNTER — Inpatient Hospital Stay (HOSPITAL_COMMUNITY): Payer: Medicare Other

## 2012-12-08 DIAGNOSIS — I5021 Acute systolic (congestive) heart failure: Secondary | ICD-10-CM

## 2012-12-08 LAB — BASIC METABOLIC PANEL
BUN: 26 mg/dL — ABNORMAL HIGH (ref 6–23)
CO2: 26 mEq/L (ref 19–32)
Chloride: 98 mEq/L (ref 96–112)
Creatinine, Ser: 2.03 mg/dL — ABNORMAL HIGH (ref 0.50–1.35)
Potassium: 3.9 mEq/L (ref 3.5–5.1)

## 2012-12-08 LAB — GLUCOSE, CAPILLARY
Glucose-Capillary: 157 mg/dL — ABNORMAL HIGH (ref 70–99)
Glucose-Capillary: 81 mg/dL (ref 70–99)

## 2012-12-08 MED ORDER — AMIODARONE HCL 100 MG PO TABS
100.0000 mg | ORAL_TABLET | Freq: Every day | ORAL | Status: DC
  Administered 2012-12-09 – 2012-12-11 (×3): 100 mg via ORAL
  Filled 2012-12-08 (×3): qty 1

## 2012-12-08 NOTE — Progress Notes (Signed)
Physical Therapy Treatment Patient Details Name: Joel Walker MRN: [id number] DOB: May 09, 1930 Today's Date: 12/08/2012 Time: 5284-1324 PT Time Calculation (min): 32 min  PT Assessment / Plan / Recommendation Comments on Treatment Session  Patient needs increased time for all mobility with c/o wooziness in sitting.  Did note 20 mmHg drop in BP sitting edge of bed to sitting in chair (after standing; from 154 to 133.)  Will benefit from SNF level rehab at d/c.    Follow Up Recommendations  SNF           Equipment Recommendations  None recommended by PT       Frequency Min 3X/week   Plan Discharge plan remains appropriate    Precautions / Restrictions Precautions Precautions: Fall Precaution Comments: sternal fx non-displaced but no precautions specifically stated   Pertinent Vitals/Pain Mod c/o chest discomfort with mobility    Mobility  Bed Mobility Bed Mobility: Sitting - Scoot to Edge of Bed Supine to Sit: 4: Min assist;With rails;HOB flat Sitting - Scoot to Edge of Bed: 5: Supervision Details for Bed Mobility Assistance: Limited due to c/o pain in breastbone; increased time for mobility due to c/o "drunk" Transfers Sit to Stand: 4: Min assist;From bed;With upper extremity assist Stand to Sit: 4: Min assist;To chair/3-in-1;With upper extremity assist Details for Transfer Assistance: max cues for safety with backing up to chair; encouraged more LE use for sit>stand due to c/o pain in chest pushing with UE's Ambulation/Gait Ambulation/Gait Assistance: 4: Min assist Ambulation Distance (Feet): 5 Feet Assistive device: Rolling walker Ambulation/Gait Assistance Details: bed to chair only due to c/o dizziness.  very short shuffle steps "Augustina Mood" like gait Gait Pattern: Shuffle;Trunk flexed;Decreased hip/knee flexion - right;Decreased hip/knee flexion - left;Decreased trunk rotation    Exercises General Exercises - Lower Extremity Ankle Circles/Pumps: AROM;Both;20  reps;Supine Heel Slides: AROM;Both;10 reps;Supine Hip ABduction/ADduction: AROM;Both;10 reps;Supine     PT Goals Acute Rehab PT Goals Pt will go Supine/Side to Sit: with supervision PT Goal: Supine/Side to Sit - Progress: Progressing toward goal Pt will go Sit to Stand: with supervision;with upper extremity assist PT Goal: Sit to Stand - Progress: Progressing toward goal Pt will go Stand to Sit: with supervision;with upper extremity assist PT Goal: Stand to Sit - Progress: Progressing toward goal Pt will Stand: with supervision;3 - 5 min;with unilateral upper extremity support PT Goal: Stand - Progress: Progressing toward goal Pt will Ambulate: 51 - 150 feet;with least restrictive assistive device;with supervision PT Goal: Ambulate - Progress: Progressing toward goal Pt will Perform Home Exercise Program: with supervision, verbal cues required/provided PT Goal: Perform Home Exercise Program - Progress: Progressing toward goal  Visit Information  Last PT Received On: 12/08/12    Subjective Data  Subjective: I feel drunk.  Just going to sit here a bit   Cognition  Cognition Overall Cognitive Status: Impaired Area of Impairment: Memory;Safety/judgement Arousal/Alertness: Awake/alert Behavior During Session: [facility] for tasks performed Safety/Judgement: Decreased awareness of need for assistance    Balance  Static Standing Balance Static Standing - Balance Support: Bilateral upper extremity supported Static Standing - Level of Assistance: 4: Min assist  End of Session PT - End of Session Equipment Utilized During Treatment: Gait belt Activity Tolerance: Other (comment) (limited due to c/o feeling "drunk") Patient left: in chair;with call bell/phone within reach;with family/visitor present   GP     [facility] 12/08/2012, 12:03 PM Sheran Lawless, PT [phone] 12/08/2012

## 2012-12-08 NOTE — Consult Note (Signed)
Reason for Consult:CHF Referring Physician: [facility] Primary cardiologist:  Dr. Clifton James PCP: Dr. Frederik Pear  Joel Walker is an 79 y.o. male.  HPI: This 79 year old gentleman was admitted on 12/06/12 after a mechanical fall at home.  He fell on the evening of 12/05/12 and layon the living room floor all night and was discovered the next morning by the housekeeper who called 911.  The patient has a past history of known cardiac disease and is followed by Dr. Clifton James who  last saw him on 04/25/12.  He has history of CAD, chronic systolic CHF, ischemic CM, LV apical thrombus, DVT, PAF.Marland Kitchen He was admitted to [facility] in September 2009 with a NSTEMI and was found to have severe LV dysfunction with EF of 20%, non-viable myocardium in anterior and inferior walls with high degree stenosis in a Ramus Intermediate branch and viability in the lateral wall now s/p bare metal stent to the Ramus Intermediate artery. He was also found to have an LV apical thrombus and chronic DVT in right lower extremity for which he was placed on coumadin for anticoagulation. He is also known to have paroxysmal atrial fibrillation. His coumadin was stopped in May 2012 because of non-compliance with his medications and non-compliance with f/u visits for INR.  After his last visit with Dr. Clifton James the patient has been on a daily aspirin.  He has not been aware of any cardiac palpitations or evidence of recurrent atrial fibrillation.  He has been on chronic amiodarone 200 mg daily and has been maintaining sinus rhythm.  He denies any recent chest pain or angina pectoris.  He has had problems with dizziness.  He denies any problem with shortness of breath. He states that he fell at down after getting something out of the refrigerator and turning too quickly and lost his balance.  Although he had a cell phone in his shirt pocket, he did not remember to use it and can't from the kitchen into his living room where he fell  asleep from exhaustion and lay the rest of the night. a Past Medical History  Diagnosis Date  . SYSTOLIC HEART FAILURE, ACUTE   . PULMONARY HYPERTENSION   . PAROXYSMAL ATRIAL FIBRILLATION   . HYPERTENSION   . DVT   . CARDIOMYOPATHY   . CAD, NATIVE VESSEL   . TOBACCO USE, QUIT   . RENAL DISEASE, CHRONIC, STAGE III   . MURAL THROMBUS, APEX OF HEART   . Long term current use of anticoagulant   . HYPOKALEMIA   . DM   . ANEMIA, IRON DEFICIENCY   . CHF (congestive heart failure)     Past Surgical History  Procedure Laterality Date  . Cholecystectomy    . Placement of a veriflex bare-metal sten    . Coronary angioplasty with stent placement      VeriFLEX bare-metal stent    History reviewed. No pertinent family history.  Social History:  reports that he quit smoking about 14 years ago. His smoking use included Cigarettes. He has a 50 pack-year smoking history. He has never used smokeless tobacco. He reports that he drinks about 8.4 ounces of alcohol per week. He reports that he does not use illicit drugs.  Allergies:  Allergies  Allergen Reactions  . Lisinopril     REACTION: Cough. Unsure if this is a true allergy. Pt takes this med everyday with out any known side effects per family.    Medications:  Scheduled: . amiodarone  200 mg  Oral Daily  . aspirin EC  81 mg Oral Daily  . carvedilol  6.25 mg Oral Daily  . doxycycline (VIBRAMYCIN) IV  100 mg Intravenous Q12H  . enoxaparin (LOVENOX) injection  40 mg Subcutaneous Q24H  . furosemide  40 mg Oral BID  . insulin aspart  0-9 Units Subcutaneous TID WC  . insulin aspart  3 Units Subcutaneous TID WC  . levothyroxine  25 mcg Oral QAC breakfast  . potassium chloride SA  20 mEq Oral Daily  . sodium chloride  3 mL Intravenous Q12H  . sodium chloride  3 mL Intravenous Q12H  . sodium chloride  3 mL Intravenous Q12H    Results for orders placed during the hospital encounter of 12/06/12 (from the past 48 hour(s))  HEPATIC  FUNCTION PANEL     Status: Abnormal   Collection Time    12/06/12  4:55 PM      Result Value Range   Total Protein 6.0  6.0 - 8.3 g/dL   Albumin 2.9 (*) 3.5 - 5.2 g/dL   AST 33  0 - 37 U/L   ALT 21  0 - 53 U/L   Alkaline Phosphatase 72  39 - 117 U/L   Total Bilirubin 1.6 (*) 0.3 - 1.2 mg/dL   Bilirubin, Direct 0.4 (*) 0.0 - 0.3 mg/dL   Indirect Bilirubin 1.2 (*) 0.3 - 0.9 mg/dL  CBC     Status: Abnormal   Collection Time    12/06/12  4:55 PM      Result Value Range   WBC 9.6  4.0 - 10.5 K/uL   RBC 3.54 (*) 4.22 - 5.81 MIL/uL   Hemoglobin 11.2 (*) 13.0 - 17.0 g/dL   HCT 16.1 (*) 09.6 - 04.5 %   MCV 91.8  78.0 - 100.0 fL   MCH 31.6  26.0 - 34.0 pg   MCHC 34.5  30.0 - 36.0 g/dL   RDW 40.9  81.1 - 91.4 %   Platelets 155  150 - 400 K/uL  CREATININE, SERUM     Status: Abnormal   Collection Time    12/06/12  4:55 PM      Result Value Range   Creatinine, Ser 1.89 (*) 0.50 - 1.35 mg/dL   GFR calc non Af Amer 32 (*) >90 mL/min   GFR calc Af Amer 37 (*) >90 mL/min   Comment:            The eGFR has been calculated     using the CKD EPI equation.     This calculation has not been     validated in all clinical     situations.     eGFR's persistently     <90 mL/min signify     possible Chronic Kidney Disease.  HEMOGLOBIN A1C     Status: Abnormal   Collection Time    12/06/12  4:55 PM      Result Value Range   Hemoglobin A1C 6.0 (*) <5.7 %   Comment: (NOTE)                                                                               According to the ADA Clinical Practice  Recommendations for 2011, when     HbA1c is used as a screening test:      >=6.5%   Diagnostic of Diabetes Mellitus               (if abnormal result is confirmed)     5.7-6.4%   Increased risk of developing Diabetes Mellitus     References:Diagnosis and Classification of Diabetes Mellitus,Diabetes     Care,2011,34(Suppl 1):S62-S69 and Standards of Medical Care in             Diabetes - 2011,Diabetes  Care,2011,34 (Suppl 1):S11-S61.   Mean Plasma Glucose 126 (*) <117 mg/dL  TSH     Status: None   Collection Time    12/06/12  4:55 PM      Result Value Range   TSH 4.213  0.350 - 4.500 uIU/mL  AMMONIA     Status: None   Collection Time    12/06/12  5:01 PM      Result Value Range   Ammonia 20  11 - 60 umol/L  GLUCOSE, CAPILLARY     Status: Abnormal   Collection Time    12/06/12  5:23 PM      Result Value Range   Glucose-Capillary 106 (*) 70 - 99 mg/dL   Comment 1 Documented in Chart     Comment 2 Notify RN    GLUCOSE, CAPILLARY     Status: Abnormal   Collection Time    12/06/12  9:53 PM      Result Value Range   Glucose-Capillary 156 (*) 70 - 99 mg/dL   Comment 1 Notify RN    GLUCOSE, CAPILLARY     Status: None   Collection Time    12/07/12  6:10 AM      Result Value Range   Glucose-Capillary 97  70 - 99 mg/dL  COMPREHENSIVE METABOLIC PANEL     Status: Abnormal   Collection Time    12/07/12  6:25 AM      Result Value Range   Sodium 135  135 - 145 mEq/L   Potassium 3.6  3.5 - 5.1 mEq/L   Comment: DELTA CHECK NOTED   Chloride 100  96 - 112 mEq/L   CO2 25  19 - 32 mEq/L   Glucose, Bld 105 (*) 70 - 99 mg/dL   BUN 24 (*) 6 - 23 mg/dL   Creatinine, Ser 8.29 (*) 0.50 - 1.35 mg/dL   Calcium 8.5  8.4 - 56.2 mg/dL   Total Protein 5.6 (*) 6.0 - 8.3 g/dL   Albumin 2.7 (*) 3.5 - 5.2 g/dL   AST 29  0 - 37 U/L   ALT 20  0 - 53 U/L   Alkaline Phosphatase 67  39 - 117 U/L   Total Bilirubin 1.5 (*) 0.3 - 1.2 mg/dL   GFR calc non Af Amer 30 (*) >90 mL/min   GFR calc Af Amer 35 (*) >90 mL/min   Comment:            The eGFR has been calculated     using the CKD EPI equation.     This calculation has not been     validated in all clinical     situations.     eGFR's persistently     <90 mL/min signify     possible Chronic Kidney Disease.  CBC     Status: Abnormal   Collection Time    12/07/12  6:25 AM      Result  Value Range   WBC 7.4  4.0 - 10.5 K/uL   RBC 3.32 (*) 4.22 -  5.81 MIL/uL   Hemoglobin 10.3 (*) 13.0 - 17.0 g/dL   HCT 14.7 (*) 82.9 - 56.2 %   MCV 91.3  78.0 - 100.0 fL   MCH 31.0  26.0 - 34.0 pg   MCHC 34.0  30.0 - 36.0 g/dL   RDW 13.0  86.5 - 78.4 %   Platelets 144 (*) 150 - 400 K/uL  TROPONIN I     Status: None   Collection Time    12/07/12  9:15 AM      Result Value Range   Troponin I <0.30  <0.30 ng/mL   Comment:            Due to the release kinetics of cTnI,     a negative result within the first hours     of the onset of symptoms does not rule out     myocardial infarction with certainty.     If myocardial infarction is still suspected,     repeat the test at appropriate intervals.  GLUCOSE, CAPILLARY     Status: Abnormal   Collection Time    12/07/12 12:29 PM      Result Value Range   Glucose-Capillary 120 (*) 70 - 99 mg/dL   Comment 1 Notify RN    GLUCOSE, CAPILLARY     Status: Abnormal   Collection Time    12/07/12  3:53 PM      Result Value Range   Glucose-Capillary 116 (*) 70 - 99 mg/dL   Comment 1 Notify RN    GLUCOSE, CAPILLARY     Status: Abnormal   Collection Time    12/07/12  9:05 PM      Result Value Range   Glucose-Capillary 179 (*) 70 - 99 mg/dL   Comment 1 Documented in Chart     Comment 2 Notify RN    GLUCOSE, CAPILLARY     Status: Abnormal   Collection Time    12/08/12  6:35 AM      Result Value Range   Glucose-Capillary 114 (*) 70 - 99 mg/dL  BASIC METABOLIC PANEL     Status: Abnormal   Collection Time    12/08/12  6:53 AM      Result Value Range   Sodium 134 (*) 135 - 145 mEq/L   Potassium 3.9  3.5 - 5.1 mEq/L   Chloride 98  96 - 112 mEq/L   CO2 26  19 - 32 mEq/L   Glucose, Bld 120 (*) 70 - 99 mg/dL   BUN 26 (*) 6 - 23 mg/dL   Creatinine, Ser 6.96 (*) 0.50 - 1.35 mg/dL   Calcium 8.8  8.4 - 29.5 mg/dL   GFR calc non Af Amer 29 (*) >90 mL/min   GFR calc Af Amer 34 (*) >90 mL/min   Comment:            The eGFR has been calculated     using the CKD EPI equation.     This calculation has not  been     validated in all clinical     situations.     eGFR's persistently     <90 mL/min signify     possible Chronic Kidney Disease.  PRO B NATRIURETIC PEPTIDE     Status: Abnormal   Collection Time    12/08/12  6:53 AM      Result Value Range  Pro B Natriuretic peptide (BNP) 8353.0 (*) 0 - 450 pg/mL  GLUCOSE, CAPILLARY     Status: Abnormal   Collection Time    12/08/12 11:09 AM      Result Value Range   Glucose-Capillary 157 (*) 70 - 99 mg/dL   Comment 1 Notify RN      Ct Head Wo Contrast  12/06/2012  *RADIOLOGY REPORT*  Clinical Data: Larey Seat.  Hit head.  CT HEAD WITHOUT CONTRAST  Technique:  Contiguous axial images were obtained from the base of the skull through the vertex without contrast.  Comparison: 08/15/2012.  Findings: Stable age related cerebral atrophy, ventriculomegaly and periventricular white matter disease.  No extra-axial fluid collections are identified.  No CT findings for acute hemispheric infarction and/or intracranial hemorrhage.  No mass lesions.  The brainstem and cerebellum grossly normal and stable.  Vascular calcifications are noted.  Globes are intact.  The bony structures are intact.  No acute skull fracture.  The paranasal sinuses and mastoid air cells are grossly clear.  IMPRESSION:  1.  Age related cerebral atrophy, ventriculomegaly and periventricular white matter disease. 2.  No acute intracranial findings or skull fracture.   Original Report Authenticated By: Rudie Meyer, M.D.    Ct Chest Wo Contrast  12/06/2012  *RADIOLOGY REPORT*  Clinical Data: Larey Seat.  Chest pain.  CT CHEST WITHOUT CONTRAST  Technique:  Multidetector CT imaging of the chest was performed following the standard protocol without IV contrast.  Comparison: None.  Findings: Examination of the chest wall demonstrates a upper sternal fracture without displacement.  There is an associated pre sternal hematoma.  No retrosternal hematoma.  No definite acute rib fractures.  There are remote healed rib  fractures on the left.  No supraclavicular or axillary mass or adenopathy.  The heart is normal in size.  No pericardial effusion.  Advanced atherosclerotic calcifications involving the coronary arteries. The aorta is normal in caliber.  Scattered atherosclerotic calcifications.  No mediastinal or hilar adenopathy.  Small scattered lymph nodes are noted.  The esophagus is grossly normal.  Examination of the lung parenchyma demonstrates changes of peripheral interstitial lung disease, likely UIP.  No pulmonary contusion, pneumothorax, atelectasis, pleural effusion or worrisome lung lesion.  The tracheobronchial tree is unremarkable  The upper abdomen demonstrates calcified granulomas in the liver and spleen.  Gallbladder surgically absent.  Aortic calcifications without aneurysm.  IMPRESSION:  1.  Non depressed upper sternal fracture without substernal or mediastinal hematoma. 2.  Remote healed left rib fractures.  No definite acute rib fractures. 3.  Interstitial lung disease but no acute pulmonary findings. 4.  Advanced atherosclerotic calcifications involving the coronary arteries.   Original Report Authenticated By: Rudie Meyer, M.D.    Review of systems the patient has not been having any chest pain until his fall.  He now has tenderness along the upper sternum and a CT scan of the sternum shows that he has a sternal fracture. Blood pressure 133/66, pulse 67, temperature 97.4 F (36.3 C), temperature source Oral, resp. rate 20, height 5\' 11"  (1.803 m), weight 193 lb 9 oz (87.8 kg), SpO2 95.00%. The patient appears to be in no distress.  Head and neck exam reveals that the pupils are equal and reactive.  The extraocular movements are full.  There is no scleral icterus.  Mouth and pharynx are benign.  No lymphadenopathy.  No carotid bruits.  The jugular venous pressure is normal.  Thyroid is not enlarged or tender.  Chest reveals mild basilar rales  Heart reveals no abnormal lift or heave.  First and  second heart sounds are normal.  There is no murmur gallop rub or click.  The patient has point tenderness in the upper sternum from his sternal fracture.  The abdomen is soft and nontender.  Bowel sounds are normoactive.  There is no hepatosplenomegaly or mass.  There are no abdominal bruits.  Extremities reveal bilateral lower extremity edema worse on the right with changes of stasis dermatitis.  There is a skin wound on the anterior aspect of the right lower leg which has a intact dressing on it and was not examined.  No evidence of DVT  Neurologic exam is normal strength and no lateralizing weakness.  No sensory deficits.  Integument reveals no rash  Two-dimensional echocardiogram done in 12/07/12  Study Conclusions  - Left ventricle: The cavity size was normal. Wall thickness was increased in a pattern of mild LVH. Akinesis of the mid to apical inferoseptal and anteroseptal walls, akinesis of the true apex, Severe hypokinesis of the posterior wall. Hypokinesis of the inferior wall. The estimated ejection fraction was 30%. Doppler parameters are consistent with abnormal left ventricular relaxation (grade 1 diastolic dysfunction). - Aortic valve: There was no stenosis. - Mitral valve: Trivial regurgitation. - Left atrium: The atrium was mildly dilated. - Right ventricle: The cavity size was normal. Systolic function was normal. - Pulmonary arteries: No complete TR doppler jet so unable to estimate PA systolic pressure. - Systemic veins: IVC not visualized. Impressions:  - Technically difficult study. Definity contrast would have helped. EF 30% with wall motion abnormalities as described above. Normal RV size and systolic function. No significant valvular abnormalities.  EKG on 12/08/12 shows normal sinus rhythm with first degree AV block and changes of an old inferolateral and changes of an old anteroseptal myocardial infarction and nonspecific ST-T wave  changes.   Assessment/Plan: 1.  Ischemic heart disease with chronic systolic congestive heart failure, currently compensated on present medications.  The patient appears to be euvolemic clinically.  Pro BNP elevated 8353  No recent angina pectoris and troponins on this admission are normal. 2.  past history of paroxysmal atrial fibrillation, currently in normal sinus rhythm.  Refuses warfarin but has been on aspirin. 3.  chronic dizziness according to family 4.  interstitial lung disease on chest x-ray and CT scan of chest 5.  mild renal insufficiency  Recommendation: Agree with current therapy.  His long-term amiodarone therapy may be contributing to his dizziness and to his interstitial lung disease and I will reduce the dose of amiodarone to just 100 mg daily. Will follow with you.   Melo Stauber 12/08/2012, 1:28 PM

## 2012-12-08 NOTE — Procedures (Signed)
EEG NUMBER:  [id number].  INDICATION FOR STUDY:  An 79 year old man with recurrent falls as well as altered mental status.  Study is being performed to rule out encephalopathy as well as to rule out possible new-onset seizure disorder.  DESCRIPTION:  This is a routine EEG recording performed during wakefulness.  Predominant background activity consisted of low amplitude, mixed delta and theta activity diffusely and symmetrically. Occasional brief runs of 8 to 9 Hz alpha rhythm were recorded from the posterior head regions.  Photic stimulation was not performed. Hyperventilation was not performed.  No epileptiform discharges were recorded.  There were no areas of focal disproportionate slowing of cerebral activity.  INTERPRETATION:  This EEG showed mild generalized nonspecific slowing of cerebral activity.  No evidence of an epileptic disorder was demonstrated.     Noel Christmas, MD    FA:OZHY D:  12/07/2012 12:37:41  T:  12/08/2012 02:16:12  Job #:  [id number]

## 2012-12-08 NOTE — Progress Notes (Signed)
[facility] PROGRESS NOTE  Joel Walker ZOX:[id number] DOB: 07/21/1930 DOA: 12/06/2012 PCP: Kimber Relic, MD  Assessment/Plan: Acute on Chronic CHF  Continue oral lasix,Daily weights, low sodium diet. Continue coreg  -echo on  4/2 with EF of 30%, CVA or hypokinesis of the posterior wall, hypokinesis of the inferior wall and akinesis of the mid to apical inferior septal and anteroseptal walls and akinesis of the true apex -Last echo in 2011 showed EF of 30 - 35%  -I have called back [facility], patient to be seen today. -Cardiac enzymes neg -Improving clinically, Continue diuresis with oral Lasix Syncope  Patient found down, doesn't remember falling  Confused. Urinary incontinence?  2D echo with EF of 30% as above,CT Head negative for acute findings, EEG with no epileptiform activity, continue monitoring on tele  -cardiac enzymes neg  Fall/intermittent vertigo -Pt also reports vertigo, will obtain MRI to eval for posterior circulation infarct  -TSH wnl  -PT / OT evaluation, will likely need SNF   Upper Sternal fracture -non depressed with no substernal or mediastinal hematoma -s/p fall, pain control -with remote old fractures noted - c/w with family's history of pt falling frequently  Cellulitis vs venous stasis of the right lower extremity  - LE dopplers  neg -improving on abx -WOC consult.  Diabetes Mellitus   -continue SSI with meal coverage.  -Carb modified, low salt diet. -continue holding metformin   Paroxysmal Afib  In NSR, Continue Amiodarone.  Await Cards Consult.   Chronic Kidney Disease, Stage III  No sig change in cr with diuresis so far -continue close monitoring on lasix  ?New Lung Nodule in Left Apex -per cxr non-contrast CT with no lung nodule reported interstitial lung disease noted but no acute findings      Code Status: full Family Communication: Disposition Plan: possible SNF when medically  stable   Consultants:  cardiology to see  Procedures:  Echo-results -EF 30%  Antibiotics:  Doxycycline started on 4/2  HPI/Subjective: feeling better this a.m., denies SOB and no CP. Denies vertigo.  Objective: Filed Vitals:   12/08/12 0441 12/08/12 1013 12/08/12 1122 12/08/12 1132  BP: 149/64 142/68 154/74 133/66  Pulse: 69 66  67  Temp: 97.4 F (36.3 C)     TempSrc: Oral     Resp: 20     Height:      Weight: 87.8 kg (193 lb 9 oz)     SpO2: 98%  100% 95%    Intake/Output Summary (Last 24 hours) at 12/08/12 1205 Last data filed at 12/08/12 1050  Gross per 24 hour  Intake    960 ml  Output   2450 ml  Net  -1490 ml   Filed Weights   12/06/12 1545 12/07/12 0507 12/08/12 0441  Weight: 97.7 kg (215 lb 6.2 oz) 90.4 kg (199 lb 4.7 oz) 87.8 kg (193 lb 9 oz)    Exam:   General:  Pt is alert and orientedX2  Cardiovascular: RRR, nl S1S2  Respiratory: decreased BS at bases,o/w clear  Abdomen: soft +BS NT/ND  Extremities: Right lower extremity with a decreasing erythema and +1 edema, wound on lower shin area with dressing clean and dry. left lower extremity with no cyanosis and no edema  Data Reviewed: Basic Metabolic Panel:   Recent Labs Lab 12/06/12 1032 12/06/12 1655 12/07/12 0625 12/08/12 0653  NA 136  --  135 134*  K 4.7  --  3.6 3.9  CL 98  --  100 98  CO2 27  --  25 26  GLUCOSE 131*  --  105* 120*  BUN 25*  --  24* 26*  CREATININE 2.04* 1.89* 1.98* 2.03*  CALCIUM 9.2  --  8.5 8.8   Liver Function Tests:  Recent Labs Lab 12/06/12 1032 12/06/12 1655 12/07/12 0625  AST 35 33 29  ALT 22 21 20   ALKPHOS 80 72 67  BILITOT 1.5* 1.6* 1.5*  PROT 6.5 6.0 5.6*  ALBUMIN 3.2* 2.9* 2.7*   No results found for this basename: LIPASE, AMYLASE,  in the last 168 hours  Recent Labs Lab 12/06/12 1701  AMMONIA 20   CBC:  Recent Labs Lab 12/06/12 1032 12/06/12 1655 12/07/12 0625  WBC 10.1 9.6 7.4  NEUTROABS 7.5  --   --   HGB 11.0* 11.2* 10.3*   HCT 33.1* 32.5* 30.3*  MCV 91.4 91.8 91.3  PLT 166 155 144*   Cardiac Enzymes:  Recent Labs Lab 12/06/12 1051 12/07/12 0915  CKTOTAL 275*  --   CKMB 6.1*  --   TROPONINI  --  <0.30   BNP (last 3 results)  Recent Labs  12/06/12 1051 12/08/12 0653  PROBNP 5232.0* 8353.0*   CBG:  Recent Labs Lab 12/07/12 1229 12/07/12 1553 12/07/12 2105 12/08/12 0635 12/08/12 1109  GLUCAP 120* 116* 179* 114* 157*    No results found for this or any previous visit (from the past 240 hour(s)).   Studies: Ct Head Wo Contrast  12/06/2012  *RADIOLOGY REPORT*  Clinical Data: Larey Seat.  Hit head.  CT HEAD WITHOUT CONTRAST  Technique:  Contiguous axial images were obtained from the base of the skull through the vertex without contrast.  Comparison: 08/15/2012.  Findings: Stable age related cerebral atrophy, ventriculomegaly and periventricular white matter disease.  No extra-axial fluid collections are identified.  No CT findings for acute hemispheric infarction and/or intracranial hemorrhage.  No mass lesions.  The brainstem and cerebellum grossly normal and stable.  Vascular calcifications are noted.  Globes are intact.  The bony structures are intact.  No acute skull fracture.  The paranasal sinuses and mastoid air cells are grossly clear.  IMPRESSION:  1.  Age related cerebral atrophy, ventriculomegaly and periventricular white matter disease. 2.  No acute intracranial findings or skull fracture.   Original Report Authenticated By: Rudie Meyer, M.D.    Ct Chest Wo Contrast  12/06/2012  *RADIOLOGY REPORT*  Clinical Data: Larey Seat.  Chest pain.  CT CHEST WITHOUT CONTRAST  Technique:  Multidetector CT imaging of the chest was performed following the standard protocol without IV contrast.  Comparison: None.  Findings: Examination of the chest wall demonstrates a upper sternal fracture without displacement.  There is an associated pre sternal hematoma.  No retrosternal hematoma.  No definite acute rib  fractures.  There are remote healed rib fractures on the left.  No supraclavicular or axillary mass or adenopathy.  The heart is normal in size.  No pericardial effusion.  Advanced atherosclerotic calcifications involving the coronary arteries. The aorta is normal in caliber.  Scattered atherosclerotic calcifications.  No mediastinal or hilar adenopathy.  Small scattered lymph nodes are noted.  The esophagus is grossly normal.  Examination of the lung parenchyma demonstrates changes of peripheral interstitial lung disease, likely UIP.  No pulmonary contusion, pneumothorax, atelectasis, pleural effusion or worrisome lung lesion.  The tracheobronchial tree is unremarkable  The upper abdomen demonstrates calcified granulomas in the liver and spleen.  Gallbladder surgically absent.  Aortic calcifications without aneurysm.  IMPRESSION:  1.  Non depressed upper sternal fracture without substernal or mediastinal hematoma. 2.  Remote healed left rib fractures.  No definite acute rib fractures. 3.  Interstitial lung disease but no acute pulmonary findings. 4.  Advanced atherosclerotic calcifications involving the coronary arteries.   Original Report Authenticated By: Rudie Meyer, M.D.     Scheduled Meds: . amiodarone  200 mg Oral Daily  . aspirin EC  81 mg Oral Daily  . carvedilol  6.25 mg Oral Daily  . doxycycline (VIBRAMYCIN) IV  100 mg Intravenous Q12H  . enoxaparin (LOVENOX) injection  40 mg Subcutaneous Q24H  . furosemide  40 mg Oral BID  . insulin aspart  0-9 Units Subcutaneous TID WC  . insulin aspart  3 Units Subcutaneous TID WC  . levothyroxine  25 mcg Oral QAC breakfast  . potassium chloride SA  20 mEq Oral Daily  . sodium chloride  3 mL Intravenous Q12H  . sodium chloride  3 mL Intravenous Q12H  . sodium chloride  3 mL Intravenous Q12H   Continuous Infusions:   Active Problems:   Acute on chronic congestive heart failure   Syncope   Fall   Cellulitis of right lower extremity   Lung  nodule   Confusion with non-focal neuro exam   Diabetes mellitus   Paroxysmal atrial fibrillation    Time spent:    [facility]  [facility] Pager [phone]. If 7PM-7AM, please contact night-coverage at [email or web address], password [facility] 12/08/2012, 12:05 PM  LOS: 2 days

## 2012-12-08 NOTE — Clinical Social Work Psychosocial (Signed)
     Clinical Social Work Department BRIEF PSYCHOSOCIAL ASSESSMENT 12/08/2012  Patient:  Joel Walker, Joel Walker     Account Number:  [id number]     Admit date:  12/06/2012  Clinical Social Worker:  Tiburcio Pea  Date/Time:  12/08/2012 05:05 PM  Referred by:  Physician  Date Referred:  12/07/2012 Referred for  SNF Placement   Other Referral:   Interview type:  Other - See comment Other interview type:   Patient, sister Margie and neice Pam    PSYCHOSOCIAL DATA Living Status:  ALONE Admitted from facility:   Level of care:   Primary support name:  Delorise Royals    [phone] Primary support relationship to patient:  CHILD, ADULT Degree of support available:   Strong support    CURRENT CONCERNS Current Concerns  Post-Acute Placement   Other Concerns:    SOCIAL WORK ASSESSMENT / PLAN CSW met with patient today and later with his neice and sister to discuss PT's recommendation for short term SNF. Patient and family are in agreement to SNF placement for short term rehab. Patient lives alone and plans to return home as soon as possible.  Fl2 completed and placed on shadow chart for MD's signature.  Patient's sister will tour facilities this weekend for possible choices.   Assessment/plan status:  Psychosocial Support/Ongoing Assessment of Needs Other assessment/ plan:   Information/referral to community resources:   SNF bed list provided    PATIENTS/FAMILYS RESPONSE TO PLAN OF CARE: Patient is alert, oriented and very pleasant.  Appears to have mild periods of forgetfulness but he is able to compensate.  Patient is agreeable to short term SNF and will work with his sister and neice in choosing a facility. Bed search is in place. CSW will monitor.

## 2012-12-08 NOTE — Clinical Social Work Placement (Addendum)
     Clinical Social Work Department CLINICAL SOCIAL WORK PLACEMENT NOTE 12/11/2012  Patient:  Joel Walker, Joel Walker  Account Number:  [id number] Admit date:  12/06/2012  Clinical Social Worker:  Lupita Leash Clorissa Gruenberg, LCSWA  Date/time:  12/08/2012 06:16 PM  Clinical Social Work is seeking post-discharge placement for this patient at the following level of care:   SKILLED NURSING   (*CSW will update this form in Epic as items are completed)   12/08/2012  Patient/family provided with [facility] Department of Clinical Social Works list of facilities offering this level of care within the geographic area requested by the patient (or if unable, by the patients family).  12/08/2012  Patient/family informed of their freedom to choose among providers that offer the needed level of care, that participate in Medicare, Medicaid or managed care program needed by the patient, have an available bed and are willing to accept the patient.  12/08/2012  Patient/family informed of [facility] ownership interest in [facility], as well as of the fact that they are under no obligation to receive care at this facility.  PASARR submitted to EDS on 12/08/2012 PASARR number received from EDS on 12/08/2012  FL2 transmitted to all facilities in geographic area requested by pt/family on  12/08/2012 FL2 transmitted to all facilities within larger geographic area on   Patient informed that his/her managed care company has contracts with or will negotiate with  certain facilities, including the following:   [facility]     Patient/family informed of bed offers received:  12/08/2012 Patient chooses bed at [facility] Physician recommends and patient chooses bed at    Patient to be transferred to [facility] on  12/11/2012 Patient to be transferred to facility by ambulance  Sharin Mons)  The following physician request were entered in Epic:   Additional Comments: DC to [facility]  12/11/12. Patient and family are pleased with d/c plan.  No futher CSW needs identified.  CSW notified pt's nurse of d/c and discussed with [place] at [place].  CSW signing off.

## 2012-12-08 NOTE — Progress Notes (Signed)
12/08/12 1321 Noted CM referral for Heart Failure Home Health Screen.  PT has recommended [facility] SNF placement.  This NCM spoke with Lupita Leash, CSW, to make aware of SNF placement.  Tera Mater, RN, BSN NCM [phone]

## 2012-12-09 DIAGNOSIS — I5022 Chronic systolic (congestive) heart failure: Secondary | ICD-10-CM

## 2012-12-09 LAB — BASIC METABOLIC PANEL
BUN: 28 mg/dL — ABNORMAL HIGH (ref 6–23)
Calcium: 8.6 mg/dL (ref 8.4–10.5)
GFR calc Af Amer: 36 mL/min — ABNORMAL LOW (ref >90)
GFR calc non Af Amer: 31 mL/min — ABNORMAL LOW (ref >90)
Glucose, Bld: 186 mg/dL — ABNORMAL HIGH (ref 70–99)
Potassium: 3.6 mEq/L (ref 3.5–5.1)
Sodium: 131 mEq/L — ABNORMAL LOW (ref 135–145)

## 2012-12-09 LAB — GLUCOSE, CAPILLARY: Glucose-Capillary: 108 mg/dL — ABNORMAL HIGH (ref 70–99)

## 2012-12-09 MED ORDER — ISOSORBIDE MONONITRATE ER 30 MG PO TB24
30.0000 mg | ORAL_TABLET | Freq: Every day | ORAL | Status: DC
  Administered 2012-12-09 – 2012-12-11 (×3): 30 mg via ORAL
  Filled 2012-12-09 (×3): qty 1

## 2012-12-09 MED ORDER — HYDRALAZINE HCL 25 MG PO TABS
12.5000 mg | ORAL_TABLET | Freq: Three times a day (TID) | ORAL | Status: DC
  Administered 2012-12-09 – 2012-12-11 (×6): 12.5 mg via ORAL
  Filled 2012-12-09 (×10): qty 0.5

## 2012-12-09 NOTE — Progress Notes (Signed)
[facility] PROGRESS NOTE  Joel Walker JWJ:[id number] DOB: 26-Jul-1930 DOA: 12/06/2012 PCP: Kimber Relic, MD  Assessment/Plan: Acute on Chronic CHF  Continue oral lasix,Daily weights, low sodium diet. Continue coreg  -echo on  4/2 with EF of 30%, CVA or hypokinesis of the posterior wall, hypokinesis of the inferior wall and akinesis of the mid to apical inferior septal and anteroseptal walls and akinesis of the true apex -Last echo in 2011 showed EF of 30 - 35%  -appreciate cardiology input. -Cardiac enzymes neg -Improving clinically, Continue diuresis with oral Lasix Syncope  Patient found down, doesn't remember falling  Confused. Urinary incontinence?  2D echo with EF of 30% as above,CT Head negative for acute findings, EEG with no epileptiform activity, continue monitoring on tele  -cardiac enzymes neg  Fall/intermittent vertigo -Pt also reports vertigo, will obtain MRI to eval for posterior circulation infarct  -TSH wnl  -PT / OT evaluation, will likely need SNF   Upper Sternal fracture -non depressed with no substernal or mediastinal hematoma -s/p fall, pain control -with remote old fractures noted - c/w with family's history of pt falling frequently  Cellulitis vs venous stasis of the right lower extremity  - LE dopplers  neg -improving on abx -local wound care.  Diabetes Mellitus   -continue SSI with meal coverage.  -Carb modified, low salt diet. -continue holding metformin   Paroxysmal Afib  In NSR, Continue Amiodarone.  Cards following.   Chronic Kidney Disease, Stage III  No sig change in cr with diuresis so far -continue close monitoring on lasix  ?New Lung Nodule in Left Apex -per cxr non-contrast CT with no lung nodule reported interstitial lung disease noted but no acute findings      Code Status: full Family Communication: Disposition Plan: possible SNF when medically stable   Consultants:  cardiology to  see  Procedures:  Echo-results -EF 30%  Antibiotics:  Doxycycline started on 4/2  HPI/Subjective: doing well this am, denies any complaints.  Objective: Filed Vitals:   12/08/12 1122 12/08/12 1132 12/08/12 1500 12/09/12 0517  BP: 154/74 133/66 140/64 127/64  Pulse:  67 68 60  Temp:   97.3 F (36.3 C) 97.7 F (36.5 C)  TempSrc:   Oral Oral  Resp:   20 18  Height:      Weight:    86.9 kg (191 lb 9.3 oz)  SpO2: 100% 95% 96% 95%    Intake/Output Summary (Last 24 hours) at 12/09/12 0901 Last data filed at 12/09/12 0835  Gross per 24 hour  Intake   1400 ml  Output    825 ml  Net    575 ml   Filed Weights   12/07/12 0507 12/08/12 0441 12/09/12 0517  Weight: 90.4 kg (199 lb 4.7 oz) 87.8 kg (193 lb 9 oz) 86.9 kg (191 lb 9.3 oz)    Exam:   General:  Pt is alert and orientedX2  Cardiovascular: RRR, nl S1S2  Respiratory: decreased BS at bases,o/w clear  Abdomen: soft +BS NT/ND  Extremities: Right lower extremity with a decreased erythema and trace to+1 edema, wound on lower shin area with dressing clean and dry. left lower extremity with no cyanosis and no edema  Data Reviewed: Basic Metabolic Panel:   Recent Labs Lab 12/06/12 1032 12/06/12 1655 12/07/12 0625 12/08/12 0653  NA 136  --  135 134*  K 4.7  --  3.6 3.9  CL 98  --  100 98  CO2 27  --  25  26  GLUCOSE 131*  --  105* 120*  BUN 25*  --  24* 26*  CREATININE 2.04* 1.89* 1.98* 2.03*  CALCIUM 9.2  --  8.5 8.8   Liver Function Tests:  Recent Labs Lab 12/06/12 1032 12/06/12 1655 12/07/12 0625  AST 35 33 29  ALT 22 21 20   ALKPHOS 80 72 67  BILITOT 1.5* 1.6* 1.5*  PROT 6.5 6.0 5.6*  ALBUMIN 3.2* 2.9* 2.7*   No results found for this basename: LIPASE, AMYLASE,  in the last 168 hours  Recent Labs Lab 12/06/12 1701  AMMONIA 20   CBC:  Recent Labs Lab 12/06/12 1032 12/06/12 1655 12/07/12 0625  WBC 10.1 9.6 7.4  NEUTROABS 7.5  --   --   HGB 11.0* 11.2* 10.3*  HCT 33.1* 32.5* 30.3*   MCV 91.4 91.8 91.3  PLT 166 155 144*   Cardiac Enzymes:  Recent Labs Lab 12/06/12 1051 12/07/12 0915  CKTOTAL 275*  --   CKMB 6.1*  --   TROPONINI  --  <0.30   BNP (last 3 results)  Recent Labs  12/06/12 1051 12/08/12 0653  PROBNP 5232.0* 8353.0*   CBG:  Recent Labs Lab 12/08/12 0635 12/08/12 1109 12/08/12 1658 12/08/12 2131 12/09/12 0600  GLUCAP 114* 157* 81 199* 108*    No results found for this or any previous visit (from the past 240 hour(s)).   Studies: Mr Brain Wo Contrast  12/08/2012  *RADIOLOGY REPORT*  Clinical Data: Vertigo.  Evaluate for posterior circulation TIA.  MRI HEAD WITHOUT CONTRAST  Technique:  Multiplanar, multiecho pulse sequences of the brain and surrounding structures were obtained according to standard protocol without intravenous contrast.  Comparison: CT head 12/06/12  Findings: There is no evidence for acute infarction, intracranial hemorrhage, mass lesion, hydrocephalus, or extra-axial fluid. Advanced atrophy.  Fairly extensive chronic microvascular ischemic change.  No midline shift.  Major intracranial vascular structures patent.  No osseous destructive lesion.  Scattered lacunes.Normal pituitary and cerebellar tonsils.  Mild pannus surrounds the odontoid.  Negative orbits.  No acute sinus or mastoid fluid.  IMPRESSION: No acute posterior fossa ischemic change is identified.  Advanced atrophy with chronic microvascular ischemic change.   Original Report Authenticated By: Davonna Belling, M.D.     Scheduled Meds: . amiodarone  100 mg Oral Daily  . aspirin EC  81 mg Oral Daily  . carvedilol  6.25 mg Oral Daily  . doxycycline (VIBRAMYCIN) IV  100 mg Intravenous Q12H  . enoxaparin (LOVENOX) injection  40 mg Subcutaneous Q24H  . furosemide  40 mg Oral BID  . insulin aspart  0-9 Units Subcutaneous TID WC  . insulin aspart  3 Units Subcutaneous TID WC  . levothyroxine  25 mcg Oral QAC breakfast  . potassium chloride SA  20 mEq Oral Daily  . sodium  chloride  3 mL Intravenous Q12H  . sodium chloride  3 mL Intravenous Q12H  . sodium chloride  3 mL Intravenous Q12H   Continuous Infusions:   Active Problems:   Acute on chronic congestive heart failure   Syncope   Fall   Cellulitis of right lower extremity   Lung nodule   Confusion with non-focal neuro exam   Diabetes mellitus   Paroxysmal atrial fibrillation    Time spent:    Kela Millin  [facility] Pager [phone]. If 7PM-7AM, please contact night-coverage at [email or web address], password [facility] 12/09/2012, 9:01 AM  LOS: 3 days

## 2012-12-09 NOTE — Progress Notes (Signed)
Pharmacist Heart Failure Core Measure Documentation  Assessment: Joel Walker has an EF documented as 30% on 12/07/12 by ECHO.  Rationale: Heart failure patients with left ventricular systolic dysfunction (LVSD) and an EF < 40% should be prescribed an angiotensin converting enzyme inhibitor (ACEI) or angiotensin receptor blocker (ARB) at discharge unless a contraindication is documented in the medical record.  This patient is not currently on an ACEI or ARB for HF.  This note is being placed in the record in order to provide documentation that a contraindication to the use of these agents is present for this encounter.  ACE Inhibitor or Angiotensin Receptor Blocker is contraindicated (specify all that apply)  []   ACEI allergy AND ARB allergy []   Angioedema []   Moderate or severe aortic stenosis []   Hyperkalemia []   Hypotension []   Renal artery stenosis [x]   Worsening renal function, preexisting renal disease or dysfunction   Abran Duke 12/09/2012 2:08 PM

## 2012-12-09 NOTE — Progress Notes (Signed)
Patient ID: Joel Walker, male   DOB: [birth year], 79 y.o.   MRN: [id number]    SUBJECTIVE: No complaints, no dyspnea.  No telemetry events.   Marland Kitchen amiodarone  100 mg Oral Daily  . aspirin EC  81 mg Oral Daily  . carvedilol  6.25 mg Oral Daily  . doxycycline (VIBRAMYCIN) IV  100 mg Intravenous Q12H  . enoxaparin (LOVENOX) injection  40 mg Subcutaneous Q24H  . furosemide  40 mg Oral BID  . insulin aspart  0-9 Units Subcutaneous TID WC  . insulin aspart  3 Units Subcutaneous TID WC  . levothyroxine  25 mcg Oral QAC breakfast  . potassium chloride SA  20 mEq Oral Daily  . sodium chloride  3 mL Intravenous Q12H  . sodium chloride  3 mL Intravenous Q12H  . sodium chloride  3 mL Intravenous Q12H      Filed Vitals:   12/08/12 1122 12/08/12 1132 12/08/12 1500 12/09/12 0517  BP: 154/74 133/66 140/64 127/64  Pulse:  67 68 60  Temp:   97.3 F (36.3 C) 97.7 F (36.5 C)  TempSrc:   Oral Oral  Resp:   20 18  Height:      Weight:    191 lb 9.3 oz (86.9 kg)  SpO2: 100% 95% 96% 95%    Intake/Output Summary (Last 24 hours) at 12/09/12 1046 Last data filed at 12/09/12 0835  Gross per 24 hour  Intake   1400 ml  Output    825 ml  Net    575 ml    LABS: Basic Metabolic Panel:  Recent Labs  59/56/38 0625 12/08/12 0653  NA 135 134*  K 3.6 3.9  CL 100 98  CO2 25 26  GLUCOSE 105* 120*  BUN 24* 26*  CREATININE 1.98* 2.03*  CALCIUM 8.5 8.8   Liver Function Tests:  Recent Labs  12/06/12 1655 12/07/12 0625  AST 33 29  ALT 21 20  ALKPHOS 72 67  BILITOT 1.6* 1.5*  PROT 6.0 5.6*  ALBUMIN 2.9* 2.7*   No results found for this basename: LIPASE, AMYLASE,  in the last 72 hours CBC:  Recent Labs  12/06/12 1655 12/07/12 0625  WBC 9.6 7.4  HGB 11.2* 10.3*  HCT 32.5* 30.3*  MCV 91.8 91.3  PLT 155 144*   Cardiac Enzymes:  Recent Labs  12/06/12 1051 12/07/12 0915  CKTOTAL 275*  --   CKMB 6.1*  --   TROPONINI  --  <0.30   BNP: No components found with this  basename: POCBNP,  D-Dimer: No results found for this basename: DDIMER,  in the last 72 hours Hemoglobin A1C:  Recent Labs  12/06/12 1655  HGBA1C 6.0*   Fasting Lipid Panel: No results found for this basename: CHOL, HDL, LDLCALC, TRIG, CHOLHDL, LDLDIRECT,  in the last 72 hours Thyroid Function Tests:  Recent Labs  12/06/12 1655  TSH 4.213   Anemia Panel: No results found for this basename: VITAMINB12, FOLATE, FERRITIN, TIBC, IRON, RETICCTPCT,  in the last 72 hours  RADIOLOGY: Dg Chest 2 View  12/06/2012  *RADIOLOGY REPORT*  Clinical Data: Mid chest pain  CHEST - 2 VIEW  Comparison: 05/13/2009  Findings: Low volume film with asymmetric elevation of the right hemidiaphragm.  The Cardiopericardial silhouette is at upper limits of normal for size. Interstitial markings are diffusely coarsened with chronic features.  No pleural effusion.  Biapical pleural thickening again noted with new area of nodularity in the left apex.  Imaged bony structures of  the thorax are intact.  IMPRESSION: New nodularity in the subpleural left apex.  CT chest without contrast recommended to further evaluate.  Underlying chronic interstitial changes as before.   Original Report Authenticated By: Kennith Center, M.D.    Ct Head Wo Contrast  12/06/2012  *RADIOLOGY REPORT*  Clinical Data: Larey Seat.  Hit head.  CT HEAD WITHOUT CONTRAST  Technique:  Contiguous axial images were obtained from the base of the skull through the vertex without contrast.  Comparison: 08/15/2012.  Findings: Stable age related cerebral atrophy, ventriculomegaly and periventricular white matter disease.  No extra-axial fluid collections are identified.  No CT findings for acute hemispheric infarction and/or intracranial hemorrhage.  No mass lesions.  The brainstem and cerebellum grossly normal and stable.  Vascular calcifications are noted.  Globes are intact.  The bony structures are intact.  No acute skull fracture.  The paranasal sinuses and mastoid  air cells are grossly clear.  IMPRESSION:  1.  Age related cerebral atrophy, ventriculomegaly and periventricular white matter disease. 2.  No acute intracranial findings or skull fracture.   Original Report Authenticated By: Rudie Meyer, M.D.    Ct Chest Wo Contrast  12/06/2012  *RADIOLOGY REPORT*  Clinical Data: Larey Seat.  Chest pain.  CT CHEST WITHOUT CONTRAST  Technique:  Multidetector CT imaging of the chest was performed following the standard protocol without IV contrast.  Comparison: None.  Findings: Examination of the chest wall demonstrates a upper sternal fracture without displacement.  There is an associated pre sternal hematoma.  No retrosternal hematoma.  No definite acute rib fractures.  There are remote healed rib fractures on the left.  No supraclavicular or axillary mass or adenopathy.  The heart is normal in size.  No pericardial effusion.  Advanced atherosclerotic calcifications involving the coronary arteries. The aorta is normal in caliber.  Scattered atherosclerotic calcifications.  No mediastinal or hilar adenopathy.  Small scattered lymph nodes are noted.  The esophagus is grossly normal.  Examination of the lung parenchyma demonstrates changes of peripheral interstitial lung disease, likely UIP.  No pulmonary contusion, pneumothorax, atelectasis, pleural effusion or worrisome lung lesion.  The tracheobronchial tree is unremarkable  The upper abdomen demonstrates calcified granulomas in the liver and spleen.  Gallbladder surgically absent.  Aortic calcifications without aneurysm.  IMPRESSION:  1.  Non depressed upper sternal fracture without substernal or mediastinal hematoma. 2.  Remote healed left rib fractures.  No definite acute rib fractures. 3.  Interstitial lung disease but no acute pulmonary findings. 4.  Advanced atherosclerotic calcifications involving the coronary arteries.   Original Report Authenticated By: Rudie Meyer, M.D.    Mr Brain Wo Contrast  12/08/2012  *RADIOLOGY  REPORT*  Clinical Data: Vertigo.  Evaluate for posterior circulation TIA.  MRI HEAD WITHOUT CONTRAST  Technique:  Multiplanar, multiecho pulse sequences of the brain and surrounding structures were obtained according to standard protocol without intravenous contrast.  Comparison: CT head 12/06/12  Findings: There is no evidence for acute infarction, intracranial hemorrhage, mass lesion, hydrocephalus, or extra-axial fluid. Advanced atrophy.  Fairly extensive chronic microvascular ischemic change.  No midline shift.  Major intracranial vascular structures patent.  No osseous destructive lesion.  Scattered lacunes.Normal pituitary and cerebellar tonsils.  Mild pannus surrounds the odontoid.  Negative orbits.  No acute sinus or mastoid fluid.  IMPRESSION: No acute posterior fossa ischemic change is identified.  Advanced atrophy with chronic microvascular ischemic change.   Original Report Authenticated By: Davonna Belling, M.D.     PHYSICAL EXAM General: NAD  Neck: No JVD, no thyromegaly or thyroid nodule.  Lungs: Clear to auscultation bilaterally with normal respiratory effort. CV: Nondisplaced PMI.  Heart regular S1/S2, no S3/S4, no murmur.  No peripheral edema.  No carotid bruit.  Normal pedal pulses.  Abdomen: Soft, nontender, no hepatosplenomegaly, no distention.  Neurologic: Alert and oriented x 3.  Psych: Normal affect. Extremities: No clubbing or cyanosis.   TELEMETRY: Reviewed telemetry pt in NSR  ASSESSMENT AND PLAN: 79 yo with history of CAD, ischemic CMP (EF 30%), LV apical thrombus, CKD, PAF presented after syncopal episode. 1. Syncope: ? Etiology.  Arrhythmia certainly possible. He has a low EF but has not wanted an ICD in the past and probably is not a great candidate for one.  He is on amiodarone for PAF.  Would favor 30 day monitor as outpatient to monitor for bradycardia or VT.  2. CHF: Chronic systolic CHF.  EF 30% which is stable (difficult echo).  He does not appear significantly volume  overloaded.  Continue po Lasix and Coreg.  Given CKD, would avoid ACEI.  Will add hydralazine/nitrates but ? Long-term compliance with this regimen.  3. PAF: Has been in NSR.  Continue lower dose of amiodarone.  Has not been compliant with coumadin.  Also significant fall risk. Continue ASA.  Could reassess coumadin use if he goes to SNF.  4. H/o LV thrombus: Not noted on current echo but apex not well-visualized.  5. CKD: Stable.  6. Agree with plan for SNF.   Marca Ancona 12/09/2012 10:52 AM

## 2012-12-10 ENCOUNTER — Other Ambulatory Visit: Payer: Self-pay | Admitting: Internal Medicine

## 2012-12-10 LAB — GLUCOSE, CAPILLARY
Glucose-Capillary: 133 mg/dL — ABNORMAL HIGH (ref 70–99)
Glucose-Capillary: 117 mg/dL — ABNORMAL HIGH (ref 70–99)
Glucose-Capillary: 111 mg/dL — ABNORMAL HIGH (ref 70–99)
Glucose-Capillary: 136 mg/dL — ABNORMAL HIGH (ref 70–99)

## 2012-12-10 LAB — BASIC METABOLIC PANEL
BUN: 33 mg/dL — ABNORMAL HIGH (ref 6–23)
CO2: 26 mEq/L (ref 19–32)
Calcium: 8.9 mg/dL (ref 8.4–10.5)
Chloride: 96 mEq/L (ref 96–112)
Creatinine, Ser: 2.21 mg/dL — ABNORMAL HIGH (ref 0.50–1.35)

## 2012-12-10 NOTE — Progress Notes (Signed)
[facility] PROGRESS NOTE  ANTIONO ETTINGER ZOX:[id number] DOB: 1930-06-20 DOA: 12/06/2012 PCP: Kimber Relic, MD  Assessment/Plan: Acute on Chronic CHF  Continue oral lasix,Daily weights, low sodium diet. Continue coreg, with hydralazine and nitrates  -echo on  4/2 with EF of 30%, CVA or hypokinesis of the posterior wall, hypokinesis of the inferior wall and akinesis of the mid to apical inferior septal and anteroseptal walls and akinesis of the true apex -Last echo in 2011 showed EF of 30 - 35%  -appreciate cardiology input. -Cardiac enzymes neg -clinically ipmroved, bump in cr today - agree with holding lasix Syncope  Patient found down, doesn't remember falling  Confused. Urinary incontinence?  2D echo with EF of 30% as above,CT Head negative for acute findings, EEG with no epileptiform activity, continue monitoring on tele  -cardiac enzymes neg -cardiology to consider 30day monitoring at d/c Fall/intermittent vertigo -Pt also reports vertigo, will obtain MRI to eval for posterior circulation infarct  -TSH wnl  -PT / OT evaluation, will likely need SNF   Upper Sternal fracture -non depressed with no substernal or mediastinal hematoma -s/p fall, pain control -with remote old fractures noted - c/w with family's history of pt falling frequently  Cellulitis vs venous stasis of the right lower extremity  - LE dopplers  neg -improving on abx -local wound care.  Diabetes Mellitus   -continue SSI with meal coverage.  -Carb modified, low salt diet. -continue holding metformin   Paroxysmal Afib  In NSR, Continue Amiodarone.  Cards following.   Chronic Kidney Disease, Stage III  bump in cr today, holding lasix as above -continue close monitoring, recheck in am  ?New Lung Nodule in Left Apex -per cxr non-contrast CT with no lung nodule reported interstitial lung disease noted but no acute findings      Code Status: full Family Communication: Disposition Plan:  possible SNF when medically stable   Consultants:  cardiology to see  Procedures:  Echo-results -EF 30%  Antibiotics:  Doxycycline started on 4/2  HPI/Subjective:  denies any complaints. Per nsg ambulated in hallway yesterday.  Objective: Filed Vitals:   12/09/12 1533 12/09/12 2109 12/10/12 0549 12/10/12 0700  BP: 121/66 110/52 125/58   Pulse:  62 59   Temp:  98.1 F (36.7 C) 98.6 F (37 C)   TempSrc:  Oral Oral   Resp:  19 19   Height:      Weight:    89.268 kg (196 lb 12.8 oz)  SpO2:  98% 98%     Intake/Output Summary (Last 24 hours) at 12/10/12 0929 Last data filed at 12/10/12 0803  Gross per 24 hour  Intake   2480 ml  Output   1500 ml  Net    980 ml   Filed Weights   12/08/12 0441 12/09/12 0517 12/10/12 0700  Weight: 87.8 kg (193 lb 9 oz) 86.9 kg (191 lb 9.3 oz) 89.268 kg (196 lb 12.8 oz)    Exam:   General:  Pt is alert and orientedX2  Cardiovascular: RRR, nl S1S2  Respiratory: decreased BS at bases,o/w clear  Abdomen: soft +BS NT/ND  Extremities: Right lower extremity with a decreased erythema and trace to+1 edema, wound on lower shin area with dressing clean and dry. left lower extremity with no cyanosis and no edema  Data Reviewed: Basic Metabolic Panel:   Recent Labs Lab 12/06/12 1032 12/06/12 1655 12/07/12 0625 12/08/12 0653 12/09/12 0952 12/10/12 0425  NA 136  --  135 134* 131* 132*  K 4.7  --  3.6 3.9 3.6 3.8  CL 98  --  100 98 97 96  CO2 27  --  25 26 24 26   GLUCOSE 131*  --  105* 120* 186* 105*  BUN 25*  --  24* 26* 28* 33*  CREATININE 2.04* 1.89* 1.98* 2.03* 1.94* 2.21*  CALCIUM 9.2  --  8.5 8.8 8.6 8.9   Liver Function Tests:  Recent Labs Lab 12/06/12 1032 12/06/12 1655 12/07/12 0625  AST 35 33 29  ALT 22 21 20   ALKPHOS 80 72 67  BILITOT 1.5* 1.6* 1.5*  PROT 6.5 6.0 5.6*  ALBUMIN 3.2* 2.9* 2.7*   No results found for this basename: LIPASE, AMYLASE,  in the last 168 hours  Recent Labs Lab 12/06/12 1701   AMMONIA 20   CBC:  Recent Labs Lab 12/06/12 1032 12/06/12 1655 12/07/12 0625  WBC 10.1 9.6 7.4  NEUTROABS 7.5  --   --   HGB 11.0* 11.2* 10.3*  HCT 33.1* 32.5* 30.3*  MCV 91.4 91.8 91.3  PLT 166 155 144*   Cardiac Enzymes:  Recent Labs Lab 12/06/12 1051 12/07/12 0915  CKTOTAL 275*  --   CKMB 6.1*  --   TROPONINI  --  <0.30   BNP (last 3 results)  Recent Labs  12/06/12 1051 12/08/12 0653  PROBNP 5232.0* 8353.0*   CBG:  Recent Labs Lab 12/08/12 1109 12/08/12 1658 12/08/12 2131 12/09/12 0600 12/09/12 1113  GLUCAP 157* 81 199* 108* 162*    No results found for this or any previous visit (from the past 240 hour(s)).   Studies: Mr Brain Wo Contrast  12/08/2012  *RADIOLOGY REPORT*  Clinical Data: Vertigo.  Evaluate for posterior circulation TIA.  MRI HEAD WITHOUT CONTRAST  Technique:  Multiplanar, multiecho pulse sequences of the brain and surrounding structures were obtained according to standard protocol without intravenous contrast.  Comparison: CT head 12/06/12  Findings: There is no evidence for acute infarction, intracranial hemorrhage, mass lesion, hydrocephalus, or extra-axial fluid. Advanced atrophy.  Fairly extensive chronic microvascular ischemic change.  No midline shift.  Major intracranial vascular structures patent.  No osseous destructive lesion.  Scattered lacunes.Normal pituitary and cerebellar tonsils.  Mild pannus surrounds the odontoid.  Negative orbits.  No acute sinus or mastoid fluid.  IMPRESSION: No acute posterior fossa ischemic change is identified.  Advanced atrophy with chronic microvascular ischemic change.   Original Report Authenticated By: Davonna Belling, M.D.     Scheduled Meds: . amiodarone  100 mg Oral Daily  . aspirin EC  81 mg Oral Daily  . carvedilol  6.25 mg Oral Daily  . doxycycline (VIBRAMYCIN) IV  100 mg Intravenous Q12H  . enoxaparin (LOVENOX) injection  40 mg Subcutaneous Q24H  . furosemide  40 mg Oral BID  . hydrALAZINE   12.5 mg Oral TID  . insulin aspart  0-9 Units Subcutaneous TID WC  . insulin aspart  3 Units Subcutaneous TID WC  . isosorbide mononitrate  30 mg Oral Daily  . levothyroxine  25 mcg Oral QAC breakfast  . potassium chloride SA  20 mEq Oral Daily  . sodium chloride  3 mL Intravenous Q12H  . sodium chloride  3 mL Intravenous Q12H  . sodium chloride  3 mL Intravenous Q12H   Continuous Infusions:   Active Problems:   Acute on chronic congestive heart failure   Syncope   Fall   Cellulitis of right lower extremity   Lung nodule   Confusion with non-focal neuro  exam   Diabetes mellitus   Paroxysmal atrial fibrillation    Time spent:    Kela Millin  [facility] Pager [phone]. If 7PM-7AM, please contact night-coverage at [email or web address], password [facility] 12/10/2012, 9:29 AM  LOS: 4 days

## 2012-12-10 NOTE — Progress Notes (Signed)
Patient ID: Joel Walker, male   DOB: [birth year], 79 y.o.   MRN: [id number]    SUBJECTIVE: No complaints, no dyspnea.  No telemetry events.   Marland Kitchen amiodarone  100 mg Oral Daily  . aspirin EC  81 mg Oral Daily  . carvedilol  6.25 mg Oral Daily  . doxycycline (VIBRAMYCIN) IV  100 mg Intravenous Q12H  . enoxaparin (LOVENOX) injection  40 mg Subcutaneous Q24H  . furosemide  40 mg Oral BID  . hydrALAZINE  12.5 mg Oral TID  . insulin aspart  0-9 Units Subcutaneous TID WC  . insulin aspart  3 Units Subcutaneous TID WC  . isosorbide mononitrate  30 mg Oral Daily  . levothyroxine  25 mcg Oral QAC breakfast  . potassium chloride SA  20 mEq Oral Daily  . sodium chloride  3 mL Intravenous Q12H  . sodium chloride  3 mL Intravenous Q12H  . sodium chloride  3 mL Intravenous Q12H      Filed Vitals:   12/09/12 2109 12/10/12 0549 12/10/12 0700 12/10/12 0926  BP: 110/52 125/58  137/65  Pulse: 62 59    Temp: 98.1 F (36.7 C) 98.6 F (37 C)    TempSrc: Oral Oral    Resp: 19 19    Height:      Weight:   196 lb 12.8 oz (89.268 kg)   SpO2: 98% 98%      Intake/Output Summary (Last 24 hours) at 12/10/12 0952 Last data filed at 12/10/12 0803  Gross per 24 hour  Intake   2480 ml  Output   1500 ml  Net    980 ml    LABS: Basic Metabolic Panel:  Recent Labs  40/98/11 0952 12/10/12 0425  NA 131* 132*  K 3.6 3.8  CL 97 96  CO2 24 26  GLUCOSE 186* 105*  BUN 28* 33*  CREATININE 1.94* 2.21*  CALCIUM 8.6 8.9    PHYSICAL EXAM General: NAD, dissheveled and chronically ill appearing, sleeping but rouses, Neck: No JVD, no thyromegaly or thyroid nodule.  Lungs: Clear to auscultation bilaterally with normal respiratory effort. CV: Nondisplaced PMI.  Heart regular S1/S2, no S3/S4, no murmur.  No peripheral edema.  No carotid bruit.  Normal pedal pulses.  Abdomen: Soft, nontender, no hepatosplenomegaly, no distention.  Neurologic: Alert and oriented x 3.  Psych: Normal  affect. Extremities: No clubbing or cyanosis.   TELEMETRY: Reviewed telemetry pt in NSR  ASSESSMENT AND PLAN: 79 yo with history of CAD, ischemic CMP (EF 30%), LV apical thrombus, CKD, PAF presented after syncopal episode. 1. Syncope:  Uncertain etiology in this pt of advanced age with multiple comorbidities.  The differential is broad.  He has had no arrhythmias on telemetry.  He is not a candidate for ICD or avanced EP procedures.  Could consider 30 monitor at discharge vs supportive care, which would also be quite reasonable. 2. CHF: Chronic systolic CHF.  EF 30% which is stable (difficult echo).  He appears slightly dry today.  Will hold lasix today given worsening creatinine.  With CKD, would avoid ACEI.  Will add hydralazine/nitrates but ? Long-term compliance with this regimen.  3. PAF: Has been in NSR.  Continue lower dose of amiodarone. Anticoagulation has been difficult previously due to compliance. 4. H/o LV thrombus: Not noted on current echo but apex not well-visualized.  5. CKD: Stable.  6. Agree with plan for SNF.    Fayrene Fearing Darrielle Pflieger 12/10/2012 9:52 AM

## 2012-12-11 DIAGNOSIS — I428 Other cardiomyopathies: Secondary | ICD-10-CM

## 2012-12-11 DIAGNOSIS — I10 Essential (primary) hypertension: Secondary | ICD-10-CM

## 2012-12-11 LAB — GLUCOSE, CAPILLARY: Glucose-Capillary: 132 mg/dL — ABNORMAL HIGH (ref 70–99)

## 2012-12-11 LAB — BASIC METABOLIC PANEL
CO2: 24 mEq/L (ref 19–32)
Calcium: 8.8 mg/dL (ref 8.4–10.5)
Chloride: 97 mEq/L (ref 96–112)
Glucose, Bld: 110 mg/dL — ABNORMAL HIGH (ref 70–99)
Potassium: 3.5 mEq/L (ref 3.5–5.1)
Sodium: 134 mEq/L — ABNORMAL LOW (ref 135–145)

## 2012-12-11 MED ORDER — FUROSEMIDE 10 MG/ML IJ SOLN: 40.0000 mg | Freq: Once | INTRAMUSCULAR | Status: DC

## 2012-12-11 MED ORDER — ISOSORBIDE MONONITRATE ER 30 MG PO TB24: 30.0000 mg | ORAL_TABLET | Freq: Every day | ORAL | Status: DC

## 2012-12-11 MED ORDER — HYDRALAZINE HCL 25 MG PO TABS: 12.5000 mg | ORAL_TABLET | Freq: Three times a day (TID) | ORAL | Status: DC

## 2012-12-11 MED ORDER — AMIODARONE HCL 200 MG PO TABS: 100.0000 mg | ORAL_TABLET | Freq: Every day | ORAL | Status: DC

## 2012-12-11 MED ORDER — DOXYCYCLINE HYCLATE 100 MG PO TABS
100.0000 mg | ORAL_TABLET | Freq: Two times a day (BID) | ORAL | Status: DC
  Filled 2012-12-11 (×2): qty 1

## 2012-12-11 MED ORDER — DOXYCYCLINE HYCLATE 100 MG PO TABS: 100.0000 mg | ORAL_TABLET | Freq: Two times a day (BID) | ORAL | Status: DC

## 2012-12-11 MED ORDER — FUROSEMIDE 20 MG PO TABS: 20.0000 mg | ORAL_TABLET | Freq: Every day | ORAL | Status: DC

## 2012-12-11 MED ORDER — INSULIN ASPART 100 UNIT/ML ~~LOC~~ SOLN: 0.0000 [IU] | Freq: Three times a day (TID) | SUBCUTANEOUS | Status: DC

## 2012-12-11 MED ORDER — ENOXAPARIN SODIUM 30 MG/0.3ML ~~LOC~~ SOLN
30.0000 mg | SUBCUTANEOUS | Status: DC
  Filled 2012-12-11: qty 0.3

## 2012-12-11 MED ORDER — INSULIN ASPART 100 UNIT/ML ~~LOC~~ SOLN: 3.0000 [IU] | Freq: Three times a day (TID) | SUBCUTANEOUS | Status: DC

## 2012-12-11 NOTE — Progress Notes (Signed)
Discharged per ambulance . Packet given to ambulance attendant. Pt has all belongings. Sister aware of transfer. Called report to [facility] nursing center.

## 2012-12-11 NOTE — Plan of Care (Signed)
Problem: Phase III Progression Outcomes Goal: Activity at appropriate level-compared to baseline (UP IN CHAIR FOR HEMODIALYSIS)  Outcome: Completed/Met Date Met:  12/11/12 Pt still deconditioned and weak will go to [facility] for rehab

## 2012-12-11 NOTE — Progress Notes (Signed)
SUBJECTIVE: No complaints this am.   BP 152/62  Pulse 63  Temp(Src) 97.2 F (36.2 C) (Oral)  Resp 20  Ht 5\' 11"  (1.803 m)  Wt 197 lb 12 oz (89.7 kg)  BMI 27.59 kg/m2  SpO2 100%  Intake/Output Summary (Last 24 hours) at 12/11/12 0753 Last data filed at 12/11/12 0500  Gross per 24 hour  Intake   1190 ml  Output   1501 ml  Net   -311 ml    PHYSICAL EXAM General: Well developed, well nourished, in no acute distress. Alert.   Psych:  Good affect, responds appropriately Neck: No JVD. No masses noted.  Lungs: Clear bilaterally with no wheezes or rhonci noted.  Heart: Huston Foley with no murmurs noted. Abdomen: Bowel sounds are present. Soft, non-tender.  Extremities: No lower extremity edema.   LABS: Basic Metabolic Panel:  Recent Labs  09/81/19 0425 12/11/12 0530  NA 132* 134*  K 3.8 3.5  CL 96 97  CO2 26 24  GLUCOSE 105* 110*  BUN 33* 41*  CREATININE 2.21* 2.22*  CALCIUM 8.9 8.8   Current Meds: . amiodarone  100 mg Oral Daily  . aspirin EC  81 mg Oral Daily  . carvedilol  6.25 mg Oral Daily  . doxycycline (VIBRAMYCIN) IV  100 mg Intravenous Q12H  . enoxaparin (LOVENOX) injection  40 mg Subcutaneous Q24H  . hydrALAZINE  12.5 mg Oral TID  . insulin aspart  0-9 Units Subcutaneous TID WC  . insulin aspart  3 Units Subcutaneous TID WC  . isosorbide mononitrate  30 mg Oral Daily  . levothyroxine  25 mcg Oral QAC breakfast  . potassium chloride SA  20 mEq Oral Daily  . sodium chloride  3 mL Intravenous Q12H  . sodium chloride  3 mL Intravenous Q12H  . sodium chloride  3 mL Intravenous Q12H   TELEMETRY: Reviewed telemetry pt in sinus brady, rates 55-60.  Echo 12/07/12:  Left ventricle: The cavity size was normal. Wall thickness was increased in a pattern of mild LVH. Akinesis of the mid to apical inferoseptal and anteroseptal walls, akinesis of the true apex, Severe hypokinesis of the posterior wall. Hypokinesis of the inferior wall. The estimated ejection  fraction was 30%. Doppler parameters are consistent with abnormal left ventricular relaxation (grade 1 diastolic dysfunction). - Aortic valve: There was no stenosis. - Mitral valve: Trivial regurgitation. - Left atrium: The atrium was mildly dilated. - Right ventricle: The cavity size was normal. Systolic function was normal. - Pulmonary arteries: No complete TR doppler jet so unable to estimate PA systolic pressure. - Systemic veins: IVC not visualized. Impressions:  - Technically difficult study. Definity contrast would have helped. EF 30% with wall motion abnormalities as described above. Normal RV size and systolic function. No significant valvular abnormalities.  ASSESSMENT AND PLAN: 79 yo with history of CAD, ischemic CMP (EF 30%), LV apical thrombus, CKD, PAF presented after syncopal episode.   1. Syncope: Uncertain etiology in this pt of advanced age with multiple comorbidities. The differential is broad. He has had no arrhythmias on telemetry. He is not a candidate for ICD or avanced EP procedures. I would not recommend outpatient monitor at this time. I think conservative therapy is appropriate in this elderly gentleman.   2. Ischemic cardiomyopathy/Acute on chronic systolic CHF: LVEF 30% which is stable.  Volume status appears to be at baseline.  Would hold lasix today and restart tomorrow at lower dose, 20 mg per day. Renal function is near  baseline. With CKD, would avoid ACEI. Continue hydralazine for afterload reduction.   3. Paroxysmal atrial fibrillation: Sinus brady this am.  Continue lower dose of amiodarone. Anticoagulation has been difficult previously due to compliance. Would not restart long term anti-coagulation. I have been following Mr. Beavers for years and he is non-compliant with meds and outpatient f/u.   4. H/o LV thrombus: Not noted on current echo but apex not well-visualized. As above, not a candidate for long term anti-coagulation.   5. Chronic kidney  disease, stage III: Stable.   6. Dispo: Planning in place for SNF which is appropriate. I think he is ok for d/c from cardiac standpoint. Pt should consider code status. I think discussing DNR with his family would be appropriate.    MCALHANY,CHRISTOPHER  4/7/20147:53 AM

## 2012-12-11 NOTE — Progress Notes (Signed)
Physical Therapy Treatment Note   12/11/12 0928  PT Visit Information  Last PT Received On 12/11/12  Assistance Needed +1  PT Time Calculation  PT Start Time 0928  PT Stop Time 0952  PT Time Calculation (min) 24 min  Subjective Data  Subjective Pt received supine in bed with urinary incontinence. Pt denies being incontinent.  Precautions  Precautions Fall  Restrictions  Weight Bearing Restrictions No  Cognition  Overall Cognitive Status Impaired  Area of Impairment Memory;Safety/judgement  Arousal/Alertness Awake/alert  Orientation Level Disoriented to;Time  Behavior During Session [facility] for tasks performed  Memory Deficits unable to recall date despite max reminders  Following Commands (extremely hard of hearing)  Bed Mobility  Bed Mobility Supine to Sit  Supine to Sit 4: Min assist;With rails;HOB flat  Details for Bed Mobility Assistance max directional v/c's  Transfers  Transfers Sit to Stand;Stand to Sit  Sit to Stand 4: Min assist;From bed;With upper extremity assist  Stand to Sit 4: Min assist;To chair/3-in-1;With upper extremity assist  Details for Transfer Assistance max directional v/c's  Ambulation/Gait  Ambulation/Gait Assistance 4: Min assist  Ambulation Distance (Feet) 50 Feet  Assistive device Rolling walker  Ambulation/Gait Assistance Details increased trunk flexion, shuffled gait pattern, max v/c's to look up  Gait Pattern Shuffle;Trunk flexed;Decreased hip/knee flexion - right;Decreased hip/knee flexion - left;Decreased trunk rotation  Gait velocity slow  Stairs No  PT - End of Session  Equipment Utilized During Treatment Gait belt  Activity Tolerance Patient tolerated treatment well  Patient left in chair;with call bell/phone within reach  Nurse Communication Mobility status  PT - Assessment/Plan  Comments on Treatment Session Pt progressing well towards all goals but remains confused with decreased insight to deficts and safety awareness. Pt to con't to  benefit from SNF upon d/c to maximize functional recovery.  PT Plan Discharge plan remains appropriate  PT Frequency Min 3X/week  Follow Up Recommendations SNF  PT equipment None recommended by PT  Acute Rehab PT Goals  PT Goal: Supine/Side to Sit - Progress Progressing toward goal  PT Goal: Sit to Stand - Progress Progressing toward goal  PT Goal: Stand to Sit - Progress Progressing toward goal  PT Goal: Ambulate - Progress Progressing toward goal  PT General Charges  $$ ACUTE PT VISIT 1 Procedure  PT Treatments  $Gait Training 8-22 mins  $Therapeutic Activity 8-22 mins    Lewis Shock, PT, DPT Pager #: [phone] Office #: [phone]

## 2012-12-11 NOTE — Progress Notes (Signed)
Occupational Therapy Treatment Patient Details Name: Joel Walker MRN: [id number] DOB: July 18, 1930 Today's Date: 12/11/2012 Time: 1191-4782 OT Time Calculation (min): 18 min  OT Assessment / Plan / Recommendation Comments on Treatment Session Pt was able to perform  ADLs requested with min A- Mod A.  Needed several VCs, decreased safety, unable to safely reach out of BOS without lossing balance. Needs reinforcement on DME and safe transfers     Follow Up Recommendations  SNF       Equipment Recommendations  3 in 1 bedside comode;Tub/shower bench       Frequency Min 2X/week   Plan Discharge plan remains appropriate    Precautions / Restrictions Precautions Precautions: Fall Precaution Comments: sternal fx non-displaced but no precautions specifically stated Restrictions Weight Bearing Restrictions: No   Pertinent Vitals/Pain Grimace with pain in chest; repositioned    ADL  Grooming: Performed;Wash/dry hands;Minimal assistance Where Assessed - Grooming: Supported Copywriter, advertising: Moderate assistance;Performed Toilet Transfer Method: Sit to Barista: Regular height toilet;Grab bars Toileting - Clothing Manipulation and Hygiene: Simulated;+1 Total assistance Where Assessed - Engineer, mining and Hygiene: Standing Equipment Used: Gait belt;Rolling walker Transfers/Ambulation Related to ADLs: Pt needs mod A for sit to stand, cueing for hand placement, and staying safely in RW. ADL Comments: Pt was able to follow commands.  Needed set up with providing soap and papertowels.       OT Goals ADL Goals ADL Goal: Grooming - Progress: Progressing toward goals ADL Goal: Toilet Transfer - Progress: Progressing toward goals Miscellaneous OT Goals OT Goal: Miscellaneous Goal #1 - Progress: Progressing toward goals  Visit Information  Last OT Received On: 12/11/12 Assistance Needed: +1    Subjective Data  Subjective: My chest  hurts but I'm trying      Cognition  Cognition Overall Cognitive Status: Impaired Area of Impairment: Memory;Safety/judgement Arousal/Alertness: Awake/alert Orientation Level: Appears intact for tasks assessed Behavior During Session: [facility] for tasks performed Memory Deficits: Unable to remember what OTA/S stated we were doing next Safety/Judgement - Other Comments: Decreased awareness of how to safely ambulate and use DME    Mobility  Bed Mobility Details for Bed Mobility Assistance: Pt in chair upon arrival Transfers Transfers: Sit to Stand;Stand to Sit Sit to Stand: 4: Min assist;From chair/3-in-1;With armrests;With upper extremity assist Stand to Sit: 4: Min assist;With upper extremity assist;With armrests;To chair/3-in-1 Details for Transfer Assistance: mod cues for safety with DME and sit <>stand       Balance Balance Balance Assessed: Yes Static Standing Balance Static Standing - Balance Support: During functional activity;Bilateral upper extremity supported Static Standing - Level of Assistance: 4: Min assist Static Standing - Comment/# of Minutes: Stood 1 mintue at sink with min A   End of Session OT - End of Session Equipment Utilized During Treatment: Gait belt Activity Tolerance: Patient limited by pain Patient left: with call bell/phone within reach;in chair       Mayford Knife, [place] 12/11/2012, 3:44 PM

## 2012-12-11 NOTE — Discharge Summary (Addendum)
Physician Discharge Summary  Joel Walker:[id number] DOB: [birth year] DOA: 12/06/2012  PCP: Kimber Relic, MD  Admit date: 12/06/2012 Discharge date: 12/11/2012  Time spent: >30 minutes  Recommendations for Outpatient Follow-up:      Follow-up Information   Follow up with [facility], MD. (in 1-2weeks, call for appt)    Contact information:   [address] [place] [address] [phone]       Please follow up. (SNF MD in 1-2days)     Patient is to have Bmet in 2days(4/9), followup of renal function and nursing home M.D. to continue close monitoring of renal function the  Discharge Diagnoses:  Active Problems:   Acute on chronic congestive heart failure   Syncope   Fall   Cellulitis of right lower extremity   Lung nodule   Confusion with non-focal neuro exam   Diabetes mellitus   Paroxysmal atrial fibrillation   Discharge Condition: Improved/Stable  Diet recommendation: Modified carbohydrate  Filed Weights   12/09/12 0517 12/10/12 0700 12/11/12 0423  Weight: 86.9 kg (191 lb 9.3 oz) 89.268 kg (196 lb 12.8 oz) 89.7 kg (197 lb 12 oz)    History of present illness:  is a 79 y.o. male CAD, chronic systolic CHF, ischemic CM, LV apical thrombus, DVT, PAF who lives alone. He is cared for by his significant other's niece, Elita Quick, who lives next door. He has not been on coumadin or aspirin since May 2012 due to issues with compliance and INR testing.  He was found down this morning by Pam who comes in each morning to give him breakfast and his medications. He was confused and remains so in the ED. He was found lying in urine and stool. He does not remember falling. He remembers crawling to the kitchen. Per his sister he also had a fall last week but did not hurt himself. He refuses to use his walker or wheelchair at home. Per his sister his legs are more swollen than usual and the right LE is bright pink. The patient does not complain of SOB, CP,  Dizziness, changes in bowel habits, or dysuria. He reports he drinks 2-3 beers a day, and quit using tobacco many years ago. Hospital Course:  Acute on Chronic CHF  Upon admission patient was started on diuresis with IV Lasix , cardiac enzymes were cycled and came back negative. Daily weights were also closely monitored and he was, low sodium diet. Cardiology was consulted and they followed patient and adjusted his medications, the lisinopril was discontinued because of his renal function and he was placed on hydralazine and nitrates instead. He was maintained on Coreg which is to continue upon discharge  -echo on 4/2 with EF of 30%, CVA or hypokinesis of the posterior wall, hypokinesis of the inferior wall and akinesis of the mid to apical inferior septal and anteroseptal walls and akinesis of the true apex (Last echo in 2011 showed EF of 30 - 35%) -He improved clinically, His renal function was monitored closely with diuresis and on followup on 4/6 his creatinine was up to 2.2, and his Lasix was held-and on recheck today is still 2.2 and cardiology recommended to hold Lasix again today and resume it tomorrow at a decreased dose of 20 mg daily and followup outpatient Syncope  -Upon admission cardiac enzymes were cycled and came back negative. CT scan of head was done and showed no acute findings. And MRI it was done and R. was negative for acute intracranial findings.  Cardiology was consulted and followed patient and initially stated consider a 30 day outpatient monitor-but on followup today Dr.McAlhany status patient does not need outpatient monitor. Care complaint of dizziness while in the hospital and his amiodarone dose was decreased to 100 mg (from 200 mg) as it was noted that it could cause dizziness. The patient had an EEG done which did not show any epileptiform activity Fall/intermittent vertigo  -Pt also reports ?vertigo, MRI was done as above and came back neg. his amiodarone dose was decreased  to 100 mg as above the -TSH wnl  -PT / OT evaluation was done and skilled  Right lower extremity  -Clinically improved-patient was treated with doxycycline which she is to continue upon discharge to complete a ten-day course. He remained afebrile with no leukocytosis    Procedures:  Lower extremity Doppler ultrasound-negative for DVT  .2-D echocardiogram -Impressions:  - Technically difficult study. Definity contrast would have helped. EF 30% with wall motion abnormalities as described above. Normal RV size and systolic function. No significant valvular abnormalities. Transthoracic echocardiography.  EEG-no epileptiform activity reported  Consultations:  Cardiology -Bruce  Discharge Exam: Filed Vitals:   12/11/12 0423 12/11/12 0455 12/11/12 1016 12/11/12 1400  BP: 175/72 152/62 112/50 111/50  Pulse: 72 63  79  Temp: 97.2 F (36.2 C)   97.7 F (36.5 C)  TempSrc: Oral   Oral  Resp: 20   20  Height:      Weight: 89.7 kg (197 lb 12 oz)     SpO2: 100%   97%   Exam:  General: Pt is alert and orientedX2  Cardiovascular: RRR, nl S1S2  Respiratory: decreased BS at bases,o/w clear  Abdomen: soft +BS NT/ND  Extremities: Right lower extremity with a decreased erythema and trace to+1 edema, wound on lower shin area with dressing clean and dry. left lower extremity with no cyanosis and no edema   Discharge Instructions  Discharge Orders   Future Appointments Provider Department Dept Phone   02/28/2013 12:45 PM Kimber Relic, MD [facility] [phone]   Future Orders Complete By Expires     Diet Carb Modified  As directed     Increase activity slowly  As directed         Medication List    STOP taking these medications       lisinopril 5 MG tablet  Commonly known as:  PRINIVIL,ZESTRIL     metFORMIN 750 MG 24 hr tablet  Commonly known as:  GLUCOPHAGE-XR     potassium chloride SA 20 MEQ tablet  Commonly known as:  K-DUR,KLOR-CON      TAKE these  medications       amiodarone 200 MG tablet  Commonly known as:  PACERONE  Take 0.5 tablets (100 mg total) by mouth daily.     aspirin EC 81 MG tablet  Take 81 mg by mouth daily.     carvedilol 6.25 MG tablet  Commonly known as:  COREG  Take 6.25 mg by mouth daily.     doxycycline 100 MG tablet  Commonly known as:  VIBRA-TABS  Take 1 tablet (100 mg total) by mouth every 12 (twelve) hours.     furosemide 20 MG tablet  Commonly known as:  LASIX  Take 1 tablet (20 mg total) by mouth daily.     hydrALAZINE 25 MG tablet  Commonly known as:  APRESOLINE  Take 0.5 tablets (12.5 mg total) by mouth 3 (three) times daily.     insulin  aspart 100 UNIT/ML injection  Commonly known as:  novoLOG  Inject 3 Units into the skin 3 (three) times daily with meals.     insulin aspart 100 UNIT/ML injection  Commonly known as:  novoLOG  Inject 0-9 Units into the skin 3 (three) times daily with meals.     isosorbide mononitrate 30 MG 24 hr tablet  Commonly known as:  IMDUR  Take 1 tablet (30 mg total) by mouth daily.     levothyroxine 25 MCG tablet  Commonly known as:  SYNTHROID, LEVOTHROID  Take 25 mcg by mouth daily before breakfast.     nitroGLYCERIN 0.4 MG SL tablet  Commonly known as:  NITROSTAT  Place 0.4 mg under the tongue every 5 (five) minutes as needed.           Follow-up Information   Follow up with MCALHANY,CHRISTOPHER, MD. (in 1-2weeks, call for appt)    Contact information:   [address] [place] [address] [phone]       Please follow up. (SNF MD in 1-2days)        The results of significant diagnostics from this hospitalization (including imaging, microbiology, ancillary and laboratory) are listed below for reference.    Significant Diagnostic Studies: Dg Chest 2 View  12/06/2012  *RADIOLOGY REPORT*  Clinical Data: Mid chest pain  CHEST - 2 VIEW  Comparison: 05/13/2009  Findings: Low volume film with asymmetric elevation of the right  hemidiaphragm.  The Cardiopericardial silhouette is at upper limits of normal for size. Interstitial markings are diffusely coarsened with chronic features.  No pleural effusion.  Biapical pleural thickening again noted with new area of nodularity in the left apex.  Imaged bony structures of the thorax are intact.  IMPRESSION: New nodularity in the subpleural left apex.  CT chest without contrast recommended to further evaluate.  Underlying chronic interstitial changes as before.   Original Report Authenticated By: Kennith Center, M.D.    Ct Head Wo Contrast  12/06/2012  *RADIOLOGY REPORT*  Clinical Data: Larey Seat.  Hit head.  CT HEAD WITHOUT CONTRAST  Technique:  Contiguous axial images were obtained from the base of the skull through the vertex without contrast.  Comparison: 08/15/2012.  Findings: Stable age related cerebral atrophy, ventriculomegaly and periventricular white matter disease.  No extra-axial fluid collections are identified.  No CT findings for acute hemispheric infarction and/or intracranial hemorrhage.  No mass lesions.  The brainstem and cerebellum grossly normal and stable.  Vascular calcifications are noted.  Globes are intact.  The bony structures are intact.  No acute skull fracture.  The paranasal sinuses and mastoid air cells are grossly clear.  IMPRESSION:  1.  Age related cerebral atrophy, ventriculomegaly and periventricular white matter disease. 2.  No acute intracranial findings or skull fracture.   Original Report Authenticated By: Rudie Meyer, M.D.    Ct Chest Wo Contrast  12/06/2012  *RADIOLOGY REPORT*  Clinical Data: Larey Seat.  Chest pain.  CT CHEST WITHOUT CONTRAST  Technique:  Multidetector CT imaging of the chest was performed following the standard protocol without IV contrast.  Comparison: None.  Findings: Examination of the chest wall demonstrates a upper sternal fracture without displacement.  There is an associated pre sternal hematoma.  No retrosternal hematoma.  No definite  acute rib fractures.  There are remote healed rib fractures on the left.  No supraclavicular or axillary mass or adenopathy.  The heart is normal in size.  No pericardial effusion.  Advanced atherosclerotic calcifications involving  the coronary arteries. The aorta is normal in caliber.  Scattered atherosclerotic calcifications.  No mediastinal or hilar adenopathy.  Small scattered lymph nodes are noted.  The esophagus is grossly normal.  Examination of the lung parenchyma demonstrates changes of peripheral interstitial lung disease, likely UIP.  No pulmonary contusion, pneumothorax, atelectasis, pleural effusion or worrisome lung lesion.  The tracheobronchial tree is unremarkable  The upper abdomen demonstrates calcified granulomas in the liver and spleen.  Gallbladder surgically absent.  Aortic calcifications without aneurysm.  IMPRESSION:  1.  Non depressed upper sternal fracture without substernal or mediastinal hematoma. 2.  Remote healed left rib fractures.  No definite acute rib fractures. 3.  Interstitial lung disease but no acute pulmonary findings. 4.  Advanced atherosclerotic calcifications involving the coronary arteries.   Original Report Authenticated By: Rudie Meyer, M.D.    Mr Brain Wo Contrast  12/08/2012  *RADIOLOGY REPORT*  Clinical Data: Vertigo.  Evaluate for posterior circulation TIA.  MRI HEAD WITHOUT CONTRAST  Technique:  Multiplanar, multiecho pulse sequences of the brain and surrounding structures were obtained according to standard protocol without intravenous contrast.  Comparison: CT head 12/06/12  Findings: There is no evidence for acute infarction, intracranial hemorrhage, mass lesion, hydrocephalus, or extra-axial fluid. Advanced atrophy.  Fairly extensive chronic microvascular ischemic change.  No midline shift.  Major intracranial vascular structures patent.  No osseous destructive lesion.  Scattered lacunes.Normal pituitary and cerebellar tonsils.  Mild pannus surrounds the  odontoid.  Negative orbits.  No acute sinus or mastoid fluid.  IMPRESSION: No acute posterior fossa ischemic change is identified.  Advanced atrophy with chronic microvascular ischemic change.   Original Report Authenticated By: Davonna Belling, M.D.     Microbiology: No results found for this or any previous visit (from the past 240 hour(s)).   Labs: Basic Metabolic Panel:  Recent Labs Lab 12/07/12 0625 12/08/12 0653 12/09/12 0952 12/10/12 0425 12/11/12 0530  NA 135 134* 131* 132* 134*  K 3.6 3.9 3.6 3.8 3.5  CL 100 98 97 96 97  CO2 25 26 24 26 24   GLUCOSE 105* 120* 186* 105* 110*  BUN 24* 26* 28* 33* 41*  CREATININE 1.98* 2.03* 1.94* 2.21* 2.22*  CALCIUM 8.5 8.8 8.6 8.9 8.8   Liver Function Tests:  Recent Labs Lab 12/06/12 1032 12/06/12 1655 12/07/12 0625  AST 35 33 29  ALT 22 21 20   ALKPHOS 80 72 67  BILITOT 1.5* 1.6* 1.5*  PROT 6.5 6.0 5.6*  ALBUMIN 3.2* 2.9* 2.7*   No results found for this basename: LIPASE, AMYLASE,  in the last 168 hours  Recent Labs Lab 12/06/12 1701  AMMONIA 20   CBC:  Recent Labs Lab 12/06/12 1032 12/06/12 1655 12/07/12 0625  WBC 10.1 9.6 7.4  NEUTROABS 7.5  --   --   HGB 11.0* 11.2* 10.3*  HCT 33.1* 32.5* 30.3*  MCV 91.4 91.8 91.3  PLT 166 155 144*   Cardiac Enzymes:  Recent Labs Lab 12/06/12 1051 12/07/12 0915  CKTOTAL 275*  --   CKMB 6.1*  --   TROPONINI  --  <0.30   BNP: BNP (last 3 results)  Recent Labs  12/06/12 1051 12/08/12 0653  PROBNP 5232.0* 8353.0*   CBG:  Recent Labs Lab 12/10/12 1154 12/10/12 1554 12/10/12 2118 12/11/12 0608 12/11/12 1107  GLUCAP 153* 186* 136* 132* 180*       Signed:  Carmin Dibartolo C  [facility] 12/11/2012, 2:33 PM

## 2012-12-11 NOTE — Progress Notes (Signed)
I have reviewed and agree with this note. Joel Walker, [name] [phone] 12/11/2012

## 2012-12-13 ENCOUNTER — Ambulatory Visit: Payer: Self-pay | Admitting: Internal Medicine

## 2012-12-14 ENCOUNTER — Non-Acute Institutional Stay (SKILLED_NURSING_FACILITY): Payer: Medicare Other | Admitting: Nurse Practitioner

## 2012-12-14 ENCOUNTER — Encounter: Payer: Self-pay | Admitting: Nurse Practitioner

## 2012-12-14 DIAGNOSIS — L03115 Cellulitis of right lower limb: Secondary | ICD-10-CM

## 2012-12-14 DIAGNOSIS — I10 Essential (primary) hypertension: Secondary | ICD-10-CM

## 2012-12-14 DIAGNOSIS — R55 Syncope and collapse: Secondary | ICD-10-CM

## 2012-12-14 DIAGNOSIS — L02419 Cutaneous abscess of limb, unspecified: Secondary | ICD-10-CM

## 2012-12-14 DIAGNOSIS — I509 Heart failure, unspecified: Secondary | ICD-10-CM

## 2012-12-14 DIAGNOSIS — E119 Type 2 diabetes mellitus without complications: Secondary | ICD-10-CM

## 2012-12-14 NOTE — Assessment & Plan Note (Signed)
No additional episodes.

## 2012-12-14 NOTE — Progress Notes (Signed)
Patient ID: Joel Walker, male   DOB: [birth year], 79 y.o.   MRN: [id number]  Chief Complaint: follow up hospitalization   HPI:  Pt is a 79 y.o. male CAD, chronic systolic CHF, ischemic CM, LV apical thrombus, DVT, PAF who has lived alone. He is cared for by his significant other's niece, Elita Quick, who lives next door. He has not been on coumadin or aspirin since May 2012 due to issues with compliance and INR testing.  He was found down in urine and stool by Pam who comes in each morning to give him breakfast and his medications. He was confused so he was taken to the ED.He does not remember falling. He remembers crawling to the kitchen. Per his sister he also had a fall last week but did not hurt himself. He refuses to use his walker or wheelchair at home. Per his sister his legs are more swollen than usual and the right LE is bright pink.  Upon admission patient was started on diuresis with IV Lasix , cardiac enzymes were cycled and came back negative. Daily weights were also closely monitored and he was, low sodium diet. Cardiology was consulted and they followed patient and adjusted his medications, the lisinopril was discontinued because of his renal function and he was placed on hydralazine and nitrates instead. He was maintained on Coreg which is to continue upon discharge   -echo on 4/2 with EF of 30%, CVA or hypokinesis of the posterior wall, hypokinesis of the inferior wall and akinesis of the mid to apical inferior septal and anteroseptal walls and akinesis of the true apex (Last echo in 2011 showed EF of 30 - 35%)  -He improved clinically, His renal function was monitored closely with diuresis and on followup on 4/6 his creatinine was up to 2.2, and his Lasix was held-and on recheck today is still 2.2 and cardiology recommended to hold Lasix again today and resume it tomorrow at a decreased dose of 20 mg daily and followup outpatient   -Upon admission cardiac enzymes were cycled and came back  negative. CT scan of head was done due to pt falling which showed no acute findings. And MRI it was done and was negative for acute intracranial findings. Cardiology was consulted and followed patient and initially stated consider a 30 day outpatient monitor-but on followup today Dr.McAlhany status patient does not need outpatient monitor. Care complaint of dizziness while in the hospital and his amiodarone dose was decreased to 100 mg (from 200 mg) as it was noted that it could cause dizziness. The patient had an EEG done which did not show any epileptiform activity.TSH wnl... PT / OT evaluation was done and SNF was recommened and he is now at [facility] for Rehab. Pts Right lower extremity cellulitis has improved-patient was treated with doxycycline which she is to continue upon discharge to complete a ten-day course. He remained afebrile with no leukocytosis. Pt currently without complaints.    Review of Systems:  Review of Systems  Constitutional: Negative for fever, chills and malaise/fatigue.  HENT: Negative for nosebleeds and congestion.   Eyes: Negative for blurred vision and double vision.  Respiratory: Negative for cough, sputum production and shortness of breath.   Cardiovascular: Negative for chest pain, palpitations and orthopnea.  Gastrointestinal: Negative for heartburn, nausea, vomiting, diarrhea and constipation.  Genitourinary: Negative for dysuria and frequency.       Incont of urine  Musculoskeletal: Negative for myalgias, back pain and joint pain. Falls: reports no falls since  hospitalization.  Skin:       Lower extremity cellulitis improved   Neurological: Negative for dizziness and headaches.  Psychiatric/Behavioral: Positive for memory loss. Negative for depression. The patient is not nervous/anxious and does not have insomnia.      Medications: Patient's Medications  New Prescriptions   No medications on file  Previous Medications   AMIODARONE (PACERONE) 200 MG  TABLET    Take 0.5 tablets (100 mg total) by mouth daily.   ASPIRIN EC 81 MG TABLET    Take 81 mg by mouth daily.   CARVEDILOL (COREG) 6.25 MG TABLET    Take 6.25 mg by mouth daily.   DOXYCYCLINE (VIBRA-TABS) 100 MG TABLET    Take 1 tablet (100 mg total) by mouth every 12 (twelve) hours.   FUROSEMIDE (LASIX) 20 MG TABLET    Take 1 tablet (20 mg total) by mouth daily.   HYDRALAZINE (APRESOLINE) 25 MG TABLET    Take 0.5 tablets (12.5 mg total) by mouth 3 (three) times daily.   INSULIN ASPART (NOVOLOG) 100 UNIT/ML INJECTION    Inject 3 Units into the skin 3 (three) times daily with meals.   INSULIN ASPART (NOVOLOG) 100 UNIT/ML INJECTION    Inject 0-9 Units into the skin 3 (three) times daily with meals.   ISOSORBIDE MONONITRATE (IMDUR) 30 MG 24 HR TABLET    Take 1 tablet (30 mg total) by mouth daily.   LEVOTHYROXINE (SYNTHROID, LEVOTHROID) 25 MCG TABLET    Take 25 mcg by mouth daily before breakfast.   NITROGLYCERIN (NITROSTAT) 0.4 MG SL TABLET    Place 0.4 mg under the tongue every 5 (five) minutes as needed.  Modified Medications   No medications on file  Discontinued Medications   No medications on file     Physical Exam: Physical Exam  Nursing note and vitals reviewed. Constitutional: He appears well-developed and well-nourished. No distress.  HENT:  Head: Normocephalic and atraumatic.  Eyes: EOM are normal. Pupils are equal, round, and reactive to light.  Neck: Normal range of motion. Neck supple.  Cardiovascular: Normal rate, regular rhythm and normal heart sounds.   Pulmonary/Chest: Effort normal and breath sounds normal.  Abdominal: Soft. Bowel sounds are normal.  Musculoskeletal: Normal range of motion. He exhibits edema. He exhibits no tenderness.  Neurological: He is alert.  Skin: Skin is warm and dry. He is not diaphoretic.  Psychiatric: He has a normal mood and affect.    Filed Vitals:   12/14/12 1549  BP: 142/61  Pulse: 69  Temp: 97.8 F (36.6 C)  Resp: 20   Weight: 201 lb (91.173 kg)      Labs reviewed: Basic Metabolic Panel:  Recent Labs  16/10/96 0952 12/10/12 0425 12/11/12 0530  NA 131* 132* 134*  K 3.6 3.8 3.5  CL 97 96 97  CO2 24 26 24   GLUCOSE 186* 105* 110*  BUN 28* 33* 41*  CREATININE 1.94* 2.21* 2.22*  CALCIUM 8.6 8.9 8.8    Liver Function Tests:  Recent Labs  12/06/12 1032 12/06/12 1655 12/07/12 0625  AST 35 33 29  ALT 22 21 20   ALKPHOS 80 72 67  BILITOT 1.5* 1.6* 1.5*  PROT 6.5 6.0 5.6*  ALBUMIN 3.2* 2.9* 2.7*    CBC:  Recent Labs  12/06/12 1032 12/06/12 1655 12/07/12 0625  WBC 10.1 9.6 7.4  NEUTROABS 7.5  --   --   HGB 11.0* 11.2* 10.3*  HCT 33.1* 32.5* 30.3*  MCV 91.4 91.8 91.3  PLT 166  155 144*    Procedures:  Lower extremity Doppler ultrasound-negative for DVT 2-D echocardiogram   Impressions: Technically difficult study. Definity contrast would have helped. EF 30% with wall motion abnormalities as described above. Normal RV size and systolic function. No significant valvular abnormalities. EEG-no epileptiform activity reported Consultations:  Cardiology -[place]   Assessment/Plan HYPERTENSION Patient is stable; continue current regimen. Will monitor and make changes as necessary.   Acute on chronic congestive heart failure Improved. No signs of fluid overload. Will follow up bmp next week  Syncope No additional episodes   DM Cont novolog   RENAL DISEASE, CHRONIC, STAGE III Will follow up Cr   Cellulitis of right lower extremity Improved. Cont doxycycline until 12/15/12    Goals of care: to cont PT/OT   Labs/tests ordered : BMP on Monday 12/18/12

## 2012-12-14 NOTE — Assessment & Plan Note (Addendum)
Improved. No signs of fluid overload. Will follow up bmp next week

## 2012-12-14 NOTE — Assessment & Plan Note (Signed)
Improved. Cont doxycycline until 12/15/12

## 2012-12-14 NOTE — Assessment & Plan Note (Signed)
Will follow up Cr

## 2012-12-14 NOTE — Assessment & Plan Note (Signed)
Patient is stable; continue current regimen. Will monitor and make changes as necessary.  

## 2012-12-14 NOTE — Assessment & Plan Note (Signed)
Cont novolog

## 2012-12-20 ENCOUNTER — Encounter: Payer: Self-pay | Admitting: Internal Medicine

## 2012-12-20 ENCOUNTER — Non-Acute Institutional Stay (SKILLED_NURSING_FACILITY): Payer: Medicare Other | Admitting: Internal Medicine

## 2012-12-20 DIAGNOSIS — I2589 Other forms of chronic ischemic heart disease: Secondary | ICD-10-CM

## 2012-12-20 DIAGNOSIS — Z7901 Long term (current) use of anticoagulants: Secondary | ICD-10-CM

## 2012-12-20 DIAGNOSIS — I5021 Acute systolic (congestive) heart failure: Secondary | ICD-10-CM

## 2012-12-20 DIAGNOSIS — I4891 Unspecified atrial fibrillation: Secondary | ICD-10-CM

## 2012-12-20 DIAGNOSIS — I480 Paroxysmal atrial fibrillation: Secondary | ICD-10-CM

## 2012-12-20 NOTE — Progress Notes (Signed)
Patient ID: Joel Walker, male   DOB: 1929-12-29, 79 y.o.   MRN: [id number]   RAIN FRIEDT WUJ:[id number] DOB: [birth year] DOA: (Not on file)   PCP: Kimber Relic, MD   Chief Complaint: post hospitalization follow up  Patient seen and examined on 4/16  HPI:  [name] is an 79 y/o male with hx of chronic systolic heart failure, DM, CAD,Hypothyroidism,  hx of DVT, paroxysmal Afib( not on coumadin due to issue with compliance for INR monitoring)  and LV apical thrombus who was admitted to [facility] from 4/2 to 4/7 for possible syncope at home with acute confusion. In the ED he was found to be in acute CHF and started diuresis with IV lasix. [facility] closely followed the patient and given his worsening renal function patient was taken off lisinopril and started on hydralazine and Imdur. His renal function further worsened on IV lasix with creatinine as high as 2.2 and was held for 2 days and restarted at a lower dose. Regarding his possible syncopal episode , his admission EKG was unremarkable with negative serial cardiac enzymes. CT head and MRI brain were negative. As patient complained of dizziness , his amiodarone dose was also reduced. An EEG was done which was unremarkable as well. He also had erythema of right lower extremity with concern for cellulitis and treated with a 10 day course of doxycycline. Patient was seen by PT/OT ad recommended for SNF for active PT as he was unsafe to be living alone for now. On evaluation today patient denies any headache, dizziness, fever, chills, nausea, vomiting, chest pain, shortness of breath at rest, orthopnea, PND, abdominal pain, bowel or urinary symptoms. He does have dyspnea on exertion and leg swellings. He has been participating with physical therapy.  Review of Systems:  Constitutional: Denies fever, chills, diaphoresis, appetite change and fatigue.  HEENT: Denies photophobia, eye pain, redness, hearing loss, ear  pain, congestion, sore throat, rhinorrhea, sneezing, mouth sores, trouble swallowing, neck pain, neck stiffness and tinnitus.   Respiratory: dyspnea on exertion present, Denies SOB at rest , cough, chest tightness,  and wheezing.   Cardiovascular: Denies chest pain, palpitations , has bilateral leg swelling.  Gastrointestinal: Denies nausea, vomiting, abdominal pain, diarrhea, constipation, blood in stool and abdominal distention.  Genitourinary: Denies dysuria, urgency, frequency, hematuria, flank pain and difficulty urinating.  Musculoskeletal: Denies myalgias, back pain, joint swelling, arthralgias and gait problem.  Skin: Denies pallor, rash and wound.  Neurological: Denies dizziness, seizures, syncope, weakness, light-headedness, numbness and headaches.  Hematological: Denies adenopathy. Easy bruising, personal or family bleeding history  Psychiatric/Behavioral: Denies suicidal ideation, mood changes, confusion, nervousness, sleep disturbance and agitation   Past Medical History  Diagnosis Date  . SYSTOLIC HEART FAILURE, ACUTE   . PULMONARY HYPERTENSION   . PAROXYSMAL ATRIAL FIBRILLATION   . HYPERTENSION   . DVT   . CARDIOMYOPATHY   . CAD, NATIVE VESSEL   . TOBACCO USE, QUIT   . RENAL DISEASE, CHRONIC, STAGE III   . MURAL THROMBUS, APEX OF HEART   . Long term current use of anticoagulant   . HYPOKALEMIA   . DM   . ANEMIA, IRON DEFICIENCY   . CHF (congestive heart failure)    Past Surgical History  Procedure Laterality Date  . Cholecystectomy    . Placement of a veriflex bare-metal sten    . Coronary angioplasty with stent placement      VeriFLEX bare-metal stent   Social History:  reports that  he quit smoking about 14 years ago. His smoking use included Cigarettes. He has a 50 pack-year smoking history. He has never used smokeless tobacco. He reports that he drinks about 8.4 ounces of alcohol per week. He reports that he does not use illicit drugs.  Allergies  Allergen  Reactions  . Lisinopril     REACTION: Cough. Unsure if this is a true allergy. Pt takes this med everyday with out any known side effects per family.    History reviewed. No pertinent family history.  Prior to Admission medications   Medication Sig Start Date End Date Taking? Authorizing Provider  amiodarone (PACERONE) 200 MG tablet Take 0.5 tablets (100 mg total) by mouth daily. 12/11/12   Kela Millin, MD  aspirin EC 81 MG tablet Take 81 mg by mouth daily.    Historical Provider, MD  carvedilol (COREG) 6.25 MG tablet Take 6.25 mg by mouth daily.    Historical Provider, MD  doxycycline (VIBRA-TABS) 100 MG tablet Take 1 tablet (100 mg total) by mouth every 12 (twelve) hours. 12/11/12   Kela Millin, MD  furosemide (LASIX) 20 MG tablet Take 1 tablet (20 mg total) by mouth daily. 12/11/12   Kela Millin, MD  hydrALAZINE (APRESOLINE) 25 MG tablet Take 0.5 tablets (12.5 mg total) by mouth 3 (three) times daily. 12/11/12   Kela Millin, MD  insulin aspart (NOVOLOG) 100 UNIT/ML injection Inject 3 Units into the skin 3 (three) times daily with meals. 12/11/12   Kela Millin, MD  insulin aspart (NOVOLOG) 100 UNIT/ML injection Inject 0-9 Units into the skin 3 (three) times daily with meals. 12/11/12   Kela Millin, MD  isosorbide mononitrate (IMDUR) 30 MG 24 hr tablet Take 1 tablet (30 mg total) by mouth daily. 12/11/12   Kela Millin, MD  levothyroxine (SYNTHROID, LEVOTHROID) 25 MCG tablet Take 25 mcg by mouth daily before breakfast.    Historical Provider, MD  nitroGLYCERIN (NITROSTAT) 0.4 MG SL tablet Place 0.4 mg under the tongue every 5 (five) minutes as needed.    Historical Provider, MD    Physical Exam:  Filed Vitals:   12/20/12 1100  BP: 116/62  Pulse: 56  Temp: 97 F (36.1 C)  Resp: 20  SpO2: 93%    Constitutional: Vital signs reviewed.  Patient is a well-developed and well-nourished in no acute distress and cooperative with exam.  Head: Normocephalic and  atraumatic Ear: TM normal bilaterally Mouth: no erythema or exudates, MMM Eyes: PERRL, EOMI, conjunctivae normal, No scleral icterus.  Neck: Supple, Trachea midline normal ROM, No JVD, mass, thyromegaly, or carotid bruit present.  Cardiovascular: RRR, S1 normal, S2 normal, no MRG, pulses symmetric and intact bilaterally Pulmonary/Chest: bilateral basilar crackles, no wheezes, rales, or rhonchi Abdominal: Soft. Non-tender, non-distended, bowel sounds are normal, no masses, organomegaly, or guarding present.  GU: no CVA tenderness Musculoskeletal: No joint deformities, erythema, or stiffness, ROM full and no nontender Ext: 2 + pitting  edema bilaterally,  no cyanosis, pulses palpable bilaterally  Hematology: no cervical, inginal, or axillary adenopathy.  Neurological: A&O x2, Strenght is normal and symmetric bilaterally, cranial nerve II-XII are grossly intact, no focal motor deficit, sensory intact .  Skin: Warm, dry and intact. No rash, cyanosis, or clubbing.  Psychiatric: Normal mood and affect. speech and behavior is normal. Mildly impaired memory.  Labs  Hospital labs reviewed Creatinine 2.26 on 4/15   Assessment/Plan  Systolic CHF  2D echo done in the hospital showed EF of 30%  with posterior and inferior wall hypokinesis. His previous echo from 2011 also showed EF of 35%. Patient was diuresed with IV lasix however given worsening renal function , dose was held for 2 days and restarted at 20 mg daily. On exam today he has pitting edema upto upper tibia bilaterally. His creatinine checked on 4/15 was 2.26. i will increase his lasix dose to 20 mg bid and recheck renal function in 2-3 days.  Monitor daily weight. Continue hydralazine and imdur ( substituted for ACEi given worsening  renal function). continue coreg.  Syncope with vertigo and unsteady gait Full w/up including CT head, MRI brain and EEG done in the hospital was negative. TSH was wnl. Amiodarone dose was reduced. patient  participating well with PT here and deneis further symptoms.  CAD Continue coreg and aspirin  Diabetes mellitus  last A1C of 6 Continue premeal aspart and sliding scale insulin  Hypothyroidism TSH normal. continue current dose of synthroid  Paroxysmal afib  not on anticoagulation due to compliance issues. continue amiodarone . Dose reduced recently due to dizziness.  Activities of daily living Feeds independently, dresses independently, needs assistance with toileting, bathing and ambulating.   patient counseled on dietary and medication compliance and encouraged to participate with PT.  Future labs: Renal function will be checked in 2-3 days   Follow up:  patient will be followed up as needed.  Code Status: full code Family Communication: none  Total time spent: 60 minutes   Thelton Graca  12/20/2012, 6:17 PM

## 2012-12-25 ENCOUNTER — Other Ambulatory Visit: Payer: Self-pay | Admitting: Cardiovascular Disease

## 2013-01-10 ENCOUNTER — Encounter: Payer: Self-pay | Admitting: Nurse Practitioner

## 2013-01-10 ENCOUNTER — Non-Acute Institutional Stay (SKILLED_NURSING_FACILITY): Payer: Medicare Other | Admitting: Nurse Practitioner

## 2013-01-10 DIAGNOSIS — L03115 Cellulitis of right lower limb: Secondary | ICD-10-CM

## 2013-01-10 DIAGNOSIS — E119 Type 2 diabetes mellitus without complications: Secondary | ICD-10-CM

## 2013-01-10 DIAGNOSIS — L03119 Cellulitis of unspecified part of limb: Secondary | ICD-10-CM

## 2013-01-10 NOTE — Assessment & Plan Note (Addendum)
Has worsened- now with open areas on right lateral leg- will start IV zosyn 2.25mg  q 6 hours  for 7 days since he has previously been on doxycyline after hospitalization.will also have wound care nurse follow and treat ulcerations on lower leg

## 2013-01-10 NOTE — Assessment & Plan Note (Signed)
Will follow up bmp 

## 2013-01-10 NOTE — Assessment & Plan Note (Signed)
Previously on metformin prior to hospitalization but this was stopped due to renal disease- will need insulin as outpatient- pts getting 3 units of novolog with meals daily- will stop this and start him on 5 units lantus q hs. Will still monitor blood sugars achs at this time.

## 2013-01-10 NOTE — Progress Notes (Signed)
Patient ID: Joel Walker, male   DOB: 1930/07/19, 79 y.o.   MRN: [id number]  Chief Complaint: evaluation for discharge   HPI:  Pt is a 79 y.o. male CAD, chronic systolic CHF, ischemic CM, LV apical thrombus, DVT, PAF  And was recently hospitalized with LE cellulitis and edema. He was treated with doxycycline and lasix- this had orginally improved but has now gotten worse and lower extremities are edemaous and red with open sores on the lateral aspect of his right lower leg. Pt denies feeling febrile. T max was 99.0 today.  He denies shortness of breath, cough, chest pain     Review of Systems:  Review of Systems  Constitutional: Negative for fever and chills.       Weight gain  Respiratory: Negative for shortness of breath.   Cardiovascular: Positive for leg swelling. Negative for chest pain and palpitations.  Musculoskeletal: Negative for myalgias and back pain.  Skin:       Redness and sore to lower legs      Medications: Patient's Medications  New Prescriptions   No medications on file  Previous Medications   AMIODARONE (PACERONE) 200 MG TABLET    Take 0.5 tablets (100 mg total) by mouth daily.   ASPIRIN EC 81 MG TABLET    Take 81 mg by mouth daily.   CARVEDILOL (COREG) 6.25 MG TABLET    Take 6.25 mg by mouth daily.   DOXYCYCLINE (VIBRA-TABS) 100 MG TABLET    Take 1 tablet (100 mg total) by mouth every 12 (twelve) hours.   HYDRALAZINE (APRESOLINE) 25 MG TABLET    Take 0.5 tablets (12.5 mg total) by mouth 3 (three) times daily.   INSULIN ASPART (NOVOLOG) 100 UNIT/ML INJECTION    Inject 3 Units into the skin 3 (three) times daily with meals.   INSULIN ASPART (NOVOLOG) 100 UNIT/ML INJECTION    Inject 0-9 Units into the skin 3 (three) times daily with meals.   ISOSORBIDE MONONITRATE (IMDUR) 30 MG 24 HR TABLET    Take 1 tablet (30 mg total) by mouth daily.   LEVOTHYROXINE (SYNTHROID, LEVOTHROID) 25 MCG TABLET    Take 25 mcg by mouth daily before breakfast.   NITROGLYCERIN  (NITROSTAT) 0.4 MG SL TABLET    Place 0.4 mg under the tongue every 5 (five) minutes as needed.  Modified Medications   Modified Medication Previous Medication   FUROSEMIDE (LASIX) 20 MG TABLET furosemide (LASIX) 20 MG tablet      Take 20 mg by mouth 2 (two) times daily.    Take 1 tablet (20 mg total) by mouth daily.  Discontinued Medications   AMIODARONE (PACERONE) 200 MG TABLET    TAKE 1 TABLET BY MOUTH EVERY DAY     Physical Exam:  Filed Vitals:   01/10/13 1030  BP: 104/55  Pulse: 60  Temp: 99 F (37.2 C)  Resp: 20    Physical Exam  Constitutional: He appears well-developed and well-nourished. No distress.  Cardiovascular: Normal rate, regular rhythm, normal heart sounds and intact distal pulses.   Pulmonary/Chest: Effort normal and breath sounds normal.  Abdominal: Soft. Bowel sounds are normal.  Musculoskeletal: He exhibits edema and tenderness.  Neurological: He is alert.  Skin: Skin is warm and dry. He is not diaphoretic. There is erythema.  edematous 2+ to lower extremities; bilaterally red to mid calf      Labs reviewed: Basic Metabolic Panel:  Recent Labs  40/98/11 0952 12/10/12 0425 12/11/12 0530  NA 131* 132* 134*  K 3.6 3.8 3.5  CL 97 96 97  CO2 24 26 24   GLUCOSE 186* 105* 110*  BUN 28* 33* 41*  CREATININE 1.94* 2.21* 2.22*  CALCIUM 8.6 8.9 8.8    Liver Function Tests:  Recent Labs  12/06/12 1032 12/06/12 1655 12/07/12 0625  AST 35 33 29  ALT 22 21 20   ALKPHOS 80 72 67  BILITOT 1.5* 1.6* 1.5*  PROT 6.5 6.0 5.6*  ALBUMIN 3.2* 2.9* 2.7*    CBC:  Recent Labs  12/06/12 1032 12/06/12 1655 12/07/12 0625  WBC 10.1 9.6 7.4  NEUTROABS 7.5  --   --   HGB 11.0* 11.2* 10.3*  HCT 33.1* 32.5* 30.3*  MCV 91.4 91.8 91.3  PLT 166 155 144*      Assessment/Plan Diabetes mellitus Previously on metformin prior to hospitalization but this was stopped due to renal disease- will need insulin as outpatient- pts getting 3 units of novolog with  meals daily- will stop this and start him on 5 units lantus q hs. Will still monitor blood sugars achs at this time.   Bilateral cellulitis of lower leg Has worsened- now with open areas on right lateral leg- will start IV zosyn 2.25mg  q 6 hours  for 7 days since he has previously been on doxycyline after hospitalization.will also have wound care nurse follow and treat ulcerations on lower leg  RENAL DISEASE, CHRONIC, STAGE III Will follow up bmp    Will not discharge pt at this time due to needing extended treatment for worsening cellulitis

## 2013-01-12 DIAGNOSIS — IMO0002 Reserved for concepts with insufficient information to code with codable children: Secondary | ICD-10-CM

## 2013-01-12 DIAGNOSIS — L03119 Cellulitis of unspecified part of limb: Secondary | ICD-10-CM

## 2013-01-12 DIAGNOSIS — R609 Edema, unspecified: Secondary | ICD-10-CM

## 2013-01-12 DIAGNOSIS — L02419 Cutaneous abscess of limb, unspecified: Secondary | ICD-10-CM

## 2013-01-17 ENCOUNTER — Non-Acute Institutional Stay (SKILLED_NURSING_FACILITY): Payer: Medicare Other | Admitting: Nurse Practitioner

## 2013-01-17 DIAGNOSIS — I5021 Acute systolic (congestive) heart failure: Secondary | ICD-10-CM

## 2013-01-17 DIAGNOSIS — L03119 Cellulitis of unspecified part of limb: Secondary | ICD-10-CM

## 2013-01-17 DIAGNOSIS — E119 Type 2 diabetes mellitus without complications: Secondary | ICD-10-CM

## 2013-01-17 DIAGNOSIS — D509 Iron deficiency anemia, unspecified: Secondary | ICD-10-CM

## 2013-01-17 DIAGNOSIS — L02419 Cutaneous abscess of limb, unspecified: Secondary | ICD-10-CM

## 2013-01-17 DIAGNOSIS — L03115 Cellulitis of right lower limb: Secondary | ICD-10-CM

## 2013-01-17 NOTE — Assessment & Plan Note (Signed)
Currently on lantus 5 units at night- to cont this at discharge- will need home health nursing for diabetic and insulin teaching Will need to have blood sugars check twice daily

## 2013-01-17 NOTE — Assessment & Plan Note (Addendum)
Improved- pink to right leg remaining but no warmth to skin- most likely venous vasular congestion- for hh nursing to cont to monitor- pt doesn't notelevate legs due to self propelling himself in his wheelchair

## 2013-01-22 DIAGNOSIS — I509 Heart failure, unspecified: Secondary | ICD-10-CM

## 2013-01-22 DIAGNOSIS — E119 Type 2 diabetes mellitus without complications: Secondary | ICD-10-CM

## 2013-01-22 DIAGNOSIS — Z5189 Encounter for other specified aftercare: Secondary | ICD-10-CM

## 2013-01-23 ENCOUNTER — Encounter: Payer: Self-pay | Admitting: Geriatric Medicine

## 2013-01-23 ENCOUNTER — Telehealth: Payer: Self-pay | Admitting: *Deleted

## 2013-01-23 NOTE — Telephone Encounter (Signed)
Joel Walker, called and stated that they wanted to continue home health for twice a week for 5 weeks. I told him that was fine

## 2013-01-24 ENCOUNTER — Encounter: Payer: Self-pay | Admitting: Nurse Practitioner

## 2013-01-24 ENCOUNTER — Ambulatory Visit (INDEPENDENT_AMBULATORY_CARE_PROVIDER_SITE_OTHER): Payer: Medicare Other | Admitting: Nurse Practitioner

## 2013-01-24 VITALS — BP 138/70 | HR 59 | Temp 97.5°F | Resp 16 | Ht 70.0 in | Wt 189.0 lb

## 2013-01-24 DIAGNOSIS — E119 Type 2 diabetes mellitus without complications: Secondary | ICD-10-CM

## 2013-01-24 DIAGNOSIS — R5381 Other malaise: Secondary | ICD-10-CM

## 2013-01-24 DIAGNOSIS — D509 Iron deficiency anemia, unspecified: Secondary | ICD-10-CM

## 2013-01-24 DIAGNOSIS — R413 Other amnesia: Secondary | ICD-10-CM | POA: Insufficient documentation

## 2013-01-24 DIAGNOSIS — I10 Essential (primary) hypertension: Secondary | ICD-10-CM

## 2013-01-24 DIAGNOSIS — E039 Hypothyroidism, unspecified: Secondary | ICD-10-CM | POA: Insufficient documentation

## 2013-01-24 MED ORDER — DONEPEZIL HCL 5 MG PO TABS: 5.0000 mg | ORAL_TABLET | Freq: Every evening | ORAL | Status: DC | PRN

## 2013-01-24 MED ORDER — LEVOTHYROXINE SODIUM 50 MCG PO TABS: 50.0000 ug | ORAL_TABLET | Freq: Every day | ORAL | Status: DC

## 2013-01-24 NOTE — Progress Notes (Signed)
Patient ID: Joel Walker, male   DOB: [birth year], 79 y.o.   MRN: [id number]   Allergies  Allergen Reactions  . Lisinopril     REACTION: Cough. Unsure if this is a true allergy. Pt takes this med everyday with out any known side effects per family.    Chief Complaint  Patient presents with  . Hospitalization Follow-up    hospital and rehab follow up    HPI: Patient is a 79 y.o. male seen in the office today for follow up after hospitalization due to fall and cellulitis. No additional falls since hospitalization, cellulitis and wounds on legs have improved. Pt is currently at home with 24 hour supervision. Pam who is here at the office during his visit is one of his caretakers and stays with him frequently at night however she is taking on a major role of caretaker and she is unsure if she can be the only one to care for him once comforter keeper quit coming. Comforter keepers have been hired to stay with him during the day.  Sister would like him to remain at home alone after rehab is complete but pam feels this is unsafe. Doing well with therapy working with [facility].   Diabetes- metformin was stopped in hospital due to kidney function; pt is currently taking lantus at night without any difficult- blood sugars per pam in the am are 80-120; no hypoglycemic episodes  CHF/HTN- current talking hydralazine, lasix, coreg, and imdur- tolerating these medications without any noted side effects from low blood pressures. No noted chest pain or palpitations  Review of Systems:  Review of Systems  Constitutional: Negative for fever, chills and weight loss.  Respiratory: Negative for cough and shortness of breath.   Cardiovascular: Positive for leg swelling. Negative for chest pain and palpitations.  Gastrointestinal: Negative for heartburn, abdominal pain, diarrhea and constipation.  Genitourinary: Negative for dysuria, urgency and frequency.  Musculoskeletal: Positive for myalgias (in mid  right thigh) and joint pain (right hip pain ).  Skin: Negative for itching and rash.  Neurological: Positive for weakness.     Past Medical History  Diagnosis Date  . SYSTOLIC HEART FAILURE, ACUTE   . PULMONARY HYPERTENSION   . PAROXYSMAL ATRIAL FIBRILLATION   . HYPERTENSION   . DVT   . CARDIOMYOPATHY   . CAD, NATIVE VESSEL   . TOBACCO USE, QUIT   . RENAL DISEASE, CHRONIC, STAGE III   . MURAL THROMBUS, APEX OF HEART   . Long term current use of anticoagulant   . HYPOKALEMIA   . DM   . ANEMIA, IRON DEFICIENCY   . CHF (congestive heart failure)   . Senile cataract, unspecified   . Other specified disease of nail   . Unspecified hearing loss   . Chronic kidney disease, stage II (mild)   . Memory loss   . Unspecified urinary incontinence   . Abnormality of gait   . Unspecified hypothyroidism   . Unspecified vitamin D deficiency   . Anemia, unspecified   . Coronary atherosclerosis of native coronary artery   . Acute systolic heart failure   . Long term (current) use of anticoagulants   . Primary pulmonary hypertension   . Cardiomyopathy in other diseases classified elsewhere   . Other seborrheic dermatitis   . Other and unspecified angina pectoris   . Other and unspecified hyperlipidemia   . Slowing of urinary stream   . Type II or unspecified type diabetes mellitus without mention of complication,  not stated as uncontrolled   . Unspecified essential hypertension   . Encounter for long-term (current) use of other medications   . Esophageal reflux   . Edema   . Shortness of breath   . Hydrocele, unspecified   . Impotence of organic origin    Past Surgical History  Procedure Laterality Date  . Cholecystectomy    . Placement of a veriflex bare-metal sten    . Coronary angioplasty with stent placement      VeriFLEX bare-metal stent   Social History:   reports that he quit smoking about 14 years ago. His smoking use included Cigarettes. He has a 50 pack-year smoking  history. He has never used smokeless tobacco. He reports that he drinks about 8.4 ounces of alcohol per week. He reports that he does not use illicit drugs.  Family History  Problem Relation Age of Onset  . Diabetes Mother   . Stroke Father   . Cancer Sister     Medications: Patient's Medications  New Prescriptions   No medications on file  Previous Medications   AMIODARONE (PACERONE) 200 MG TABLET    Take 0.5 tablets (100 mg total) by mouth daily.   ASPIRIN EC 81 MG TABLET    Take 81 mg by mouth daily.   CARVEDILOL (COREG) 6.25 MG TABLET    Take 6.25 mg by mouth daily.   FUROSEMIDE (LASIX) 20 MG TABLET    Take 20 mg by mouth 2 (two) times daily.   HYDRALAZINE (APRESOLINE) 25 MG TABLET    Take 0.5 tablets (12.5 mg total) by mouth 3 (three) times daily.   INSULIN GLARGINE (LANTUS) 100 UNIT/ML INJECTION    Inject 5 Units into the skin at bedtime.   ISOSORBIDE MONONITRATE (IMDUR) 30 MG 24 HR TABLET    Take 1 tablet (30 mg total) by mouth daily.   LEVOTHYROXINE (SYNTHROID, LEVOTHROID) 25 MCG TABLET    Take 25 mcg by mouth daily before breakfast.   NITROGLYCERIN (NITROSTAT) 0.4 MG SL TABLET    Place 0.4 mg under the tongue every 5 (five) minutes as needed.  Modified Medications   No medications on file  Discontinued Medications   No medications on file     Physical Exam:  Filed Vitals:   01/24/13 1552  BP: 138/70  Pulse: 59  Temp: 97.5 F (36.4 C)  TempSrc: Oral  Resp: 16  Height: 5\' 10"  (1.778 m)  Weight: 189 lb (85.73 kg)  SpO2: 98%    Physical Exam  Constitutional: He is oriented to person, place, and time and well-developed, well-nourished, and in no distress. No distress.  HENT:  Head: Normocephalic and atraumatic.  Mouth/Throat: Oropharynx is clear and moist.  Eyes: Conjunctivae and EOM are normal. Pupils are equal, round, and reactive to light.  Neck: Normal range of motion. Neck supple.  Cardiovascular: Normal rate, regular rhythm and normal heart sounds.    Lower extremity edema; pink due to venous congestion; no heat or drainage    Pulmonary/Chest: Effort normal and breath sounds normal.  Abdominal: Soft. Bowel sounds are normal.  Musculoskeletal: Normal range of motion.  Neurological: He is alert and oriented to person, place, and time.  Skin: Skin is warm and dry. He is not diaphoretic.  Psychiatric: Affect normal.     Labs reviewed: Basic Metabolic Panel:  Recent Labs  16/10/96 1655  12/09/12 0952 12/10/12 0425 12/11/12 0530  NA  --   < > 131* 132* 134*  K  --   < >  3.6 3.8 3.5  CL  --   < > 97 96 97  CO2  --   < > 24 26 24   GLUCOSE  --   < > 186* 105* 110*  BUN  --   < > 28* 33* 41*  CREATININE 1.89*  < > 1.94* 2.21* 2.22*  CALCIUM  --   < > 8.6 8.9 8.8  TSH 4.213  --   --   --   --   < > = values in this interval not displayed. Liver Function Tests:  Recent Labs  12/06/12 1032 12/06/12 1655 12/07/12 0625  AST 35 33 29  ALT 22 21 20   ALKPHOS 80 72 67  BILITOT 1.5* 1.6* 1.5*  PROT 6.5 6.0 5.6*  ALBUMIN 3.2* 2.9* 2.7*   No results found for this basename: LIPASE, AMYLASE,  in the last 8760 hours  Recent Labs  12/06/12 1701  AMMONIA 20   CBC:  Recent Labs  12/06/12 1032 12/06/12 1655 12/07/12 0625  WBC 10.1 9.6 7.4  NEUTROABS 7.5  --   --   HGB 11.0* 11.2* 10.3*  HCT 33.1* 32.5* 30.3*  MCV 91.4 91.8 91.3  PLT 166 155 144*   Labs from [place]: CBC with Diff       Result: 01/17/2013 10:54 AM    ( Status: F )       C     WBC  7.7        4.0-10.5  K/uL  SLN       RBC  3.41     L  4.22-5.81  MIL/uL  SLN       Hemoglobin  10.5     L  13.0-17.0  g/dL  SLN       Hematocrit  31.4     L  39.0-52.0  %  SLN       MCV  92.1        78.0-100.0  fL  SLN       MCH  30.8        26.0-34.0  pg  SLN       MCHC  33.4        30.0-36.0  g/dL  SLN       RDW  95.6     H  11.5-15.5  %  SLN       Platelet Count  175        150-400  K/uL  SLN       Granulocyte %  59        43-77  %  SLN       Absolute Gran  4.6         1.7-7.7  K/uL  SLN       Lymph %  22        12-46  %  SLN       Absolute Lymph  1.7        0.7-4.0  K/uL  SLN       Mono %  12        3-12  %  SLN       Absolute Mono  0.9        0.1-1.0  K/uL  SLN       Eos %  6     H  0-5  %  SLN       Absolute Eos  0.5        0.0-0.7  K/uL  SLN  Baso %  1        0-1  %  SLN       Absolute Baso  0.0        0.0-0.1  K/uL  SLN       Smear Review  Criteria for review not met   SLN      Basic Metabolic Panel       Result: 01/17/2013 12:08 PM    ( Status: F )            Sodium  142        135-145  mEq/L  SLN       Potassium  3.5        3.5-5.3  mEq/L  SLN       Chloride  107        96-112  mEq/L  SLN       CO2  28        19-32  mEq/L  SLN       Glucose  76        70-99  mg/dL  SLN       BUN  27     H  6-23  mg/dL  SLN       Creatinine  2.25     H  0.50-1.35  mg/dL  SLN       Calcium  8.5        8.4-10.5  mg/dL  SLN          CBC with Diff       Result: 12/18/2012 3:06 PM    ( Status: F )            WBC  7.9        4.0-10.5  K/uL  SLN       RBC  3.39     L  4.22-5.81  MIL/uL  SLN       Hemoglobin  10.3     L  13.0-17.0  g/dL  SLN       Hematocrit  31.1     L  39.0-52.0  %  SLN       MCV  91.7        78.0-100.0  fL  SLN       MCH  30.4        26.0-34.0  pg  SLN       MCHC  33.1        30.0-36.0  g/dL  SLN       RDW  30.8     H  11.5-15.5  %  SLN       Platelet Count  318        150-400  K/uL  SLN       Granulocyte %  69        43-77  %  SLN       Absolute Gran  5.3        1.7-7.7  K/uL  SLN       Lymph %  19        12-46  %  SLN       Absolute Lymph  1.5        0.7-4.0  K/uL  SLN       Mono %  9        3-12  %  SLN       Absolute Mono  0.7        0.1-1.0  K/uL  SLN       Eos %  3        0-5  %  SLN       Absolute Eos  0.3        0.0-0.7  K/uL  SLN       Baso %  0        0-1  %  SLN       Absolute Baso  0.0        0.0-0.1  K/uL  SLN       Smear Review  Criteria for review not met   SLN      Basic Metabolic Panel       Result: 12/18/2012 4:03  PM    ( Status: F )            Sodium  137        135-145  mEq/L  SLN       Potassium  3.9        3.5-5.3  mEq/L  SLN       Chloride  105        96-112  mEq/L  SLN       CO2  22        19-32  mEq/L  SLN       Glucose  122     H  70-99  mg/dL  SLN       BUN  33     H  6-23  mg/dL  SLN       Creatinine  2.26     H  0.50-1.35  mg/dL  SLN       Calcium  8.8        8.4-10.5  mg/dL  SLN      TSH, Ultrasensitive       Result: 12/18/2012 3:21 PM    ( Status: F )            TSH  6.005     H  0.350-4.500  uIU/mL  SLN        Assessment/Plan ANEMIA, IRON DEFICIENCY Follow up cbc  DM Will cont lantus 5 units qhs- to bring blood sugar log to next visit  Debility To cont therapy; Rx written for wheelchair with anti- tip, wheelchair cushion, 3:1 commode Sister reports she has not heard from SW will give Rx for SW to discuss options for the future regarding Mr Bonsell since [facility] feels like it would be unsafe to leave him at home alone.    Thyroid will increase synthroid to and recheck TSH in 6 weeks  Memory loss per MMSE indicating mild memory impairment; will start pt on Aricept 5 mg daily to preserve function

## 2013-01-24 NOTE — Assessment & Plan Note (Signed)
Will need bmp followed up as outpatient

## 2013-01-24 NOTE — Patient Instructions (Addendum)
Keep follow up with Dr Chilton Si in 1 month  Lab in 6 week

## 2013-01-24 NOTE — Assessment & Plan Note (Signed)
Follow up cbc

## 2013-01-24 NOTE — Assessment & Plan Note (Signed)
Will cont lantus 5 units qhs- to bring blood sugar log to next visit

## 2013-01-24 NOTE — Assessment & Plan Note (Signed)
Stable will need to be followed up as outpatient

## 2013-01-24 NOTE — Assessment & Plan Note (Signed)
To cont therapy; Rx written for wheelchair with anti- tip, wheelchair cushion, 3:1 commode Sister reports she has not heard from SW will give Rx for SW to discuss options for the future regarding Mr Bosher since [facility] feels like it would be unsafe to leave him at home alone.

## 2013-01-24 NOTE — Progress Notes (Signed)
Passed clock drawing. Given by Amy Roberson, CMA. 

## 2013-01-24 NOTE — Assessment & Plan Note (Signed)
Patient is stable; continue current regimen. 

## 2013-01-24 NOTE — Progress Notes (Signed)
Patient ID: Joel Walker, male   DOB: [birth year], 79 y.o.   MRN: [id number]  Nursing Home Location:  [facility]   Place of Service: SNF (31)   Chief Complaint: AV: evaluation for discharge   HPI:  Pt is a 79 y.o. male CAD, chronic systolic CHF, ischemic CM, LV apical thrombus, DVT, PAF who has previous lived alone and is cared for by his significant other's niece, Elita Quick, who lives next door. He was found down in urine and stool by Pam who comes in each morning to give him breakfast and his medications. He was confused so he was taken to the ED. his sister also noted his legs are more swollen than usual and the right LE is bright pink.  During patients hospitalization was starte on diuresis with IV Lasix , cardiac enzymes were cycled and came back negative. Daily weights were also closely monitored and he was placed on a low sodium diet. Cardiology was consulted and they followed patient and adjusted his medications, the lisinopril was discontinued because of his renal function and he was placed on hydralazine and nitrates instead. He was maintained on Coreg which was continued upon discharge  -echo on 4/2 with EF of 30%, CVA or hypokinesis of the posterior wall, hypokinesis of the inferior wall and akinesis of the mid to apical inferior septal and anteroseptal walls and akinesis of the true apex (Last echo in 2011 showed EF of 30 - 35%) Pts renal function was monitored closely with diuresis and on followup on 4/6 his creatinine was up to 2.2, and his Lasix was held-and on recheck today is still 2.2 and cardiology recommended to hold Lasix again today and resume it tomorrow at a decreased dose of 20 mg daily and followup outpatient  Evaluation of fall and possible syncope included cardiac enzymes which were back negative, CT scan of head showed no acute findings, and MRI it was done and was negative for acute intracranial findings. Cardiology was consulted and followed patient and  initially stated consider a 30 day outpatient monitor-but on followup today Dr.McAlhany status patient does not need outpatient monitor. Pt with complaint of dizziness while in the hospital and his amiodarone dose was decreased to 100 mg (from 200 mg) as it was noted that it could cause dizziness. The patient had an EEG done which did not show any epileptiform activity.TSH wnl... PT / OT evaluation was done and SNF was recommened and he is now at [facility] for Rehab.  Pts Right lower extremity cellulitis which had worsen has now improved after treatment with zoysn. Patient currently doing well with therapy, now stable to discharge home with home health.   Review of Systems:  Review of Systems  Constitutional: Negative for fever and chills.  HENT: Negative for congestion and sore throat.   Respiratory: Negative for cough and shortness of breath.   Cardiovascular: Negative for chest pain, palpitations and claudication. Leg swelling: at baseline.  Gastrointestinal: Negative for heartburn, abdominal pain, diarrhea and constipation.  Genitourinary: Negative for dysuria.  Musculoskeletal: Negative for myalgias.  Skin: Negative for itching and rash.  Neurological: Negative for dizziness, weakness and headaches.  Endo/Heme/Allergies: Does not bruise/bleed easily.  Psychiatric/Behavioral: Negative for depression. The patient is not nervous/anxious and does not have insomnia.      Medications: Patient's Medications  New Prescriptions   DONEPEZIL (ARICEPT) 5 MG TABLET    Take 1 tablet (5 mg total) by mouth at bedtime as needed.   LEVOTHYROXINE (SYNTHROID, LEVOTHROID)  50 MCG TABLET    Take 1 tablet (50 mcg total) by mouth daily.  Previous Medications   AMIODARONE (PACERONE) 200 MG TABLET    Take 0.5 tablets (100 mg total) by mouth daily.   ASPIRIN EC 81 MG TABLET    Take 81 mg by mouth daily.   CARVEDILOL (COREG) 6.25 MG TABLET    Take 6.25 mg by mouth daily.   FUROSEMIDE (LASIX) 20 MG TABLET     Take 20 mg by mouth 2 (two) times daily.   HYDRALAZINE (APRESOLINE) 25 MG TABLET    Take 0.5 tablets (12.5 mg total) by mouth 3 (three) times daily.   INSULIN GLARGINE (LANTUS) 100 UNIT/ML INJECTION    Inject 5 Units into the skin at bedtime.   ISOSORBIDE MONONITRATE (IMDUR) 30 MG 24 HR TABLET    Take 1 tablet (30 mg total) by mouth daily.   NITROGLYCERIN (NITROSTAT) 0.4 MG SL TABLET    Place 0.4 mg under the tongue every 5 (five) minutes as needed.  Modified Medications   No medications on file  Discontinued Medications   DOXYCYCLINE (VIBRA-TABS) 100 MG TABLET    Take 1 tablet (100 mg total) by mouth every 12 (twelve) hours.   INSULIN ASPART (NOVOLOG) 100 UNIT/ML INJECTION    Inject 3 Units into the skin 3 (three) times daily with meals.   INSULIN ASPART (NOVOLOG) 100 UNIT/ML INJECTION    Inject 0-9 Units into the skin 3 (three) times daily with meals.   LEVOTHYROXINE (SYNTHROID, LEVOTHROID) 25 MCG TABLET    Take 25 mcg by mouth daily before breakfast.     Physical Exam:  Filed Vitals:   01/17/13 1153  BP: 142/70  Pulse: 54  Temp: 97.3 F (36.3 C)  Resp: 18   Physical Exam  Constitutional: He appears well-developed and well-nourished. No distress.  Cardiovascular: Normal rate and regular rhythm.   Pulmonary/Chest: Effort normal and breath sounds normal. No respiratory distress.  Abdominal: Soft. Bowel sounds are normal.  Musculoskeletal: He exhibits edema (2+ bilaterally). He exhibits no tenderness.  Able to self propel in wheelchair; unsteady cautious gait.   Neurological: He is alert.  Skin: Skin is warm and dry. He is not diaphoretic.    Labs reviewed: CBC with Diff       Result: 12/18/2012 3:06 PM    ( Status: F )            WBC  7.9        4.0-10.5  K/uL  SLN       RBC  3.39     L  4.22-5.81  MIL/uL  SLN       Hemoglobin  10.3     L  13.0-17.0  g/dL  SLN       Hematocrit  31.1     L  39.0-52.0  %  SLN       MCV  91.7        78.0-100.0  fL  SLN       MCH  30.4         26.0-34.0  pg  SLN       MCHC  33.1        30.0-36.0  g/dL  SLN       RDW  16.1     H  11.5-15.5  %  SLN       Platelet Count  318        150-400  K/uL  SLN  Granulocyte %  69        43-77  %  SLN       Absolute Gran  5.3        1.7-7.7  K/uL  SLN       Lymph %  19        12-46  %  SLN       Absolute Lymph  1.5        0.7-4.0  K/uL  SLN       Mono %  9        3-12  %  SLN       Absolute Mono  0.7        0.1-1.0  K/uL  SLN       Eos %  3        0-5  %  SLN       Absolute Eos  0.3        0.0-0.7  K/uL  SLN       Baso %  0        0-1  %  SLN       Absolute Baso  0.0        0.0-0.1  K/uL  SLN       Smear Review  Criteria for review not met   SLN      Basic Metabolic Panel       Result: 12/18/2012 4:03 PM    ( Status: F )            Sodium  137        135-145  mEq/L  SLN       Potassium  3.9        3.5-5.3  mEq/L  SLN       Chloride  105        96-112  mEq/L  SLN       CO2  22        19-32  mEq/L  SLN       Glucose  122     H  70-99  mg/dL  SLN       BUN  33     H  6-23  mg/dL  SLN       Creatinine  2.26     H  0.50-1.35  mg/dL  SLN       Calcium  8.8        8.4-10.5  mg/dL  SLN      TSH, Ultrasensitive       Result: 12/18/2012 3:21 PM    ( Status: F )            TSH  6.005     H  0.350-4.500  uIU/mL  SLN       Assessment/Plan DM Currently on lantus 5 units at night- to cont this at discharge- will need home health nursing for diabetic and insulin teaching Will need to have blood sugars check twice daily   Bilateral cellulitis of lower leg Improved- pink to right leg remaining but no warmth to skin- most likely venous vasular congestion- for hh nursing to cont to monitor- pt doesn't notelevate legs due to self propelling himself in his wheelchair   ANEMIA, IRON DEFICIENCY Stable will need to be followed up as outpatient   SYSTOLIC HEART FAILURE, ACUTE Patient is stable; continue current regimen.  RENAL DISEASE, CHRONIC, STAGE III Will need bmp followed up as outpatient     pt  is stable for discharge-will need PT/OT/Nursing/hha/sw per home health. No DME needed. Rx written.  will need to follow up with PCP within 2 weeks.   Labs/tests ordered

## 2013-02-01 ENCOUNTER — Ambulatory Visit (INDEPENDENT_AMBULATORY_CARE_PROVIDER_SITE_OTHER): Payer: Medicare Other | Admitting: Nurse Practitioner

## 2013-02-01 ENCOUNTER — Encounter: Payer: Self-pay | Admitting: Nurse Practitioner

## 2013-02-01 VITALS — BP 132/80 | HR 55 | Temp 98.0°F | Resp 14

## 2013-02-01 DIAGNOSIS — N183 Chronic kidney disease, stage 3 unspecified: Secondary | ICD-10-CM

## 2013-02-01 DIAGNOSIS — M25551 Pain in right hip: Secondary | ICD-10-CM

## 2013-02-01 DIAGNOSIS — I5021 Acute systolic (congestive) heart failure: Secondary | ICD-10-CM

## 2013-02-01 DIAGNOSIS — M25559 Pain in unspecified hip: Secondary | ICD-10-CM

## 2013-02-01 MED ORDER — TRAMADOL HCL 50 MG PO TABS: 50.0000 mg | ORAL_TABLET | Freq: Four times a day (QID) | ORAL | Status: DC | PRN

## 2013-02-01 NOTE — Progress Notes (Signed)
Patient ID: Joel Walker, male   DOB: [birth year], 79 y.o.   MRN: [id number]   Allergies  Allergen Reactions  . Lisinopril     REACTION: Cough. Unsure if this is a true allergy. Pt takes this med everyday with out any known side effects per family.    Chief Complaint  Patient presents with  . Complains Right hip and side pain    HPI: Patient is a 79 y.o. male seen in the office today for acute visit due to ongoing right hip and side pain Which has been going on since inpatient rehab but pain is getting worse as well as mobility.  Reports that he is draging leg when he trys to walk making him very unsteady. Complaining of numbness at times. tylenol is not working would like something different.  Also reports icreased edema in lower extremities which feel tight. Does not elevate legs as he should; pt is wearing ted hose. No open areas to lower extremies or increase in redness or warmth  No chest pain, worsening shortness of breath, fevers or chills.   Review of Systems:  Review of Systems  Constitutional: Negative for fever, chills and malaise/fatigue.  Respiratory: Negative for cough and shortness of breath.   Cardiovascular: Positive for leg swelling. Negative for chest pain and palpitations.  Musculoskeletal: Positive for myalgias and joint pain (left hip ). Negative for back pain and falls.  Skin: Negative.  Negative for rash.  Neurological: Positive for weakness. Negative for dizziness and tingling.     Past Medical History  Diagnosis Date  . SYSTOLIC HEART FAILURE, ACUTE   . PULMONARY HYPERTENSION   . PAROXYSMAL ATRIAL FIBRILLATION   . HYPERTENSION   . DVT   . CARDIOMYOPATHY   . CAD, NATIVE VESSEL   . TOBACCO USE, QUIT   . RENAL DISEASE, CHRONIC, STAGE III   . MURAL THROMBUS, APEX OF HEART   . Long term current use of anticoagulant   . HYPOKALEMIA   . DM   . ANEMIA, IRON DEFICIENCY   . CHF (congestive heart failure)   . Senile cataract, unspecified   . Other  specified disease of nail   . Unspecified hearing loss   . Chronic kidney disease, stage II (mild)   . Memory loss   . Unspecified urinary incontinence   . Abnormality of gait   . Unspecified hypothyroidism   . Unspecified vitamin D deficiency   . Anemia, unspecified   . Coronary atherosclerosis of native coronary artery   . Acute systolic heart failure   . Long term (current) use of anticoagulants   . Primary pulmonary hypertension   . Cardiomyopathy in other diseases classified elsewhere   . Other seborrheic dermatitis   . Other and unspecified angina pectoris   . Other and unspecified hyperlipidemia   . Slowing of urinary stream   . Type II or unspecified type diabetes mellitus without mention of complication, not stated as uncontrolled   . Unspecified essential hypertension   . Encounter for long-term (current) use of other medications   . Esophageal reflux   . Edema   . Shortness of breath   . Hydrocele, unspecified   . Impotence of organic origin    Past Surgical History  Procedure Laterality Date  . Cholecystectomy    . Placement of a veriflex bare-metal sten    . Coronary angioplasty with stent placement      VeriFLEX bare-metal stent   Social History:   reports that he quit  smoking about 14 years ago. His smoking use included Cigarettes. He has a 50 pack-year smoking history. He has never used smokeless tobacco. He reports that he drinks about 8.4 ounces of alcohol per week. He reports that he does not use illicit drugs.  Family History  Problem Relation Age of Onset  . Diabetes Mother   . Stroke Father   . Cancer Sister     Medications: Patient's Medications  New Prescriptions   TRAMADOL (ULTRAM) 50 MG TABLET    Take 1 tablet (50 mg total) by mouth every 6 (six) hours as needed for pain.  Previous Medications   AMIODARONE (PACERONE) 200 MG TABLET    Take 0.5 tablets (100 mg total) by mouth daily.   ASPIRIN EC 81 MG TABLET    Take 81 mg by mouth daily.    CARVEDILOL (COREG) 6.25 MG TABLET    Take 6.25 mg by mouth daily.   DONEPEZIL (ARICEPT) 5 MG TABLET    Take 1 tablet (5 mg total) by mouth at bedtime as needed.   FUROSEMIDE (LASIX) 20 MG TABLET    Take 20 mg by mouth 2 (two) times daily.   HYDRALAZINE (APRESOLINE) 25 MG TABLET    Take 0.5 tablets (12.5 mg total) by mouth 3 (three) times daily.   INSULIN GLARGINE (LANTUS) 100 UNIT/ML INJECTION    Inject 5 Units into the skin at bedtime.   ISOSORBIDE MONONITRATE (IMDUR) 30 MG 24 HR TABLET    Take 1 tablet (30 mg total) by mouth daily.   LEVOTHYROXINE (SYNTHROID, LEVOTHROID) 50 MCG TABLET    Take 1 tablet (50 mcg total) by mouth daily.   NITROGLYCERIN (NITROSTAT) 0.4 MG SL TABLET    Place 0.4 mg under the tongue every 5 (five) minutes as needed.  Modified Medications   No medications on file  Discontinued Medications   No medications on file     Physical Exam:  Filed Vitals:   02/01/13 0947  BP: 132/80  Pulse: 55  Temp: 98 F (36.7 C)  TempSrc: Oral  Resp: 14   Physical Exam  Constitutional: He appears well-developed and well-nourished. No distress.  HENT:  Head: Normocephalic and atraumatic.  Cardiovascular: Normal rate, regular rhythm and normal heart sounds.   Pulmonary/Chest: Effort normal and breath sounds normal.  Abdominal: Soft. Bowel sounds are normal.  Musculoskeletal: He exhibits edema.       Right hip: He exhibits decreased strength and tenderness. He exhibits no swelling.       Right knee: Normal.  Neurological: He is alert.  Skin: He is not diaphoretic.  Psychiatric: He has a normal mood and affect. He exhibits abnormal recent memory.    Labs reviewed: Basic Metabolic Panel:  Recent Labs  84/13/24 1655  12/09/12 0952 12/10/12 0425 12/11/12 0530  NA  --   < > 131* 132* 134*  K  --   < > 3.6 3.8 3.5  CL  --   < > 97 96 97  CO2  --   < > 24 26 24   GLUCOSE  --   < > 186* 105* 110*  BUN  --   < > 28* 33* 41*  CREATININE 1.89*  < > 1.94* 2.21* 2.22*   CALCIUM  --   < > 8.6 8.9 8.8  TSH 4.213  --   --   --   --   < > = values in this interval not displayed. Liver Function Tests:  Recent Labs  12/06/12 1032 12/06/12  1655 12/07/12 0625  AST 35 33 29  ALT 22 21 20   ALKPHOS 80 72 67  BILITOT 1.5* 1.6* 1.5*  PROT 6.5 6.0 5.6*  ALBUMIN 3.2* 2.9* 2.7*   No results found for this basename: LIPASE, AMYLASE,  in the last 8760 hours  Recent Labs  12/06/12 1701  AMMONIA 20   CBC:  Recent Labs  12/06/12 1032 12/06/12 1655 12/07/12 0625  WBC 10.1 9.6 7.4  NEUTROABS 7.5  --   --   HGB 11.0* 11.2* 10.3*  HCT 33.1* 32.5* 30.3*  MCV 91.4 91.8 91.3  PLT 166 155 144*   Lipid Panel: No results found for this basename: CHOL, HDL, LDLCALC, TRIG, CHOLHDL, LDLDIRECT,  in the last 8760 hours  Past Procedures:     Assessment/Plan Hip pain worsening of hip and leg pain- unable to bear weight without pain and dragging leg. Pt very unstable. Will make referral to ortho for further evaluation of hip. May use tramadol every 6 hours as needed for pain   SYSTOLIC HEART FAILURE, ACUTE No increase in shortness of breath but increased edema; will increase lasix to 40 in am and 20 mg at 2 pm for 1 week. Will get bmp today  RENAL DISEASE, CHRONIC, STAGE III Will follow up BMP today

## 2013-02-01 NOTE — Assessment & Plan Note (Signed)
Will follow up BMP today

## 2013-02-01 NOTE — Patient Instructions (Addendum)
To increase lasix to 1/2 tablet (20 mg) at 2 pm for 1 week to help with swelling Eat at banana every other day to supplement potassium while on the additional 1/2 tablet Cont to elevate legs and use TED hose  Will make referral to ortho for further evaluation of hip May take tramadol every 6 hours as needed for pain

## 2013-02-01 NOTE — Assessment & Plan Note (Signed)
No increase in shortness of breath but increased edema; will increase lasix to 40 in am and 20 mg at 2 pm for 1 week. Will get bmp today

## 2013-02-01 NOTE — Assessment & Plan Note (Signed)
worsening of hip and leg pain- unable to bear weight without pain and dragging leg. Pt very unstable. Will make referral to ortho for further evaluation of hip. May use tramadol every 6 hours as needed for pain

## 2013-02-02 ENCOUNTER — Telehealth: Payer: Self-pay | Admitting: Geriatric Medicine

## 2013-02-02 LAB — BASIC METABOLIC PANEL
BUN/Creatinine Ratio: 11 (ref 10–22)
BUN: 22 mg/dL (ref 8–27)
CO2: 26 mmol/L (ref 19–28)
Calcium: 9.1 mg/dL (ref 8.6–10.2)
Chloride: 98 mmol/L (ref 97–108)
Glucose: 106 mg/dL — ABNORMAL HIGH (ref 65–99)

## 2013-02-02 NOTE — Telephone Encounter (Signed)
Joel Walker, from [facility] called and said that the patient's pulse is 48 this morning. The patient takes 200mg  of Amiodarone daily.  He is not feeling very good. Please advise if you would like to cut the Amiodarone. The hospital bed situation has been resolved and he will need orders for a hospital bed. Please call Pam at [phone] with medication changes. Thanks.

## 2013-02-02 NOTE — Telephone Encounter (Signed)
Pam called back and I informed her of the medication changes.

## 2013-02-02 NOTE — Telephone Encounter (Signed)
I spoke with Shanda Bumps. She said that the patient should be taking 100mg  daily of amiodarone, not 200mg . Also, check the patient's pulse. If pulse is below 60, hold carvedilol. This should be a standing order. The caregiver's name that handles Joel Walker medications is Pam. Her number is [phone]. I called and left a message on her voice mail asking her to call me back, so I could inform her of the medication changes.

## 2013-02-06 ENCOUNTER — Telehealth: Payer: Self-pay | Admitting: Geriatric Medicine

## 2013-02-06 ENCOUNTER — Inpatient Hospital Stay (HOSPITAL_COMMUNITY): Payer: Medicare Other

## 2013-02-06 ENCOUNTER — Encounter (HOSPITAL_COMMUNITY): Payer: Self-pay | Admitting: Emergency Medicine

## 2013-02-06 ENCOUNTER — Emergency Department (HOSPITAL_COMMUNITY): Payer: Medicare Other

## 2013-02-06 ENCOUNTER — Inpatient Hospital Stay (HOSPITAL_COMMUNITY)
Admission: EM | Admit: 2013-02-06 | Discharge: 2013-02-06 | DRG: 536 | Disposition: A | Discharge status: Home / Self Care | Payer: Medicare Other | Attending: Internal Medicine | Admitting: Internal Medicine

## 2013-02-06 DIAGNOSIS — Z79899 Other long term (current) drug therapy: Secondary | ICD-10-CM

## 2013-02-06 DIAGNOSIS — I5022 Chronic systolic (congestive) heart failure: Secondary | ICD-10-CM | POA: Diagnosis present

## 2013-02-06 DIAGNOSIS — Z87891 Personal history of nicotine dependence: Secondary | ICD-10-CM

## 2013-02-06 DIAGNOSIS — N189 Chronic kidney disease, unspecified: Secondary | ICD-10-CM | POA: Diagnosis present

## 2013-02-06 DIAGNOSIS — I428 Other cardiomyopathies: Secondary | ICD-10-CM | POA: Diagnosis present

## 2013-02-06 DIAGNOSIS — I129 Hypertensive chronic kidney disease with stage 1 through stage 4 chronic kidney disease, or unspecified chronic kidney disease: Secondary | ICD-10-CM | POA: Diagnosis present

## 2013-02-06 DIAGNOSIS — Z515 Encounter for palliative care: Secondary | ICD-10-CM

## 2013-02-06 DIAGNOSIS — I2510 Atherosclerotic heart disease of native coronary artery without angina pectoris: Secondary | ICD-10-CM | POA: Diagnosis present

## 2013-02-06 DIAGNOSIS — S72033A Displaced midcervical fracture of unspecified femur, initial encounter for closed fracture: Principal | ICD-10-CM | POA: Diagnosis present

## 2013-02-06 DIAGNOSIS — I509 Heart failure, unspecified: Secondary | ICD-10-CM | POA: Diagnosis present

## 2013-02-06 DIAGNOSIS — S0003XA Contusion of scalp, initial encounter: Secondary | ICD-10-CM | POA: Diagnosis present

## 2013-02-06 DIAGNOSIS — W010XXA Fall on same level from slipping, tripping and stumbling without subsequent striking against object, initial encounter: Secondary | ICD-10-CM | POA: Diagnosis present

## 2013-02-06 DIAGNOSIS — S1093XA Contusion of unspecified part of neck, initial encounter: Secondary | ICD-10-CM | POA: Diagnosis present

## 2013-02-06 DIAGNOSIS — Y92009 Unspecified place in unspecified non-institutional (private) residence as the place of occurrence of the external cause: Secondary | ICD-10-CM

## 2013-02-06 DIAGNOSIS — Z794 Long term (current) use of insulin: Secondary | ICD-10-CM

## 2013-02-06 DIAGNOSIS — N183 Chronic kidney disease, stage 3 unspecified: Secondary | ICD-10-CM | POA: Diagnosis present

## 2013-02-06 DIAGNOSIS — I4891 Unspecified atrial fibrillation: Secondary | ICD-10-CM | POA: Diagnosis present

## 2013-02-06 DIAGNOSIS — E119 Type 2 diabetes mellitus without complications: Secondary | ICD-10-CM | POA: Diagnosis present

## 2013-02-06 DIAGNOSIS — S72011A Unspecified intracapsular fracture of right femur, initial encounter for closed fracture: Secondary | ICD-10-CM

## 2013-02-06 LAB — URINALYSIS, ROUTINE W REFLEX MICROSCOPIC
Hgb urine dipstick: NEGATIVE
Protein, ur: NEGATIVE mg/dL
Urobilinogen, UA: 1 mg/dL (ref 0.0–1.0)

## 2013-02-06 LAB — BASIC METABOLIC PANEL
Calcium: 9.5 mg/dL (ref 8.4–10.5)
Chloride: 100 mEq/L (ref 96–112)
Creatinine, Ser: 1.94 mg/dL — ABNORMAL HIGH (ref 0.50–1.35)
GFR calc Af Amer: 36 mL/min — ABNORMAL LOW (ref >90)

## 2013-02-06 LAB — CBC
MCV: 92.8 fL (ref 78.0–100.0)
Platelets: 164 10*3/uL (ref 150–400)
RDW: 14.1 % (ref 11.5–15.5)
WBC: 8.1 10*3/uL (ref 4.0–10.5)

## 2013-02-06 LAB — PRO B NATRIURETIC PEPTIDE: Pro B Natriuretic peptide (BNP): 2842 pg/mL — ABNORMAL HIGH (ref 0–450)

## 2013-02-06 MED ORDER — HYDROCODONE-ACETAMINOPHEN 5-325 MG PO TABS: 1.0000 | ORAL_TABLET | Freq: Four times a day (QID) | ORAL | Status: DC | PRN

## 2013-02-06 NOTE — ED Notes (Signed)
Pt denies any pain or questions upon discharge. 

## 2013-02-06 NOTE — ED Provider Notes (Signed)
Joel Walker is a 79 y.o. male who fell during the night, without clear cause. He has had ongoing right hip pain, for 2 months and is receiving in-home treatment by occupational therapy. He was able to walk after the fall and came here by private vehicle.  Exam: Alert, calm, and cooperative. Right forehead hematoma. Mild tenderness to palpation, but nearly normal passive range of motion. Neurologic is nonfocal.  Assessment: Acute right fracture, subcapital. Unclear source for ongoing right hip pain, raises concern for possible stress fracture, which was completed in a fall last night; and now, radiographically visible. He'll need assessment by orthopedics. He'll likely need admission for stabilization prior to a possible surgical procedure   Medical screening examination/treatment/procedure(s) were conducted as a shared visit with non-physician practitioner(s) and myself.  I personally evaluated the patient during the encounter  Flint Melter, MD 02/07/13 1742

## 2013-02-06 NOTE — Telephone Encounter (Signed)
Don from [facility] called. Patient fell last night and has a head injury. He said he was going to instruct the patient`s family to take them to the emergency room.

## 2013-02-06 NOTE — ED Provider Notes (Signed)
History     CSN: [id number]  Arrival date & time 02/06/13  1406   First MD Initiated Contact with Patient 02/06/13 1651      Chief Complaint  Patient presents with  . Fall    (Consider location/radiation/quality/duration/timing/severity/associated sxs/prior treatment) Patient is a 79 y.o. male presenting with fall.  Fall   Linkin Vizzini Turney is a 79 y.o. male with a history of diabetes, Afib (not on anticoagulation) CAD, CHF and chronic renal disease presents emergency department status post fall.  Patient currently lives alone at home and has comfort care and assistance with him around-the-clock.  Patient states that he recalls having a mechanical fall, tripping over a blanket and trying to avoid dog, however she did not remember events after a fall.  Point of impact unknown.  Loss of consciousness unknown.  Neighbor states that when she went over early this morning she noticed a hematoma on right for head and patient was complaining of worsened right hip pain when ambulating with walker.  Patient was brought to the emergency department by neighbor and sister.  Patient reports right hip and thigh pain since back in April, but that the incident last night significantly made things worse.  Patient denies any headaches, change in vision, numbness, weakness or tingling of extremities, syncope, chest pain, shortness of breath, fevers, night sweats or chills.  No other complaints this time.  Past Medical History  Diagnosis Date  . SYSTOLIC HEART FAILURE, ACUTE   . PULMONARY HYPERTENSION   . PAROXYSMAL ATRIAL FIBRILLATION   . HYPERTENSION   . DVT   . CARDIOMYOPATHY   . CAD, NATIVE VESSEL   . TOBACCO USE, QUIT   . RENAL DISEASE, CHRONIC, STAGE III   . MURAL THROMBUS, APEX OF HEART   . Long term current use of anticoagulant   . HYPOKALEMIA   . DM   . ANEMIA, IRON DEFICIENCY   . CHF (congestive heart failure)   . Senile cataract, unspecified   . Other specified disease of nail   .  Unspecified hearing loss   . Chronic kidney disease, stage II (mild)   . Memory loss   . Unspecified urinary incontinence   . Abnormality of gait   . Unspecified hypothyroidism   . Unspecified vitamin D deficiency   . Anemia, unspecified   . Coronary atherosclerosis of native coronary artery   . Acute systolic heart failure   . Long term (current) use of anticoagulants   . Primary pulmonary hypertension   . Cardiomyopathy in other diseases classified elsewhere   . Other seborrheic dermatitis   . Other and unspecified angina pectoris   . Other and unspecified hyperlipidemia   . Slowing of urinary stream   . Type II or unspecified type diabetes mellitus without mention of complication, not stated as uncontrolled   . Unspecified essential hypertension   . Encounter for long-term (current) use of other medications   . Esophageal reflux   . Edema   . Shortness of breath   . Hydrocele, unspecified   . Impotence of organic origin     Past Surgical History  Procedure Laterality Date  . Cholecystectomy    . Placement of a veriflex bare-metal sten    . Coronary angioplasty with stent placement      VeriFLEX bare-metal stent    Family History  Problem Relation Age of Onset  . Diabetes Mother   . Stroke Father   . Cancer Sister     History  Substance  Use Topics  . Smoking status: Former Smoker -- 1.00 packs/day for 50 years    Types: Cigarettes    Quit date: 09/06/1998  . Smokeless tobacco: Never Used     Comment: 12/06/2012 "smoked from 1950 to 2000"  . Alcohol Use: 8.4 oz/week    14 Cans of beer per week     Comment: 12/06/2012 "2-3 beers/day; have whiskey q now and then"      Review of Systems  All other systems reviewed and are negative.    Allergies  Lisinopril  Home Medications   Current Outpatient Rx  Name  Route  Sig  Dispense  Refill  . amiodarone (PACERONE) 200 MG tablet   Oral   Take 0.5 tablets (100 mg total) by mouth daily.         Marland Kitchen aspirin EC  81 MG tablet   Oral   Take 81 mg by mouth daily.         . carvedilol (COREG) 6.25 MG tablet   Oral   Take 6.25 mg by mouth daily.         Marland Kitchen donepezil (ARICEPT) 5 MG tablet   Oral   Take 1 tablet (5 mg total) by mouth at bedtime as needed.   30 tablet   1   . furosemide (LASIX) 20 MG tablet   Oral   Take 20 mg by mouth 2 (two) times daily.         . hydrALAZINE (APRESOLINE) 25 MG tablet   Oral   Take 0.5 tablets (12.5 mg total) by mouth 3 (three) times daily.         . insulin glargine (LANTUS) 100 UNIT/ML injection   Subcutaneous   Inject 5 Units into the skin at bedtime.         . isosorbide mononitrate (IMDUR) 30 MG 24 hr tablet   Oral   Take 1 tablet (30 mg total) by mouth daily.         Marland Kitchen levothyroxine (SYNTHROID, LEVOTHROID) 50 MCG tablet   Oral   Take 1 tablet (50 mcg total) by mouth daily.   90 tablet   3   . nitroGLYCERIN (NITROSTAT) 0.4 MG SL tablet   Sublingual   Place 0.4 mg under the tongue every 5 (five) minutes as needed.         . traMADol (ULTRAM) 50 MG tablet   Oral   Take 1 tablet (50 mg total) by mouth every 6 (six) hours as needed for pain.   90 tablet   3     BP 123/87  Pulse 84  Temp(Src) 98.7 F (37.1 C) (Oral)  Resp 20  SpO2 94%  Physical Exam  Nursing note and vitals reviewed. Constitutional: He appears well-developed and well-nourished. No distress.  HENT:  Head: Normocephalic.  Right frontal hematoma. No racoon sign or mastoid tenderness.   Eyes: Conjunctivae and EOM are normal.  No entrapment or orbital tenderness   Neck:  In cervical collar (soft placed in triage)   Cardiovascular:  Intact distal pulses, capillary refill < 3 seconds  Musculoskeletal:  Right hip ttp. unable to perform internal external rotation d/t pain. All other extremities with normal ROM  Neurological:  No sensory deficit  Skin: He is not diaphoretic.  Skin intact, no tenting    ED Course  Procedures (including critical care  time)  Labs Reviewed  URINALYSIS, ROUTINE W REFLEX MICROSCOPIC  CBC  BASIC METABOLIC PANEL   Dg Neck Soft Tissue  02/06/2013   *  RADIOLOGY REPORT*  Clinical Data: Fall, shoulder pain and hip pain.  NECK SOFT TISSUES - 1+ VIEW  Comparison: None.  Findings: Study was ordered as a soft tissue neck.  If there is concern for cervical spine fracture, formal cervical spine series is needed.  There is no evidence of retropharyngeal soft tissue swelling or epiglottic enlargement.  The cervical airway is unremarkable and no radio-opaque foreign body identified.  Mild anterolisthesis C4-5 and C5-6 is felt to be physiologic. Carotid calcification is suspected.  IMPRESSION: Negative soft tissue neck radiographs.  Formal plain films of the cervical spine are necessary to evaluate for neck injury.   Original Report Authenticated By: Davonna Belling, M.D.   Dg Shoulder Right  02/06/2013   *RADIOLOGY REPORT*  Clinical Data: Status post fall 1 day ago.  Pain.  RIGHT SHOULDER - 2+ VIEW  Comparison: None.  Findings: The humerus is located and the acromioclavicular joint is intact.  The patient has moderate to advanced glenohumeral degenerative disease.  Mild acromioclavicular osteoarthritis is present.  Imaged lung parenchyma and ribs are unremarkable.  IMPRESSION:  1.  No acute finding. 2.  Glenohumeral osteoarthritis.   Original Report Authenticated By: Holley Dexter, M.D.   Dg Hip Complete Right  02/06/2013   *RADIOLOGY REPORT*  Clinical Data: Fall, pain  RIGHT HIP - COMPLETE 2+ VIEW  Comparison: None.  Findings: There is an impacted subcapital right hip fracture with slight internal rotation.  There is no associated pelvic fracture. There may be mild skeletal osteopenia. Mild to moderate vascular calcification is noted.  IMPRESSION: Impacted subcapital right hip fracture.   Original Report Authenticated By: Davonna Belling, M.D.   The patient denies any bony neck pain. There is no tenderness on palpation of the cervical  spine and no step-offs. The patient can look to the left and right voluntarily without pain and flex and extend the neck without pain. Cervical collar cleared.   Consult orthopedic: Dr. Sherlean Foot, was in surgery- spoke with nurse who as made him aware of pt, he will consult on pt.   CRITICAL CARE Performed by: Jaci Carrel   Total critical care time: 30  Critical care time was exclusive of separately billable procedures and treating other patients.  Critical care was necessary to treat or prevent imminent or life-threatening deterioration.  Critical care was time spent personally by me on the following activities: development of treatment plan with patient and/or surrogate as well as nursing, discussions with consultants, evaluation of patient's response to treatment, examination of patient, obtaining history from patient or surrogate, ordering and performing treatments and interventions, ordering and review of laboratory studies, ordering and review of radiographic studies, pulse oximetry and re-evaluation of patient's condition.   Date: 02/06/2013  Rate: 62  Rhythm: normal sinus rhythm  QRS Axis: normal  Intervals: normal  ST/T Wave abnormalities: normal  Conduction Disutrbances:first-degree A-V block   Narrative Interpretation:   Old EKG Reviewed: none available   1. Subcapital fracture of femur, right, closed, initial encounter     BP 123/87  Pulse 84  Temp(Src) 98.7 F (37.1 C) (Oral)  Resp 20  SpO2 94%   MDM   79 year old male with mechanical fall last evening presents to the emergency department complaining of right hip pain. Right hip tenderness, neurovascularly intact and no focal neuro deficits on exam. Imaging reviewed with impacted subcapital right fracture. Patient to be admitted by hospitalist and consulted by orthopedic surgeon.  Consults as above.  Patient appears hemodynamically stable in no acute distress  and does not currently require pain medication.  Patient  case is seen and discussed with attending who is agreeable with disposition plan.  Answered questions the patient and family members.  Addendum: after speaking with family, Dr. Valentina Gu, and hospitalist to great lengths it has been agreed that patient will be discharged home with very strict followup with Dr. Rogue Jury office first thing Friday morning.  Patient will be discharged on pain medication. Orthopedic believes imaging results may not be acute and since patient is able to ambulate with a walker as well as has 24/7 assistance that patient can be discharged.  Discussed with attending who is agreeable with new plan.  Strict return cautions discussed.        Jaci Carrel, [place] 02/06/13 1941

## 2013-02-06 NOTE — ED Notes (Signed)
Pt BIB family members who state overnight pt had a fall while with pts sitters. Pt arrives with hematoma to right forehead, alert oriented x2 which per family is pts baseline. C/o pain to head, neck, right leg and right shoulder. Pt has not had anything for pain to this point.

## 2013-02-07 ENCOUNTER — Telehealth: Payer: Self-pay | Admitting: *Deleted

## 2013-02-07 NOTE — Telephone Encounter (Signed)
Patient sister, Delray Alt, called and stated that patient fell 2 days ago. She doesn't understand why because patient has 24 hour care. [facility] comes out yesterday with OT and was determined patient needed a hip x-ray right away and wanted to call 911. The patient family did not want to call 911 so they took him yesterday to [facility] to be checked. Sister stated that she felt forced to do this by the therapist. Had a hip fracture before and is fractured now also in about the same place. Hospital was prepping patient for surgery but stopped and sent him home with an appointment to see Dr. Valentina Gu on Friday 02/09/2013 @ 10:30am. Not sure why they didn't go ahead with the surgery.  Patient will follow up with Shanda Bumps on 02/12/2013. If any other Advice please call.

## 2013-02-12 ENCOUNTER — Ambulatory Visit (INDEPENDENT_AMBULATORY_CARE_PROVIDER_SITE_OTHER): Payer: Medicare Other | Admitting: Nurse Practitioner

## 2013-02-12 ENCOUNTER — Encounter: Payer: Self-pay | Admitting: Nurse Practitioner

## 2013-02-12 VITALS — BP 134/78 | HR 76 | Temp 98.0°F | Resp 14 | Ht 70.0 in | Wt 191.0 lb

## 2013-02-12 DIAGNOSIS — I5022 Chronic systolic (congestive) heart failure: Secondary | ICD-10-CM

## 2013-02-12 DIAGNOSIS — R413 Other amnesia: Secondary | ICD-10-CM

## 2013-02-12 DIAGNOSIS — E039 Hypothyroidism, unspecified: Secondary | ICD-10-CM

## 2013-02-12 DIAGNOSIS — M25559 Pain in unspecified hip: Secondary | ICD-10-CM

## 2013-02-12 DIAGNOSIS — I509 Heart failure, unspecified: Secondary | ICD-10-CM | POA: Insufficient documentation

## 2013-02-12 DIAGNOSIS — I270 Primary pulmonary hypertension: Secondary | ICD-10-CM

## 2013-02-12 NOTE — Progress Notes (Signed)
Patient ID: Joel Walker, male   DOB: [birth year], 79 y.o.   MRN: [id number]   Allergies  Allergen Reactions  . Lisinopril     REACTION: Cough. Unsure if this is a true allergy. Pt takes this med everyday with out any known side effects per family.    Chief Complaint  Patient presents with  . Medical Managment of Chronic Issues    need surgery clearance for hip surgery    HPI: Patient is a 79 y.o. male seen in the office today for follow up  Larey Seat at home 1 week ago went to the ED- found fx to right hip Pt reports pain is well controlled when he is not walking- walking makes pain worse and Tramadol helps  Pt is very clean he does not wish to have surgery at this time; pt reports he can still walk even though it hurts. Pt is working with therapy and has help around the clock    Review of Systems:  Review of Systems  Constitutional: Negative for fever, chills and malaise/fatigue.  Respiratory: Negative for shortness of breath.   Cardiovascular: Negative for chest pain.  Gastrointestinal: Negative for abdominal pain, diarrhea and constipation.  Genitourinary: Negative for dysuria.  Musculoskeletal: Positive for joint pain (hip).  Skin: Negative.   Neurological: Negative for weakness and headaches.  Psychiatric/Behavioral: Positive for memory loss.     Past Medical History  Diagnosis Date  . SYSTOLIC HEART FAILURE, ACUTE   . PULMONARY HYPERTENSION   . PAROXYSMAL ATRIAL FIBRILLATION   . HYPERTENSION   . DVT   . CARDIOMYOPATHY   . CAD, NATIVE VESSEL   . TOBACCO USE, QUIT   . RENAL DISEASE, CHRONIC, STAGE III   . MURAL THROMBUS, APEX OF HEART   . Long term current use of anticoagulant   . HYPOKALEMIA   . DM   . ANEMIA, IRON DEFICIENCY   . CHF (congestive heart failure)   . Senile cataract, unspecified   . Other specified disease of nail   . Unspecified hearing loss   . Chronic kidney disease, stage II (mild)   . Memory loss   . Unspecified urinary incontinence   .  Abnormality of gait   . Unspecified hypothyroidism   . Unspecified vitamin D deficiency   . Anemia, unspecified   . Coronary atherosclerosis of native coronary artery   . Acute systolic heart failure   . Long term (current) use of anticoagulants   . Primary pulmonary hypertension   . Cardiomyopathy in other diseases classified elsewhere   . Other seborrheic dermatitis   . Other and unspecified angina pectoris   . Other and unspecified hyperlipidemia   . Slowing of urinary stream   . Type II or unspecified type diabetes mellitus without mention of complication, not stated as uncontrolled   . Unspecified essential hypertension   . Encounter for long-term (current) use of other medications   . Esophageal reflux   . Edema   . Shortness of breath   . Hydrocele, unspecified   . Impotence of organic origin    Past Surgical History  Procedure Laterality Date  . Cholecystectomy    . Placement of a veriflex bare-metal sten    . Coronary angioplasty with stent placement      VeriFLEX bare-metal stent   Social History:   reports that he quit smoking about 14 years ago. His smoking use included Cigarettes. He has a 50 pack-year smoking history. He has never used smokeless tobacco. He reports  that he drinks about 8.4 ounces of alcohol per week. He reports that he does not use illicit drugs.  Family History  Problem Relation Age of Onset  . Diabetes Mother   . Stroke Father   . Cancer Sister     Medications: Patient's Medications  New Prescriptions   No medications on file  Previous Medications   AMIODARONE (PACERONE) 200 MG TABLET    Take 0.5 tablets (100 mg total) by mouth daily.   ASPIRIN EC 81 MG TABLET    Take 81 mg by mouth daily.   DONEPEZIL (ARICEPT) 5 MG TABLET    Take 5 mg by mouth at bedtime.   FUROSEMIDE (LASIX) 20 MG TABLET    Take 20 mg by mouth 3 (three) times daily.    HYDRALAZINE (APRESOLINE) 25 MG TABLET    Take 0.5 tablets (12.5 mg total) by mouth 3 (three) times  daily.   HYDROCODONE-ACETAMINOPHEN (NORCO/VICODIN) 5-325 MG PER TABLET    Take 1 tablet by mouth every 6 (six) hours as needed for pain.   INSULIN GLARGINE (LANTUS) 100 UNIT/ML INJECTION    Inject 5 Units into the skin at bedtime.   ISOSORBIDE MONONITRATE (IMDUR) 30 MG 24 HR TABLET    Take 1 tablet (30 mg total) by mouth daily.   LEVOTHYROXINE (SYNTHROID, LEVOTHROID) 50 MCG TABLET    Take 1 tablet (50 mcg total) by mouth daily.   NITROGLYCERIN (NITROSTAT) 0.4 MG SL TABLET    Place 0.4 mg under the tongue every 5 (five) minutes as needed for chest pain.    TRAMADOL (ULTRAM) 50 MG TABLET    Take 1 tablet (50 mg total) by mouth every 6 (six) hours as needed for pain.  Modified Medications   No medications on file  Discontinued Medications   No medications on file     Physical Exam:  Filed Vitals:   02/12/13 1555  BP: 134/78  Pulse: 76  Temp: 98 F (36.7 C)  TempSrc: Oral  Resp: 14  Height: 5\' 10"  (1.778 m)  Weight: 191 lb (86.637 kg)  SpO2: 97%   Physical Exam  Constitutional: He is well-developed, well-nourished, and in no distress. No distress.  Eyes: Conjunctivae and EOM are normal. Pupils are equal, round, and reactive to light.  Neck: Normal range of motion. Neck supple.  Cardiovascular: Normal rate, regular rhythm and normal heart sounds.   Pulmonary/Chest: Effort normal and breath sounds normal. No respiratory distress.  Abdominal: Soft. Bowel sounds are normal. He exhibits no distension. There is no tenderness.  Musculoskeletal: He exhibits edema (+1 to LE). He exhibits no tenderness.  Currently in a WC with increase pain with movement   Neurological: He is alert.  Skin: Skin is warm and dry. He is not diaphoretic.  Psychiatric: Affect and judgment normal.     Labs reviewed: Basic Metabolic Panel:  Recent Labs  81/19/14 1655  12/11/12 0530 02/01/13 1126 02/06/13 1655  NA  --   < > 134* 139 137  K  --   < > 3.5 3.9 3.9  CL  --   < > 97 98 100  CO2  --   < >  24 26 29   GLUCOSE  --   < > 110* 106* 118*  BUN  --   < > 41* 22 24*  CREATININE 1.89*  < > 2.22* 1.98* 1.94*  CALCIUM  --   < > 8.8 9.1 9.5  TSH 4.213  --   --   --   --   < > =  values in this interval not displayed. Liver Function Tests:  Recent Labs  12/06/12 1032 12/06/12 1655 12/07/12 0625  AST 35 33 29  ALT 22 21 20   ALKPHOS 80 72 67  BILITOT 1.5* 1.6* 1.5*  PROT 6.5 6.0 5.6*  ALBUMIN 3.2* 2.9* 2.7*   No results found for this basename: LIPASE, AMYLASE,  in the last 8760 hours  Recent Labs  12/06/12 1701  AMMONIA 20   CBC:  Recent Labs  12/06/12 1032 12/06/12 1655 12/07/12 0625 02/06/13 1655  WBC 10.1 9.6 7.4 8.1  NEUTROABS 7.5  --   --   --   HGB 11.0* 11.2* 10.3* 12.7*  HCT 33.1* 32.5* 30.3* 37.5*  MCV 91.4 91.8 91.3 92.8  PLT 166 155 144* 164     Assessment/Plan   1.   Edema- improved with 20 mg dose of lasix- will cont this and get BMP   2.   Memory loss 780.93   - taking aricept 5 mg qhs without any adverse effects at this time.   3.   Hypothyroid 244.9   - to get lab work   4.   Hip pain,right - pain controlled on current medications to cont tramadol     Pt is clear he does not want surgery- therefore surgical forms were not filled out   5.   Chronic systolic heart failure stable- will cont lasix at current dose to help with LE edema Lengthy discussion done with pt and family regarding plan of care for pt. Pt is his own POA and with his increase in medical conditions and dinimishing memory it is strongly suggestion for him to appoint a healthcare POA

## 2013-02-13 LAB — BASIC METABOLIC PANEL
BUN: 22 mg/dL (ref 8–27)
Calcium: 9.5 mg/dL (ref 8.6–10.2)
Creatinine, Ser: 1.98 mg/dL — ABNORMAL HIGH (ref 0.76–1.27)
GFR calc Af Amer: 36 mL/min/{1.73_m2} — ABNORMAL LOW (ref >59)
GFR calc non Af Amer: 31 mL/min/{1.73_m2} — ABNORMAL LOW (ref >59)
Glucose: 166 mg/dL — ABNORMAL HIGH (ref 65–99)

## 2013-02-15 ENCOUNTER — Telehealth: Payer: Self-pay | Admitting: *Deleted

## 2013-02-15 NOTE — Telephone Encounter (Signed)
Received request from Dr. Sherlean Foot for surgical clearance for major orthopaedic procedure.  Pt will need office visit prior to being cleared. I spoke with pt's niece who reports pt needs a half hip replacement. She will look at her schedule and call our office to schedule appt. She is aware appt can be with Dr. Clifton James, NP or PA.

## 2013-02-15 NOTE — Telephone Encounter (Signed)
[facility]inda [facility] stated they would like to extend patient for PT for two more visits. He also stated that he will let patient go if he has not met his goals, patient is progressing slowly

## 2013-02-15 NOTE — Telephone Encounter (Signed)
Appt has been scheduled for June 25,2014 with Norma Fredrickson, NP

## 2013-02-19 ENCOUNTER — Other Ambulatory Visit: Payer: Self-pay | Admitting: Geriatric Medicine

## 2013-02-19 MED ORDER — ISOSORBIDE MONONITRATE ER 30 MG PO TB24: 30.0000 mg | ORAL_TABLET | Freq: Every day | ORAL | Status: DC

## 2013-02-20 ENCOUNTER — Other Ambulatory Visit: Payer: Self-pay | Admitting: *Deleted

## 2013-02-20 MED ORDER — ISOSORBIDE MONONITRATE ER 30 MG PO TB24: 30.0000 mg | ORAL_TABLET | Freq: Every day | ORAL | Status: DC

## 2013-02-21 ENCOUNTER — Other Ambulatory Visit: Payer: Self-pay | Admitting: Geriatric Medicine

## 2013-02-21 MED ORDER — ISOSORBIDE MONONITRATE ER 30 MG PO TB24: 30.0000 mg | ORAL_TABLET | Freq: Every day | ORAL | Status: DC

## 2013-02-26 ENCOUNTER — Encounter: Payer: Self-pay | Admitting: *Deleted

## 2013-02-26 ENCOUNTER — Other Ambulatory Visit: Payer: Self-pay | Admitting: *Deleted

## 2013-02-26 MED ORDER — HYDRALAZINE HCL 25 MG PO TABS: 12.5000 mg | ORAL_TABLET | Freq: Three times a day (TID) | ORAL | Status: DC

## 2013-02-28 ENCOUNTER — Ambulatory Visit: Payer: Medicare Other | Admitting: Nurse Practitioner

## 2013-02-28 ENCOUNTER — Other Ambulatory Visit: Payer: Self-pay | Admitting: *Deleted

## 2013-02-28 ENCOUNTER — Ambulatory Visit (INDEPENDENT_AMBULATORY_CARE_PROVIDER_SITE_OTHER): Payer: Medicare Other | Admitting: Internal Medicine

## 2013-02-28 ENCOUNTER — Encounter: Payer: Self-pay | Admitting: Internal Medicine

## 2013-02-28 VITALS — BP 142/68 | HR 70 | Temp 98.0°F | Resp 18 | Wt 190.4 lb

## 2013-02-28 DIAGNOSIS — E119 Type 2 diabetes mellitus without complications: Secondary | ICD-10-CM

## 2013-02-28 DIAGNOSIS — E039 Hypothyroidism, unspecified: Secondary | ICD-10-CM

## 2013-02-28 DIAGNOSIS — D509 Iron deficiency anemia, unspecified: Secondary | ICD-10-CM

## 2013-02-28 DIAGNOSIS — R5381 Other malaise: Secondary | ICD-10-CM

## 2013-02-28 DIAGNOSIS — R413 Other amnesia: Secondary | ICD-10-CM

## 2013-02-28 DIAGNOSIS — I10 Essential (primary) hypertension: Secondary | ICD-10-CM

## 2013-02-28 MED ORDER — HYDROCODONE-ACETAMINOPHEN 5-325 MG PO TABS: 1.0000 | ORAL_TABLET | Freq: Four times a day (QID) | ORAL | Status: DC | PRN

## 2013-02-28 MED ORDER — NITROGLYCERIN 0.4 MG SL SUBL: 0.4000 mg | SUBLINGUAL_TABLET | SUBLINGUAL | Status: AC | PRN

## 2013-02-28 NOTE — Progress Notes (Signed)
Pass clock drawing 

## 2013-02-28 NOTE — Patient Instructions (Signed)
Continue medication as listed. 

## 2013-02-28 NOTE — Progress Notes (Signed)
Subjective:    Patient ID: Joel Walker, male    DOB: 18-Sep-1929, 79 y.o.   MRN: [id number]  HPI Unsteady when standing. Impulsive.   Lump in the right side. Consistent with lipoma.  Sister would like to release the sitters who stay with him.  Current Outpatient Prescriptions on File Prior to Visit  Medication Sig Dispense Refill  . amiodarone (PACERONE) 200 MG tablet Take 0.5 tablets (100 mg total) by mouth daily.      Marland Kitchen aspirin EC 81 MG tablet Take 81 mg by mouth daily.      . hydrALAZINE (APRESOLINE) 25 MG tablet Take 0.5 tablets (12.5 mg total) by mouth 3 (three) times daily.  45 tablet  6  . insulin glargine (LANTUS) 100 UNIT/ML injection Inject 5 Units into the skin at bedtime.      . isosorbide mononitrate (IMDUR) 30 MG 24 hr tablet Take 1 tablet (30 mg total) by mouth daily.  30 tablet  3  . levothyroxine (SYNTHROID, LEVOTHROID) 50 MCG tablet Take 1 tablet (50 mcg total) by mouth daily.  90 tablet  3  . traMADol (ULTRAM) 50 MG tablet Take 1 tablet (50 mg total) by mouth every 6 (six) hours as needed for pain.  90 tablet  3   No current facility-administered medications on file prior to visit.    Review of Systems  Constitutional: Positive for appetite change and fatigue.  HENT: Positive for hearing loss.   Eyes: Negative.   Respiratory: Negative for cough, chest tightness and shortness of breath.   Cardiovascular: Positive for leg swelling. Negative for chest pain and palpitations.  Gastrointestinal: Negative.   Endocrine: Negative.   Genitourinary: Negative for dysuria, urgency, decreased urine volume and enuresis.  Musculoskeletal: Positive for arthralgias.  Neurological: Positive for weakness. Negative for dizziness, tremors, seizures, syncope, speech difficulty and numbness.  Hematological: Negative.   Psychiatric/Behavioral: Positive for behavioral problems, confusion and agitation.       Impulsive. Independent nature, argumentative. Patient has mild to  moderate dementia       Objective:BP 142/68  Pulse 70  Temp(Src) 98 F (36.7 C)  Resp 18  Wt 190 lb 6.4 oz (86.365 kg)  BMI 27.32 kg/m2    Physical Exam  Constitutional: No distress.  Lost weight.  Eyes:  Corrective lenses  Neck: Normal range of motion. Neck supple. No JVD present. No tracheal deviation present. No thyromegaly present.  Cardiovascular: Normal rate, regular rhythm, normal heart sounds and intact distal pulses.  Exam reveals no friction rub.   No murmur heard. Pulmonary/Chest: Effort normal and breath sounds normal. No respiratory distress. He has no wheezes. He has no rales. He exhibits no tenderness.  Abdominal: Soft. Bowel sounds are normal. He exhibits no distension and no mass. There is no tenderness.  Musculoskeletal: He exhibits edema. He exhibits no tenderness.  Lymphadenopathy:    He has no cervical adenopathy.  Neurological: He is alert. No cranial nerve deficit. Coordination abnormal.  Very unsteady when standing.  Skin: Skin is warm and dry. No rash noted. No erythema. No pallor.  Lipoma right lateral chest wall.  Psychiatric:  Argumentative. Impulsive. Mild to moderate dementia.   MMSE 25/30.     Assessment & Plan:  DM: Controlled  ANEMIA, IRON DEFICIENCY:: Needs lab followup   - Plan: CBC With differential/Platelet  Memory loss: Mild to moderate dementia  HYPERTENSION: Controlled  Debility: Unable to stand stably. He thinks he can do more for himself that he has demonstrated. Onset saline.  Diabetes mellitus - Plan: CMP  Hypothyroid : Controlled- Plan: TSH

## 2013-03-05 ENCOUNTER — Ambulatory Visit (INDEPENDENT_AMBULATORY_CARE_PROVIDER_SITE_OTHER): Payer: Medicare Other | Admitting: Cardiovascular Disease

## 2013-03-05 ENCOUNTER — Encounter: Payer: Self-pay | Admitting: Cardiovascular Disease

## 2013-03-05 ENCOUNTER — Other Ambulatory Visit: Payer: Medicare Other

## 2013-03-05 VITALS — BP 130/70 | HR 61 | Ht 70.0 in | Wt 187.8 lb

## 2013-03-05 DIAGNOSIS — I2589 Other forms of chronic ischemic heart disease: Secondary | ICD-10-CM

## 2013-03-05 DIAGNOSIS — I4891 Unspecified atrial fibrillation: Secondary | ICD-10-CM

## 2013-03-05 DIAGNOSIS — Z01810 Encounter for preprocedural cardiovascular examination: Secondary | ICD-10-CM

## 2013-03-05 DIAGNOSIS — I255 Ischemic cardiomyopathy: Secondary | ICD-10-CM

## 2013-03-05 DIAGNOSIS — I2510 Atherosclerotic heart disease of native coronary artery without angina pectoris: Secondary | ICD-10-CM

## 2013-03-05 NOTE — Progress Notes (Signed)
History of Present Illness: 79 yo WM with history of CAD, chronic systolic CHF, ischemic CM, LV apical thrombus, DVT, PAF here today for cardiac follow up. I have not seen him since August 2013. He was admitted to [facility] in September 2009 with a NSTEMI and was found to have severe LV dysfunction with EF of 20%, non-viable myocardium in anterior and inferior walls with high degree stenosis in a Ramus Intermediate branch and viability in the lateral wall now s/p bare metal stent to the Ramus Intermediate artery. He was also found to have an LV apical thrombus and chronic DVT in right lower extremity for which he was placed on coumadin for anticoagulation. He is also known to have paroxysmal atrial fibrillation. His coumadin was stopped in May 2012 because of non-compliance with his medications and non-compliance with f/u visits for INR. He refused to take ASA because of prior issues with ASA. He has not been on ASA or coumadin since then.   He is here today for follow up. He had no dizziness or vertigo. Pt has continued gait instability with frequent falls. He has weakness and generalized fatigue. His sister and neighbor are with him today. He tells me that he has been feeling well. He refuses to use a wheelchair at home but has been using a walker. Only falls when he forgets walker. He has fallen three times in the last three months. He was in [facility] in April 2014 and went to [place] for rehab. He has a right hip fracture. There are plans for possible right hip surgery. His right hip is painful. No chest pain. He does have dyspnea with minimal exertion.   Primary Care Physician: Murray Hodgkins   Last Lipid Profile: Followed in primary care.  Past Medical History  Diagnosis Date  . SYSTOLIC HEART FAILURE, ACUTE   . PULMONARY HYPERTENSION   . PAROXYSMAL ATRIAL FIBRILLATION   . HYPERTENSION   . DVT   . CARDIOMYOPATHY   . CAD, NATIVE VESSEL   . TOBACCO USE, QUIT   . RENAL DISEASE,  CHRONIC, STAGE III   . MURAL THROMBUS, APEX OF HEART   . Long term current use of anticoagulant   . HYPOKALEMIA   . DM   . ANEMIA, IRON DEFICIENCY   . CHF (congestive heart failure)   . Senile cataract, unspecified   . Other specified disease of nail   . Unspecified hearing loss   . Chronic kidney disease, stage II (mild)   . Memory loss   . Unspecified urinary incontinence   . Abnormality of gait   . Unspecified hypothyroidism   . Unspecified vitamin D deficiency   . Anemia, unspecified   . Coronary atherosclerosis of native coronary artery   . Acute systolic heart failure   . Primary pulmonary hypertension   . Cardiomyopathy in other diseases classified elsewhere   . Other seborrheic dermatitis   . Other and unspecified angina pectoris   . Other and unspecified hyperlipidemia   . Slowing of urinary stream   . Type II or unspecified type diabetes mellitus without mention of complication, not stated as uncontrolled   . Unspecified essential hypertension   . Esophageal reflux   . Edema   . Shortness of breath   . Hydrocele, unspecified   . Impotence of organic origin   . Edema     Past Surgical History  Procedure Laterality Date  . Cholecystectomy  1985  . Placement of a veriflex bare-metal sten    .  Coronary angioplasty with stent placement      VeriFLEX bare-metal stent    Current Outpatient Prescriptions  Medication Sig Dispense Refill  . amiodarone (PACERONE) 200 MG tablet Take 0.5 tablets (100 mg total) by mouth daily.      Marland Kitchen aspirin EC 81 MG tablet Take 81 mg by mouth daily.      Marland Kitchen donepezil (ARICEPT) 5 MG tablet Take 5 mg by mouth at bedtime.      . furosemide (LASIX) 20 MG tablet Take 20 mg by mouth 3 (three) times daily. 60 MG      . hydrALAZINE (APRESOLINE) 25 MG tablet Take 0.5 tablets (12.5 mg total) by mouth 3 (three) times daily.  45 tablet  6  . HYDROcodone-acetaminophen (NORCO/VICODIN) 5-325 MG per tablet Take 1 tablet by mouth every 6 (six) hours as  needed for pain.  120 tablet  5  . insulin glargine (LANTUS) 100 UNIT/ML injection Inject 5 Units into the skin at bedtime.      . isosorbide mononitrate (IMDUR) 30 MG 24 hr tablet Take 1 tablet (30 mg total) by mouth daily.  30 tablet  3  . levothyroxine (SYNTHROID, LEVOTHROID) 50 MCG tablet Take 1 tablet (50 mcg total) by mouth daily.  90 tablet  3  . nitroGLYCERIN (NITROSTAT) 0.4 MG SL tablet Place 1 tablet (0.4 mg total) under the tongue every 5 (five) minutes as needed for chest pain.  100 tablet  5  . traMADol (ULTRAM) 50 MG tablet Take 1 tablet (50 mg total) by mouth every 6 (six) hours as needed for pain.  90 tablet  3   No current facility-administered medications for this visit.    Allergies  Allergen Reactions  . Lisinopril     REACTION: Cough. Unsure if this is a true allergy. Pt takes this med everyday with out any known side effects per family.    History   Social History  . Marital Status: Single    Spouse Name: N/A    Number of Children: N/A  . Years of Education: N/A   Occupational History  . Not on file.   Social History Main Topics  . Smoking status: Former Smoker -- 1.00 packs/day for 50 years    Types: Cigarettes    Quit date: 09/06/1998  . Smokeless tobacco: Never Used     Comment: 12/06/2012 "smoked from 1950 to 2000"  . Alcohol Use: 8.4 oz/week    14 Cans of beer per week     Comment: 12/06/2012 "2-3 beers/day; have whiskey q now and then"  . Drug Use: No  . Sexually Active: No   Other Topics Concern  . Not on file   Social History Narrative   Lives in [place] with his girlfriend who he has been    with for over 40 years.  He has a 100 pack-year smoking history but quit  in 1996.  He denies the use of alcohol or the use of illicit drugs.          Family History  Problem Relation Age of Onset  . Diabetes Mother   . Stroke Father   . Cancer Sister     Review of Systems:  As stated in the HPI and otherwise negative.   BP 130/70  Pulse 61   Ht 5\' 10"  (1.778 m)  Wt 187 lb 12.8 oz (85.186 kg)  BMI 26.95 kg/m2  SpO2 96%  Physical Examination: General: Well developed, well nourished, NAD HEENT: OP clear, mucus membranes moist  SKIN: warm, dry. No rashes. Neuro: No focal deficits Musculoskeletal: Muscle strength 5/5 all ext Psychiatric: Mood and affect normal Neck: No JVD, no carotid bruits, no thyromegaly, no lymphadenopathy. Lungs:Clear bilaterally, no wheezes, rhonci, crackles Cardiovascular: Regular rate and rhythm. No murmurs, gallops or rubs. Abdomen:Soft. Bowel sounds present. Non-tender.  Extremities: 1+ bilateral lower extremity edema.    Assessment and Plan:   1. CAD, NATIVE VESSEL: He is known to have severe CAD as described in HPI. He has restarted his ASA. Will continue conservative management.   2. CARDIOMYOPATHY, ISCHEMIC: Volume status is ok. He still has some LE edema but weight stable. Continue current dose of Lasix. Continue medical therapy with beta blocker and Ace-inh. He refuses consideration of an ICD.   3. Atrial fibrillation: Maintaining NSR. Will continue amiodarone 100 mg po Qdaily. He has been off of coumadin secondary to fall risk and non-compliance. No changes.   4. Pre-operative risk assessment: He is at high risk for any surgical procedure involving general anesthesia given his severe LV dysfunction, severe CAD. He would be at high cardiac risk for a right hip surgery. Stress testing is not indicated given the extent of his CAD and no good options for revascularization.   5. Social: He has 24 hour care at home. Continues to decline cognitively and physically. May need to consider long term placement.

## 2013-03-05 NOTE — Patient Instructions (Addendum)
Your physician wants you to follow-up in:  6 months. You will receive a reminder letter in the mail two months in advance. If you don't receive a letter, please call our office to schedule the follow-up appointment.   

## 2013-03-27 ENCOUNTER — Other Ambulatory Visit: Payer: Self-pay | Admitting: Cardiovascular Disease

## 2013-03-27 ENCOUNTER — Other Ambulatory Visit: Payer: Self-pay | Admitting: Geriatric Medicine

## 2013-03-27 ENCOUNTER — Other Ambulatory Visit: Payer: Self-pay | Admitting: Internal Medicine

## 2013-03-27 MED ORDER — DONEPEZIL HCL 5 MG PO TABS: 5.0000 mg | ORAL_TABLET | Freq: Every day | ORAL | Status: DC

## 2013-03-28 ENCOUNTER — Other Ambulatory Visit: Payer: Self-pay | Admitting: Cardiovascular Disease

## 2013-03-28 NOTE — Telephone Encounter (Signed)
Refill request for KLOR-CON from [facility] came in but it was D/C 12/06/12 - "inpatient standard", D/C again on 12/11/12 - "Stop taking at Discharge". Unable to reach pt. Please review and make decision on refill.

## 2013-04-05 ENCOUNTER — Other Ambulatory Visit: Payer: Self-pay | Admitting: Geriatric Medicine

## 2013-04-05 MED ORDER — FUROSEMIDE 20 MG PO TABS: 20.0000 mg | ORAL_TABLET | Freq: Three times a day (TID) | ORAL | Status: DC

## 2013-04-10 ENCOUNTER — Encounter: Payer: Self-pay | Admitting: Internal Medicine

## 2013-04-10 ENCOUNTER — Ambulatory Visit (INDEPENDENT_AMBULATORY_CARE_PROVIDER_SITE_OTHER): Payer: Medicare Other | Admitting: Internal Medicine

## 2013-04-10 VITALS — BP 160/72 | HR 76 | Temp 98.5°F | Resp 18 | Wt 186.6 lb

## 2013-04-10 DIAGNOSIS — L89309 Pressure ulcer of unspecified buttock, unspecified stage: Secondary | ICD-10-CM

## 2013-04-10 DIAGNOSIS — I10 Essential (primary) hypertension: Secondary | ICD-10-CM

## 2013-04-10 DIAGNOSIS — I4891 Unspecified atrial fibrillation: Secondary | ICD-10-CM

## 2013-04-10 DIAGNOSIS — I509 Heart failure, unspecified: Secondary | ICD-10-CM

## 2013-04-10 DIAGNOSIS — I480 Paroxysmal atrial fibrillation: Secondary | ICD-10-CM

## 2013-04-10 DIAGNOSIS — E039 Hypothyroidism, unspecified: Secondary | ICD-10-CM

## 2013-04-10 DIAGNOSIS — M25551 Pain in right hip: Secondary | ICD-10-CM

## 2013-04-10 DIAGNOSIS — M25559 Pain in unspecified hip: Secondary | ICD-10-CM

## 2013-04-10 DIAGNOSIS — R413 Other amnesia: Secondary | ICD-10-CM

## 2013-04-10 MED ORDER — HYDROCODONE-ACETAMINOPHEN 5-325 MG PO TABS: 1.0000 | ORAL_TABLET | Freq: Four times a day (QID) | ORAL | Status: DC | PRN

## 2013-04-10 NOTE — Patient Instructions (Addendum)
Try Aquaphor to the sacral ulcer. Obtain cushion to sit on.

## 2013-04-11 LAB — TSH: TSH: 2.47 u[IU]/mL (ref 0.450–4.500)

## 2013-04-11 MED ORDER — FUROSEMIDE 40 MG PO TABS: ORAL_TABLET | ORAL | Status: DC

## 2013-04-11 NOTE — Progress Notes (Signed)
Subjective:    Patient ID: Joel Walker, male    DOB: 05-13-1930, 79 y.o.   MRN: [id number]  HPI Continues with pain and swelling in the right leg. He is not a good surgical candidate. Pain impairs his ability to care for himself.  Sits so long in recliner and chair that he has a sacral decubitus and blisters on his buttocks. He is wearing incontinence briefs and getting A&D ointment on the open area.  He has assistance at home much of the time. He is impulsive and a HIGH risk for falling. Has already fallen several times at home.  Memory is impaired. He generally is agreeable and is not a behioral challenge, but there are times he will abruptly change and get angry.  Current Outpatient Prescriptions on File Prior to Visit  Medication Sig Dispense Refill  . amiodarone (PACERONE) 200 MG tablet Take 0.5 tablets (100 mg total) by mouth daily.      Marland Kitchen amiodarone (PACERONE) 200 MG tablet TAKE 1 TABLET BY MOUTH EVERY DAY  30 tablet  3  . amiodarone (PACERONE) 200 MG tablet TAKE 1 TABLET BY MOUTH EVERY DAY  30 tablet  3  . aspirin EC 81 MG tablet Take 81 mg by mouth daily.      Marland Kitchen donepezil (ARICEPT) 5 MG tablet Take 1 tablet (5 mg total) by mouth at bedtime.  30 tablet  3  . furosemide (LASIX) 20 MG tablet Take 1 tablet (20 mg total) by mouth 3 (three) times daily. 60 MG  90 tablet  3  . furosemide (LASIX) 40 MG tablet TAKE 1 TABLET BY MOUTH DAILY TO CONTROL EDEMA  30 tablet  5  . hydrALAZINE (APRESOLINE) 25 MG tablet Take 0.5 tablets (12.5 mg total) by mouth 3 (three) times daily.  45 tablet  6  . insulin glargine (LANTUS) 100 UNIT/ML injection Inject 5 Units into the skin at bedtime.      . isosorbide mononitrate (IMDUR) 30 MG 24 hr tablet Take 1 tablet (30 mg total) by mouth daily.  30 tablet  3  . levothyroxine (SYNTHROID, LEVOTHROID) 50 MCG tablet Take 1 tablet (50 mcg total) by mouth daily.  90 tablet  3  . lisinopril (PRINIVIL,ZESTRIL) 5 MG tablet TAKE 1 TABLET BY MOUTH EVERY DAY  30  tablet  5  . nitroGLYCERIN (NITROSTAT) 0.4 MG SL tablet Place 1 tablet (0.4 mg total) under the tongue every 5 (five) minutes as needed for chest pain.  100 tablet  5  . traMADol (ULTRAM) 50 MG tablet Take 1 tablet (50 mg total) by mouth every 6 (six) hours as needed for pain.  90 tablet  3   No current facility-administered medications on file prior to visit.     Review of Systems  Constitutional: Positive for appetite change and fatigue.  HENT: Positive for hearing loss.   Eyes: Negative.   Respiratory: Negative for cough, chest tightness and shortness of breath.   Cardiovascular: Positive for leg swelling. Negative for chest pain and palpitations.  Gastrointestinal: Negative.   Endocrine: Negative.   Genitourinary: Negative for dysuria, urgency, decreased urine volume and enuresis.  Musculoskeletal: Positive for arthralgias.  Skin:       Previously blistered areas on both buttocks near the intergluteal crease are healing.  Neurological: Positive for weakness. Negative for dizziness, tremors, seizures, syncope, speech difficulty and numbness.  Hematological: Negative.   Psychiatric/Behavioral: Positive for confusion and agitation.       Impulsive. Independent nature, bt argumentative.  Objective:BP 160/72  Pulse 76  Temp(Src) 98.5 F (36.9 C) (Oral)  Resp 18  Wt 186 lb 9.6 oz (84.641 kg)  BMI 26.77 kg/m2  SpO2 95%    Physical Exam  Constitutional: No distress.  Lost weight.  Eyes:  Corrective lenses  Neck: Normal range of motion. Neck supple. No JVD present. No tracheal deviation present. No thyromegaly present.  Cardiovascular: Normal rate, regular rhythm, normal heart sounds and intact distal pulses.  Exam reveals no friction rub.   No murmur heard. Pulmonary/Chest: Effort normal and breath sounds normal. No respiratory distress. He has no wheezes. He has no rales. He exhibits no tenderness.  Abdominal: Soft. Bowel sounds are normal. He exhibits no distension and  no mass. There is no tenderness.  Musculoskeletal: He exhibits edema. He exhibits no tenderness.  Lymphadenopathy:    He has no cervical adenopathy.  Neurological: He is alert. No cranial nerve deficit. Coordination abnormal.  Very unsteady when standing.  Skin: Skin is warm and dry. No rash noted. No erythema. No pallor.  Lipoma right lateral chest wall. Healing areas of the buttocks near the intergluteal crease. Previously blistered.  Psychiatric:  Argumentative. Impulsive.           Assessment & Plan:  Pressure ulcer, buttock(707.05): main treatment is pressure relief. Advised to do less sitting. Acquire pressure relief pad to use at all times he is in recliner or chair. Use Aquaphor or A&D ointment regularly to protect buttocks.  HYPERTENSION: Modest elevation in SBP. No chang in meds needed  Hypothyroid - Plan: TSH  Hip pain, right: not a good surgical candidate   Plan: HYDROcodone-acetaminophen (NORCO/VICODIN) 5-325 MG per tablet  Memory loss: unchanged. He is also impulsive.  Congestive heart failure, unspecified - Plan: furosemide (LASIX) 40 MG tablet  Paroxysmal atrial fibrillation: on amiodarone. Needs TSH rechecked.

## 2013-04-20 ENCOUNTER — Other Ambulatory Visit: Payer: Self-pay | Admitting: *Deleted

## 2013-04-20 MED ORDER — INSULIN PEN NEEDLE 31G X 8 MM MISC: Status: DC

## 2013-04-25 ENCOUNTER — Encounter: Payer: Self-pay | Admitting: Internal Medicine

## 2013-04-25 ENCOUNTER — Ambulatory Visit (INDEPENDENT_AMBULATORY_CARE_PROVIDER_SITE_OTHER): Payer: Medicare Other | Admitting: Internal Medicine

## 2013-04-25 VITALS — BP 136/80 | HR 80 | Temp 98.2°F | Resp 16 | Ht 70.0 in | Wt 178.6 lb

## 2013-04-25 DIAGNOSIS — I10 Essential (primary) hypertension: Secondary | ICD-10-CM

## 2013-04-25 DIAGNOSIS — D509 Iron deficiency anemia, unspecified: Secondary | ICD-10-CM

## 2013-04-25 DIAGNOSIS — R12 Heartburn: Secondary | ICD-10-CM

## 2013-04-25 MED ORDER — OMEPRAZOLE 40 MG PO CPDR: 40.0000 mg | DELAYED_RELEASE_CAPSULE | Freq: Every day | ORAL | Status: DC

## 2013-04-25 NOTE — Patient Instructions (Signed)
Add omeprazole to your medication

## 2013-04-25 NOTE — Progress Notes (Signed)
Subjective:    Patient ID: Joel Walker, male    DOB: 1929-09-09, 79 y.o.   MRN: [id number]  HPI Having diarrhea and nausea. Can't eat like he used to . When he eats he gets nauseous. He stops eating after a bite of two.    Diarrhea up to 9 times. Stopped for a couple f days. Then resumed.  Stools were black yesterday. Hs used Pepto-bismol  Current Outpatient Prescriptions on File Prior to Visit  Medication Sig Dispense Refill  . amiodarone (PACERONE) 200 MG tablet Take 0.5 tablets (100 mg total) by mouth daily.      Marland Kitchen aspirin EC 81 MG tablet Take 81 mg by mouth daily.      Marland Kitchen donepezil (ARICEPT) 5 MG tablet Take 1 tablet (5 mg total) by mouth at bedtime.  30 tablet  3  . hydrALAZINE (APRESOLINE) 25 MG tablet Take 0.5 tablets (12.5 mg total) by mouth 3 (three) times daily.  45 tablet  6  . HYDROcodone-acetaminophen (NORCO/VICODIN) 5-325 MG per tablet Take 1 tablet by mouth every 6 (six) hours as needed for pain.  120 tablet  5  . insulin glargine (LANTUS) 100 UNIT/ML injection Inject 5 Units into the skin at bedtime.      . Insulin Pen Needle (B-D ULTRAFINE III SHORT PEN) 31G X 8 MM MISC Use as Directed. DX: 250.00  100 each  11  . isosorbide mononitrate (IMDUR) 30 MG 24 hr tablet Take 1 tablet (30 mg total) by mouth daily.  30 tablet  3  . levothyroxine (SYNTHROID, LEVOTHROID) 50 MCG tablet Take 1 tablet (50 mcg total) by mouth daily.  90 tablet  3  . lisinopril (PRINIVIL,ZESTRIL) 5 MG tablet TAKE 1 TABLET BY MOUTH EVERY DAY  30 tablet  5  . nitroGLYCERIN (NITROSTAT) 0.4 MG SL tablet Place 1 tablet (0.4 mg total) under the tongue every 5 (five) minutes as needed for chest pain.  100 tablet  5  . traMADol (ULTRAM) 50 MG tablet Take 1 tablet (50 mg total) by mouth every 6 (six) hours as needed for pain.  90 tablet  3   No current facility-administered medications on file prior to visit.    Review of Systems  Constitutional: Positive for appetite change and fatigue.  HENT: Positive  for hearing loss.   Eyes: Negative.   Respiratory: Negative for cough, chest tightness and shortness of breath.   Cardiovascular: Positive for leg swelling. Negative for chest pain and palpitations.  Gastrointestinal: Negative.   Endocrine: Negative.   Genitourinary: Negative for dysuria, urgency, decreased urine volume and enuresis.  Musculoskeletal: Positive for arthralgias.  Skin:       Previously blistered areas on both buttocks near the intergluteal crease are healing.  Neurological: Positive for weakness. Negative for dizziness, tremors, seizures, syncope, speech difficulty and numbness.  Hematological: Negative.   Psychiatric/Behavioral: Positive for confusion and agitation.       Impulsive. Independent nature, bt argumentative.       Objective:   Physical Exam  Constitutional: No distress.  Lost weight.  Eyes:  Corrective lenses  Neck: Normal range of motion. Neck supple. No JVD present. No tracheal deviation present. No thyromegaly present.  Cardiovascular: Normal rate, regular rhythm, normal heart sounds and intact distal pulses.  Exam reveals no friction rub.   No murmur heard. Pulmonary/Chest: Effort normal and breath sounds normal. No respiratory distress. He has no wheezes. He has no rales. He exhibits no tenderness.  Abdominal: Soft. Bowel sounds are normal.  He exhibits no distension and no mass. There is no tenderness.  Musculoskeletal: He exhibits edema. He exhibits no tenderness.  Lymphadenopathy:    He has no cervical adenopathy.  Neurological: He is alert. No cranial nerve deficit. Coordination abnormal.  Very unsteady when standing.  Skin: Skin is warm and dry. No rash noted. No erythema. No pallor.  Lipoma right lateral chest wall. Healing areas of the buttocks near the intergluteal crease. Previously blistered.  Psychiatric:  Argumentative. Impulsive.       Office Visit on 04/10/2013  Component Date Value Range Status  . TSH 04/10/2013 2.470  0.450 -  4.500 uIU/mL Final       Assessment & Plan:  ANEMIA, IRON DEFICIENCY - Plan: CBC With differential/Platelet  HYPERTENSION: controlled  Heartburn - Plan: omeprazole (PRILOSEC) 40 MG capsule  Sacral decubitus is better.

## 2013-04-25 NOTE — Addendum Note (Signed)
Addended by: Marvia Pickles on: 04/25/2013 01:06 PM   Modules accepted: Orders

## 2013-04-26 LAB — CBC WITH DIFFERENTIAL
Basos: 0 % (ref 0–3)
Eosinophils Absolute: 0.2 10*3/uL (ref 0.0–0.4)
HCT: 41.3 % (ref 37.5–51.0)
Hemoglobin: 13.8 g/dL (ref 12.6–17.7)
MCH: 31.7 pg (ref 26.6–33.0)
MCHC: 33.4 g/dL (ref 31.5–35.7)
MCV: 95 fL (ref 79–97)
Monocytes Absolute: 1 10*3/uL — ABNORMAL HIGH (ref 0.1–0.9)
Neutrophils Absolute: 6.3 10*3/uL (ref 1.4–7.0)

## 2013-05-16 ENCOUNTER — Other Ambulatory Visit: Payer: Self-pay | Admitting: Nurse Practitioner

## 2013-06-17 ENCOUNTER — Other Ambulatory Visit: Payer: Self-pay | Admitting: Internal Medicine

## 2013-06-17 ENCOUNTER — Other Ambulatory Visit: Payer: Self-pay | Admitting: Nurse Practitioner

## 2013-06-18 ENCOUNTER — Other Ambulatory Visit: Payer: Self-pay | Admitting: Internal Medicine

## 2013-06-19 ENCOUNTER — Ambulatory Visit (INDEPENDENT_AMBULATORY_CARE_PROVIDER_SITE_OTHER): Payer: Medicare Other | Admitting: Internal Medicine

## 2013-06-19 ENCOUNTER — Encounter: Payer: Self-pay | Admitting: Internal Medicine

## 2013-06-19 VITALS — BP 118/72 | HR 79 | Temp 98.1°F | Wt 163.8 lb

## 2013-06-19 DIAGNOSIS — M25551 Pain in right hip: Secondary | ICD-10-CM

## 2013-06-19 DIAGNOSIS — M7022 Olecranon bursitis, left elbow: Secondary | ICD-10-CM

## 2013-06-19 DIAGNOSIS — E119 Type 2 diabetes mellitus without complications: Secondary | ICD-10-CM

## 2013-06-19 DIAGNOSIS — I509 Heart failure, unspecified: Secondary | ICD-10-CM

## 2013-06-19 DIAGNOSIS — I428 Other cardiomyopathies: Secondary | ICD-10-CM

## 2013-06-19 DIAGNOSIS — M25559 Pain in unspecified hip: Secondary | ICD-10-CM

## 2013-06-19 DIAGNOSIS — R413 Other amnesia: Secondary | ICD-10-CM

## 2013-06-19 DIAGNOSIS — E039 Hypothyroidism, unspecified: Secondary | ICD-10-CM

## 2013-06-19 DIAGNOSIS — M7020 Olecranon bursitis, unspecified elbow: Secondary | ICD-10-CM | POA: Insufficient documentation

## 2013-06-19 DIAGNOSIS — R634 Abnormal weight loss: Secondary | ICD-10-CM

## 2013-06-19 DIAGNOSIS — I5022 Chronic systolic (congestive) heart failure: Secondary | ICD-10-CM

## 2013-06-19 DIAGNOSIS — I10 Essential (primary) hypertension: Secondary | ICD-10-CM

## 2013-06-19 MED ORDER — HYDROCODONE-ACETAMINOPHEN 5-325 MG PO TABS: ORAL_TABLET | ORAL | Status: DC

## 2013-06-19 MED ORDER — MIRTAZAPINE 15 MG PO TABS: ORAL_TABLET | ORAL | Status: DC

## 2013-06-19 MED ORDER — TRAMADOL HCL 50 MG PO TABS: 50.0000 mg | ORAL_TABLET | Freq: Four times a day (QID) | ORAL | Status: DC | PRN

## 2013-06-19 NOTE — Patient Instructions (Addendum)
Stop furosemide 20 mg at noon. Stop hydralazine. Start mirtazapine at bed to stimulate appetite.

## 2013-06-19 NOTE — Progress Notes (Signed)
Subjective:    Patient ID: Joel Walker, male    DOB: 12-12-1929, 79 y.o.   MRN: [id number]  Chief Complaint  Patient presents with  . Medical Managment of Chronic Issues    2 month f/u  . Joint Swelling    LT elbow     HPI  Hip pain, right ; persistent. Benefits from traMADol (ULTRAM) 50 MG tablet, and HYDROcodone-acetaminophen (NORCO/VICODIN) 5-325 MG per tablet  Chronic systolic heart failure: controlled  Diabetes mellitus:controlled  Congestive heart failure, unspecified.  Hypothyroid:controlled  HYPERTENSION:controlled  CARDIOMYOPATHY:unchanged  Memory loss:unchanged  Loss of weight: Losing weight. Not eating. Not hungry.  Bursitis, olecranon, left;painless fluid sac at the left elbow. present at least 2 months.  Continues to be a behavioral challenge to his caretaker. He is not stable enough medically or emotionally to stay alone. He is impulsive and demanding. He has no safety awareness.  Current Outpatient Prescriptions on File Prior to Visit  Medication Sig Dispense Refill  . amiodarone (PACERONE) 200 MG tablet Take 0.5 tablets (100 mg total) by mouth daily.      Marland Kitchen aspirin EC 81 MG tablet Take 81 mg by mouth daily.      Marland Kitchen donepezil (ARICEPT) 5 MG tablet Take 1 tablet (5 mg total) by mouth at bedtime.  30 tablet  3  . furosemide (LASIX) 40 MG tablet Take 40 mg by mouth daily. One daily to prevent fluid retention in the morning.      . insulin glargine (LANTUS) 100 UNIT/ML injection Inject 5 Units into the skin at bedtime.      . Insulin Pen Needle (B-D ULTRAFINE III SHORT PEN) 31G X 8 MM MISC Use as Directed. DX: 250.00  100 each  11  . isosorbide mononitrate (IMDUR) 30 MG 24 hr tablet TAKE 1 TABLET EVERY DAY  30 tablet  3  . levothyroxine (SYNTHROID, LEVOTHROID) 50 MCG tablet Take 1 tablet (50 mcg total) by mouth daily.  90 tablet  3  . nitroGLYCERIN (NITROSTAT) 0.4 MG SL tablet Place 1 tablet (0.4 mg total) under the tongue every 5 (five) minutes as  needed for chest pain.  100 tablet  5  . omeprazole (PRILOSEC) 40 MG capsule Take 1 capsule (40 mg total) by mouth daily.  30 capsule  3   No current facility-administered medications on file prior to visit.     Review of Systems  Constitutional: Positive for appetite change and fatigue.  HENT: Positive for hearing loss.   Eyes: Negative.   Respiratory: Negative for cough, chest tightness and shortness of breath.   Cardiovascular: Positive for leg swelling. Negative for chest pain and palpitations.  Gastrointestinal: Negative.   Endocrine: Negative.   Genitourinary: Negative for dysuria, urgency, decreased urine volume and enuresis.  Musculoskeletal: Positive for arthralgias.  Skin:       Previously blistered areas on both buttocks near the intergluteal crease are healing.  Neurological: Positive for weakness. Negative for dizziness, tremors, seizures, syncope, speech difficulty and numbness.  Hematological: Negative.   Psychiatric/Behavioral: Positive for behavioral problems, confusion and agitation.       Impulsive. Independent nature, bt argumentative.       Objective:BP 118/72  Pulse 79  Temp(Src) 98.1 F (36.7 C) (Oral)  Wt 163 lb 12.8 oz (74.299 kg)  BMI 23.5 kg/m2  SpO2 95%    Physical Exam  Constitutional: No distress.  Lost weight.  Eyes:  Corrective lenses  Neck: Normal range of motion. Neck supple. No JVD present. No tracheal  deviation present. No thyromegaly present.  Cardiovascular: Normal rate, regular rhythm, normal heart sounds and intact distal pulses.  Exam reveals no friction rub.   No murmur heard. Pulmonary/Chest: Effort normal and breath sounds normal. No respiratory distress. He has no wheezes. He has no rales. He exhibits no tenderness.  Abdominal: Soft. Bowel sounds are normal. He exhibits no distension and no mass. There is no tenderness.  Musculoskeletal: He exhibits edema. He exhibits no tenderness.  Large left olecranon bursal enlargement.  Painless. No inflammation.  Lymphadenopathy:    He has no cervical adenopathy.  Neurological: He is alert. No cranial nerve deficit. Coordination abnormal.  Very unsteady when standing.  Skin: Skin is warm and dry. No rash noted. No erythema. No pallor.  Lipoma right lateral chest wall. Healing areas of the buttocks near the intergluteal crease. Previously blistered.  Psychiatric:  Argumentative. Impulsive.      Office Visit on 04/25/2013  Component Date Value Range Status  . WBC 04/25/2013 9.5  3.4 - 10.8 x10E3/uL Final  . RBC 04/25/2013 4.36  4.14 - 5.80 x10E6/uL Final  . Hemoglobin 04/25/2013 13.8  12.6 - 17.7 g/dL Final  . HCT 82/95/6213 41.3  37.5 - 51.0 % Final  . MCV 04/25/2013 95  79 - 97 fL Final  . MCH 04/25/2013 31.7  26.6 - 33.0 pg Final  . MCHC 04/25/2013 33.4  31.5 - 35.7 g/dL Final  . RDW 08/65/7846 14.4  12.3 - 15.4 % Final  . Platelets 04/25/2013 227  150 - 379 x10E3/uL Final  . Neutrophils Relative % 04/25/2013 67  40 - 74 % Final  . Lymphs 04/25/2013 21  14 - 46 % Final  . Monocytes 04/25/2013 10  4 - 12 % Final  . Eos 04/25/2013 2  0 - 5 % Final  . Basos 04/25/2013 0  0 - 3 % Final  . Neutrophils Absolute 04/25/2013 6.3  1.4 - 7.0 x10E3/uL Final  . Lymphocytes Absolute 04/25/2013 2.0  0.7 - 3.1 x10E3/uL Final  . Monocytes Absolute 04/25/2013 1.0* 0.1 - 0.9 x10E3/uL Final  . Eosinophils Absolute 04/25/2013 0.2  0.0 - 0.4 x10E3/uL Final  . Basophils Absolute 04/25/2013 0.0  0.0 - 0.2 x10E3/uL Final  . Immature Granulocytes 04/25/2013 0  0 - 2 % Final  . Immature Grans (Abs) 04/25/2013 0.0  0.0 - 0.1 x10E3/uL Final  Office Visit on 04/10/2013  Component Date Value Range Status  . TSH 04/10/2013 2.470  0.450 - 4.500 uIU/mL Final        Assessment & Plan:  Hip pain, right - Plan: traMADol (ULTRAM) 50 MG tablet, HYDROcodone-acetaminophen (NORCO/VICODIN) 5-325 MG per tablet  Chronic systolic heart failure: stable  Diabetes mellitus: stable  Congestive  heart failure, unspecified: stable  Hypothyroid: stable  HYPERTENSION; improved. Discontinue hydralazine and reduce furosemide   CARDIOMYOPATHY: stable  Memory loss: unchanged  Loss of weight - Plan: mirtazapine (REMERON) 15 MG tablet  Bursitis, olecranon, left: observe.

## 2013-06-20 ENCOUNTER — Telehealth: Payer: Self-pay | Admitting: *Deleted
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
## 2013-06-27 ENCOUNTER — Ambulatory Visit (INDEPENDENT_AMBULATORY_CARE_PROVIDER_SITE_OTHER): Payer: Medicare Other | Admitting: Nurse Practitioner

## 2013-06-27 ENCOUNTER — Encounter: Payer: Self-pay | Admitting: Nurse Practitioner

## 2013-06-27 VITALS — BP 104/68 | HR 93 | Temp 98.1°F | Wt 163.0 lb

## 2013-06-27 DIAGNOSIS — M7022 Olecranon bursitis, left elbow: Secondary | ICD-10-CM

## 2013-06-27 DIAGNOSIS — M7020 Olecranon bursitis, unspecified elbow: Secondary | ICD-10-CM

## 2013-06-27 MED ORDER — DOXYCYCLINE HYCLATE 100 MG PO TABS: 100.0000 mg | ORAL_TABLET | Freq: Two times a day (BID) | ORAL | Status: DC

## 2013-06-27 NOTE — Patient Instructions (Addendum)
TED hose daily- place on in the morning and then take off in evening Elevate legs to help with edema  Will give antibiotic for elbow-doxycycline twice daily for 10 days  And sent you to orthopedics due to elbow

## 2013-06-27 NOTE — Progress Notes (Signed)
Patient ID: Joel Walker, male   DOB: 15-Jun-1930, 79 y.o.   MRN: [id number]   Allergies  Allergen Reactions  . Lisinopril     REACTION: Cough. Unsure if this is a true allergy. Pt takes this med everyday with out any known side effects per family.    Chief Complaint  Patient presents with  . Acute Visit    feet & LT elbow swelling/pain     HPI: Patient is a 79 y.o. male seen in the office today for left elbow swelling  with fluid in it and pain This has gotten worse since last visit; now elbow is tight and red. No fevers or chills. No numbness or tingling. Still able to move elbow and arm without difficulty just causes pain  And feet have been swelling (fluid pill has been reduced); no shortness of breath, chest pain.  Review of Systems:  Review of Systems  Constitutional: Negative for fever, chills and malaise/fatigue.  Respiratory: Negative for shortness of breath.   Cardiovascular: Negative for chest pain.  Gastrointestinal: Negative for abdominal pain, diarrhea and constipation.  Genitourinary: Negative for dysuria.  Musculoskeletal: Positive for joint pain (increase pain and swelling in left elbow).  Skin: Negative.   Neurological: Negative for tingling, weakness and headaches.  Psychiatric/Behavioral: Positive for memory loss.     Past Medical History  Diagnosis Date  . SYSTOLIC HEART FAILURE, ACUTE   . PULMONARY HYPERTENSION   . PAROXYSMAL ATRIAL FIBRILLATION   . HYPERTENSION   . DVT   . CARDIOMYOPATHY   . CAD, NATIVE VESSEL   . TOBACCO USE, QUIT   . RENAL DISEASE, CHRONIC, STAGE III   . MURAL THROMBUS, APEX OF HEART   . Long term current use of anticoagulant   . HYPOKALEMIA   . DM   . ANEMIA, IRON DEFICIENCY   . CHF (congestive heart failure)   . Senile cataract, unspecified   . Other specified disease of nail   . Unspecified hearing loss   . Chronic kidney disease, stage II (mild)   . Memory loss   . Unspecified urinary incontinence   . Abnormality  of gait   . Unspecified hypothyroidism   . Unspecified vitamin D deficiency   . Anemia, unspecified   . Coronary atherosclerosis of native coronary artery   . Acute systolic heart failure   . Primary pulmonary hypertension   . Cardiomyopathy in other diseases classified elsewhere   . Other seborrheic dermatitis   . Other and unspecified angina pectoris   . Other and unspecified hyperlipidemia   . Slowing of urinary stream   . Type II or unspecified type diabetes mellitus without mention of complication, not stated as uncontrolled   . Unspecified essential hypertension   . Esophageal reflux   . Edema   . Shortness of breath   . Hydrocele, unspecified   . Impotence of organic origin   . Edema    Past Surgical History  Procedure Laterality Date  . Cholecystectomy  1985  . Placement of a veriflex bare-metal sten    . Coronary angioplasty with stent placement      VeriFLEX bare-metal stent   Social History:   reports that he quit smoking about 14 years ago. His smoking use included Cigarettes. He has a 50 pack-year smoking history. He has never used smokeless tobacco. He reports that he drinks about 8.4 ounces of alcohol per week. He reports that he does not use illicit drugs.  Family History  Problem Relation Age  of Onset  . Diabetes Mother   . Stroke Father   . Cancer Sister     Medications: Patient's Medications  New Prescriptions   No medications on file  Previous Medications   AMIODARONE (PACERONE) 200 MG TABLET    Take 0.5 tablets (100 mg total) by mouth daily.   ASPIRIN EC 81 MG TABLET    Take 81 mg by mouth daily.   DONEPEZIL (ARICEPT) 5 MG TABLET    Take 1 tablet (5 mg total) by mouth at bedtime.   FUROSEMIDE (LASIX) 40 MG TABLET    Take 40 mg by mouth daily. One daily to prevent fluid retention in the morning.   HYDROCODONE-ACETAMINOPHEN (NORCO/VICODIN) 5-325 MG PER TABLET    One tablet every 6 hours if needed for pain   INSULIN GLARGINE (LANTUS) 100 UNIT/ML  INJECTION    Inject 5 Units into the skin at bedtime.   INSULIN PEN NEEDLE (B-D ULTRAFINE III SHORT PEN) 31G X 8 MM MISC    Use as Directed. DX: 250.00   ISOSORBIDE MONONITRATE (IMDUR) 30 MG 24 HR TABLET    TAKE 1 TABLET EVERY DAY   LEVOTHYROXINE (SYNTHROID, LEVOTHROID) 50 MCG TABLET    Take 1 tablet (50 mcg total) by mouth daily.   MIRTAZAPINE (REMERON) 15 MG TABLET    One at bedtime to help stimulate appetite   NITROGLYCERIN (NITROSTAT) 0.4 MG SL TABLET    Place 1 tablet (0.4 mg total) under the tongue every 5 (five) minutes as needed for chest pain.   OMEPRAZOLE (PRILOSEC) 40 MG CAPSULE    Take 1 capsule (40 mg total) by mouth daily.   TRAMADOL (ULTRAM) 50 MG TABLET    Take 1 tablet (50 mg total) by mouth every 6 (six) hours as needed for pain.  Modified Medications   No medications on file  Discontinued Medications   No medications on file     Physical Exam:  Filed Vitals:   06/27/13 1550  BP: 104/68  Pulse: 93  Temp: 98.1 F (36.7 C)  TempSrc: Oral  Weight: 163 lb (73.936 kg)  SpO2: 94%    Physical Exam  Constitutional: He is well-developed, well-nourished, and in no distress.  Pulmonary/Chest: Effort normal and breath sounds normal. No respiratory distress.  Abdominal: Soft. Bowel sounds are normal.  Musculoskeletal: Normal range of motion. He exhibits no tenderness. Edema: +2 edema to ankles   Large left olecranon bursal enlargement; painful to touch, redness noted  Neurological: He is alert.  Skin: Skin is warm and dry. He is not diaphoretic.  Psychiatric: Affect normal.     Labs reviewed: Basic Metabolic Panel:  Recent Labs  16/10/96 1655  02/01/13 1126 02/06/13 1655 02/12/13 1726 04/10/13 1617  NA  --   < > 139 137 141  --   K  --   < > 3.9 3.9 3.4*  --   CL  --   < > 98 100 98  --   CO2  --   < > 26 29 27   --   GLUCOSE  --   < > 106* 118* 166*  --   BUN  --   < > 22 24* 22  --   CREATININE 1.89*  < > 1.98* 1.94* 1.98*  --   CALCIUM  --   < > 9.1 9.5  9.5  --   TSH 4.213  --   --   --   --  2.470  < > = values in this  interval not displayed. Liver Function Tests:  Recent Labs  12/06/12 1032 12/06/12 1655 12/07/12 0625  AST 35 33 29  ALT 22 21 20   ALKPHOS 80 72 67  BILITOT 1.5* 1.6* 1.5*  PROT 6.5 6.0 5.6*  ALBUMIN 3.2* 2.9* 2.7*   No results found for this basename: LIPASE, AMYLASE,  in the last 8760 hours  Recent Labs  12/06/12 1701  AMMONIA 20   CBC:  Recent Labs  12/07/12 0625 02/06/13 1655 04/25/13 1306  WBC 7.4 8.1 9.5  NEUTROABS  --   --  6.3  HGB 10.3* 12.7* 13.8  HCT 30.3* 37.5* 41.3  MCV 91.3 92.8 95  PLT 144* 164 227    Assessment/Plan 1. Bursitis, olecranon, left - doxycycline (VIBRA-TABS) 100 MG tablet; Take 1 tablet (100 mg total) by mouth 2 (two) times daily.  Dispense: 20 tablet; Refill: 0 - Ambulatory referral to Orthopedics for further recs; may need further intervention  - to follow up/seek medical attention if getting worse despite use of antibiotic, fever or chills occur

## 2013-07-14 ENCOUNTER — Other Ambulatory Visit: Payer: Self-pay | Admitting: Nurse Practitioner

## 2013-07-19 ENCOUNTER — Telehealth: Payer: Self-pay | Admitting: *Deleted

## 2013-07-19 NOTE — Telephone Encounter (Signed)
Patient sister called and stated that patient's leg swollen and weeping and tender. Sister has an appointment for herself this afternoon. Advised sister to take patient to the [facility] to be evaluated today. Sister Agreed and stated that she will take him and call and keep [place] informed.

## 2013-07-22 ENCOUNTER — Emergency Department (HOSPITAL_COMMUNITY): Payer: Medicare Other

## 2013-07-22 ENCOUNTER — Inpatient Hospital Stay (HOSPITAL_COMMUNITY)
Admission: EM | Admit: 2013-07-22 | Discharge: 2013-07-26 | DRG: 603 | Disposition: A | Discharge status: Home / Self Care | Payer: Medicare Other | Attending: Internal Medicine | Admitting: Internal Medicine

## 2013-07-22 ENCOUNTER — Encounter (HOSPITAL_COMMUNITY): Payer: Self-pay | Admitting: Emergency Medicine

## 2013-07-22 DIAGNOSIS — R079 Chest pain, unspecified: Secondary | ICD-10-CM

## 2013-07-22 DIAGNOSIS — I509 Heart failure, unspecified: Secondary | ICD-10-CM | POA: Diagnosis present

## 2013-07-22 DIAGNOSIS — Z87891 Personal history of nicotine dependence: Secondary | ICD-10-CM

## 2013-07-22 DIAGNOSIS — I10 Essential (primary) hypertension: Secondary | ICD-10-CM

## 2013-07-22 DIAGNOSIS — K219 Gastro-esophageal reflux disease without esophagitis: Secondary | ICD-10-CM | POA: Diagnosis present

## 2013-07-22 DIAGNOSIS — I2510 Atherosclerotic heart disease of native coronary artery without angina pectoris: Secondary | ICD-10-CM

## 2013-07-22 DIAGNOSIS — H9190 Unspecified hearing loss, unspecified ear: Secondary | ICD-10-CM | POA: Diagnosis present

## 2013-07-22 DIAGNOSIS — I4891 Unspecified atrial fibrillation: Secondary | ICD-10-CM

## 2013-07-22 DIAGNOSIS — I5022 Chronic systolic (congestive) heart failure: Secondary | ICD-10-CM

## 2013-07-22 DIAGNOSIS — N058 Unspecified nephritic syndrome with other morphologic changes: Secondary | ICD-10-CM | POA: Diagnosis present

## 2013-07-22 DIAGNOSIS — I5042 Chronic combined systolic (congestive) and diastolic (congestive) heart failure: Secondary | ICD-10-CM | POA: Diagnosis present

## 2013-07-22 DIAGNOSIS — F3289 Other specified depressive episodes: Secondary | ICD-10-CM | POA: Diagnosis present

## 2013-07-22 DIAGNOSIS — Z9119 Patient's noncompliance with other medical treatment and regimen: Secondary | ICD-10-CM

## 2013-07-22 DIAGNOSIS — E039 Hypothyroidism, unspecified: Secondary | ICD-10-CM | POA: Diagnosis present

## 2013-07-22 DIAGNOSIS — L02619 Cutaneous abscess of unspecified foot: Principal | ICD-10-CM | POA: Diagnosis present

## 2013-07-22 DIAGNOSIS — E1129 Type 2 diabetes mellitus with other diabetic kidney complication: Secondary | ICD-10-CM | POA: Diagnosis present

## 2013-07-22 DIAGNOSIS — Z823 Family history of stroke: Secondary | ICD-10-CM

## 2013-07-22 DIAGNOSIS — N183 Chronic kidney disease, stage 3 unspecified: Secondary | ICD-10-CM | POA: Diagnosis present

## 2013-07-22 DIAGNOSIS — F329 Major depressive disorder, single episode, unspecified: Secondary | ICD-10-CM | POA: Diagnosis present

## 2013-07-22 DIAGNOSIS — Z91199 Patient's noncompliance with other medical treatment and regimen due to unspecified reason: Secondary | ICD-10-CM

## 2013-07-22 DIAGNOSIS — I129 Hypertensive chronic kidney disease with stage 1 through stage 4 chronic kidney disease, or unspecified chronic kidney disease: Secondary | ICD-10-CM | POA: Diagnosis present

## 2013-07-22 DIAGNOSIS — D509 Iron deficiency anemia, unspecified: Secondary | ICD-10-CM | POA: Diagnosis present

## 2013-07-22 DIAGNOSIS — Z833 Family history of diabetes mellitus: Secondary | ICD-10-CM

## 2013-07-22 DIAGNOSIS — R609 Edema, unspecified: Secondary | ICD-10-CM

## 2013-07-22 DIAGNOSIS — F0390 Unspecified dementia without behavioral disturbance: Secondary | ICD-10-CM | POA: Diagnosis present

## 2013-07-22 DIAGNOSIS — I480 Paroxysmal atrial fibrillation: Secondary | ICD-10-CM

## 2013-07-22 DIAGNOSIS — E785 Hyperlipidemia, unspecified: Secondary | ICD-10-CM | POA: Diagnosis present

## 2013-07-22 DIAGNOSIS — Z9861 Coronary angioplasty status: Secondary | ICD-10-CM

## 2013-07-22 DIAGNOSIS — I428 Other cardiomyopathies: Secondary | ICD-10-CM | POA: Diagnosis present

## 2013-07-22 DIAGNOSIS — E1121 Type 2 diabetes mellitus with diabetic nephropathy: Secondary | ICD-10-CM

## 2013-07-22 DIAGNOSIS — E119 Type 2 diabetes mellitus without complications: Secondary | ICD-10-CM

## 2013-07-22 DIAGNOSIS — E559 Vitamin D deficiency, unspecified: Secondary | ICD-10-CM | POA: Diagnosis present

## 2013-07-22 DIAGNOSIS — Z86718 Personal history of other venous thrombosis and embolism: Secondary | ICD-10-CM

## 2013-07-22 DIAGNOSIS — L03115 Cellulitis of right lower limb: Secondary | ICD-10-CM

## 2013-07-22 DIAGNOSIS — I872 Venous insufficiency (chronic) (peripheral): Secondary | ICD-10-CM | POA: Diagnosis present

## 2013-07-22 DIAGNOSIS — I270 Primary pulmonary hypertension: Secondary | ICD-10-CM | POA: Diagnosis present

## 2013-07-22 LAB — CBC WITH DIFFERENTIAL/PLATELET
Basophils Absolute: 0 10*3/uL (ref 0.0–0.1)
Basophils Relative: 0 % (ref 0–1)
Eosinophils Absolute: 0.3 10*3/uL (ref 0.0–0.7)
HCT: 38.6 % — ABNORMAL LOW (ref 39.0–52.0)
Hemoglobin: 12.7 g/dL — ABNORMAL LOW (ref 13.0–17.0)
Lymphocytes Relative: 23 % (ref 12–46)
Lymphs Abs: 1.8 10*3/uL (ref 0.7–4.0)
MCH: 31.4 pg (ref 26.0–34.0)
MCHC: 32.9 g/dL (ref 30.0–36.0)
Monocytes Absolute: 0.9 10*3/uL (ref 0.1–1.0)
Monocytes Relative: 12 % (ref 3–12)
Neutro Abs: 4.8 10*3/uL (ref 1.7–7.7)
Neutrophils Relative %: 62 % (ref 43–77)
Platelets: 199 10*3/uL (ref 150–400)
RBC: 4.04 MIL/uL — ABNORMAL LOW (ref 4.22–5.81)
RDW: 14.5 % (ref 11.5–15.5)
WBC: 7.7 10*3/uL (ref 4.0–10.5)

## 2013-07-22 LAB — CBC
HCT: 38 % — ABNORMAL LOW (ref 39.0–52.0)
Hemoglobin: 12.5 g/dL — ABNORMAL LOW (ref 13.0–17.0)
MCH: 31.3 pg (ref 26.0–34.0)
MCHC: 32.9 g/dL (ref 30.0–36.0)
MCV: 95 fL (ref 78.0–100.0)
RBC: 4 MIL/uL — ABNORMAL LOW (ref 4.22–5.81)
RDW: 14.4 % (ref 11.5–15.5)

## 2013-07-22 LAB — TROPONIN I: Troponin I: <0.3 ng/mL (ref <0.30)

## 2013-07-22 LAB — BASIC METABOLIC PANEL
BUN: 17 mg/dL (ref 6–23)
CO2: 28 mEq/L (ref 19–32)
Calcium: 9.5 mg/dL (ref 8.4–10.5)
Creatinine, Ser: 1.52 mg/dL — ABNORMAL HIGH (ref 0.50–1.35)
GFR calc Af Amer: 48 mL/min — ABNORMAL LOW (ref >90)
GFR calc non Af Amer: 41 mL/min — ABNORMAL LOW (ref >90)
Sodium: 138 mEq/L (ref 135–145)

## 2013-07-22 LAB — GLUCOSE, CAPILLARY
Glucose-Capillary: 113 mg/dL — ABNORMAL HIGH (ref 70–99)
Glucose-Capillary: 119 mg/dL — ABNORMAL HIGH (ref 70–99)

## 2013-07-22 LAB — CREATININE, SERUM: GFR calc non Af Amer: 44 mL/min — ABNORMAL LOW (ref >90)

## 2013-07-22 MED ORDER — ACETAMINOPHEN 650 MG RE SUPP: 650.0000 mg | Freq: Four times a day (QID) | RECTAL | Status: DC | PRN

## 2013-07-22 MED ORDER — CLINDAMYCIN PHOSPHATE 600 MG/50ML IV SOLN
600.0000 mg | Freq: Once | INTRAVENOUS | Status: AC
  Administered 2013-07-22: 600 mg via INTRAVENOUS
  Filled 2013-07-22: qty 50

## 2013-07-22 MED ORDER — ACETAMINOPHEN 325 MG PO TABS
650.0000 mg | ORAL_TABLET | Freq: Four times a day (QID) | ORAL | Status: DC | PRN
  Administered 2013-07-23: 650 mg via ORAL
  Filled 2013-07-22: qty 2

## 2013-07-22 MED ORDER — AMIODARONE HCL 100 MG PO TABS
100.0000 mg | ORAL_TABLET | Freq: Every day | ORAL | Status: DC
  Administered 2013-07-23 – 2013-07-26 (×4): 100 mg via ORAL
  Filled 2013-07-22 (×4): qty 1

## 2013-07-22 MED ORDER — POLYETHYLENE GLYCOL 3350 17 G PO PACK
17.0000 g | PACK | Freq: Every day | ORAL | Status: DC | PRN
  Filled 2013-07-22: qty 1

## 2013-07-22 MED ORDER — LEVOTHYROXINE SODIUM 50 MCG PO TABS
50.0000 ug | ORAL_TABLET | Freq: Every day | ORAL | Status: DC
  Administered 2013-07-23 – 2013-07-26 (×4): 50 ug via ORAL
  Filled 2013-07-22 (×5): qty 1

## 2013-07-22 MED ORDER — SODIUM CHLORIDE 0.9 % IJ SOLN
3.0000 mL | Freq: Two times a day (BID) | INTRAMUSCULAR | Status: DC
  Administered 2013-07-22 – 2013-07-26 (×7): 3 mL via INTRAVENOUS

## 2013-07-22 MED ORDER — HYDROCODONE-ACETAMINOPHEN 5-325 MG PO TABS
2.0000 | ORAL_TABLET | ORAL | Status: DC | PRN
  Administered 2013-07-25: 2 via ORAL
  Filled 2013-07-22: qty 2

## 2013-07-22 MED ORDER — NITROGLYCERIN 0.4 MG SL SUBL: 0.4000 mg | SUBLINGUAL_TABLET | SUBLINGUAL | Status: DC | PRN

## 2013-07-22 MED ORDER — HEPARIN SODIUM (PORCINE) 5000 UNIT/ML IJ SOLN
5000.0000 [IU] | Freq: Three times a day (TID) | INTRAMUSCULAR | Status: DC
  Administered 2013-07-22 – 2013-07-26 (×10): 5000 [IU] via SUBCUTANEOUS
  Filled 2013-07-22 (×14): qty 1

## 2013-07-22 MED ORDER — INSULIN GLARGINE 100 UNIT/ML ~~LOC~~ SOLN
5.0000 [IU] | Freq: Every day | SUBCUTANEOUS | Status: DC
  Administered 2013-07-22 – 2013-07-25 (×4): 5 [IU] via SUBCUTANEOUS
  Filled 2013-07-22 (×5): qty 0.05

## 2013-07-22 MED ORDER — FUROSEMIDE 40 MG PO TABS
40.0000 mg | ORAL_TABLET | Freq: Every day | ORAL | Status: DC
  Administered 2013-07-23 – 2013-07-26 (×4): 40 mg via ORAL
  Filled 2013-07-22 (×4): qty 1

## 2013-07-22 MED ORDER — SODIUM CHLORIDE 0.9 % IJ SOLN: 3.0000 mL | INTRAMUSCULAR | Status: DC | PRN

## 2013-07-22 MED ORDER — SODIUM CHLORIDE 0.9 % IV SOLN: 250.0000 mL | INTRAVENOUS | Status: DC | PRN

## 2013-07-22 MED ORDER — ONDANSETRON HCL 4 MG PO TABS: 4.0000 mg | ORAL_TABLET | Freq: Four times a day (QID) | ORAL | Status: DC | PRN

## 2013-07-22 MED ORDER — ASPIRIN EC 81 MG PO TBEC
81.0000 mg | DELAYED_RELEASE_TABLET | Freq: Every day | ORAL | Status: DC
  Administered 2013-07-23 – 2013-07-26 (×4): 81 mg via ORAL
  Filled 2013-07-22 (×4): qty 1

## 2013-07-22 MED ORDER — PANTOPRAZOLE SODIUM 40 MG PO TBEC
40.0000 mg | DELAYED_RELEASE_TABLET | Freq: Every day | ORAL | Status: DC
  Administered 2013-07-23 – 2013-07-26 (×4): 40 mg via ORAL
  Filled 2013-07-22 (×4): qty 1

## 2013-07-22 MED ORDER — ONDANSETRON HCL 4 MG/2ML IJ SOLN: 4.0000 mg | Freq: Four times a day (QID) | INTRAMUSCULAR | Status: DC | PRN

## 2013-07-22 MED ORDER — ISOSORBIDE MONONITRATE ER 30 MG PO TB24
30.0000 mg | ORAL_TABLET | Freq: Every day | ORAL | Status: DC
  Administered 2013-07-23 – 2013-07-26 (×4): 30 mg via ORAL
  Filled 2013-07-22 (×4): qty 1

## 2013-07-22 MED ORDER — VANCOMYCIN HCL 10 G IV SOLR
1250.0000 mg | INTRAVENOUS | Status: DC
  Administered 2013-07-22: 1250 mg via INTRAVENOUS
  Filled 2013-07-22 (×2): qty 1250

## 2013-07-22 NOTE — Progress Notes (Addendum)
ANTIBIOTIC CONSULT NOTE - INITIAL  Pharmacy Consult for Vancomycin Indication: cellulitis  Allergies  Allergen Reactions  . Lisinopril     REACTION: Cough. Unsure if this is a true allergy. Pt takes this med everyday with out any known side effects per family.    Patient Measurements: Height: 5\' 10"  (177.8 cm) Weight: 163 lb (73.936 kg) IBW/kg (Calculated) : 73  Labs:  Recent Labs  07/22/13 1245  WBC 7.7  HGB 12.7*  PLT 199  CREATININE 1.52*   Estimated Creatinine Clearance: 39.4 ml/min (by C-G formula based on Cr of 1.52).  Microbiology: No results found for this or any previous visit (from the past 720 hour(s)).  Medical History: Past Medical History  Diagnosis Date  . SYSTOLIC HEART FAILURE, ACUTE   . PULMONARY HYPERTENSION   . PAROXYSMAL ATRIAL FIBRILLATION   . HYPERTENSION   . DVT   . CARDIOMYOPATHY   . CAD, NATIVE VESSEL   . TOBACCO USE, QUIT   . RENAL DISEASE, CHRONIC, STAGE III   . MURAL THROMBUS, APEX OF HEART   . Long term current use of anticoagulant   . HYPOKALEMIA   . DM   . ANEMIA, IRON DEFICIENCY   . CHF (congestive heart failure)   . Senile cataract, unspecified   . Other specified disease of nail   . Unspecified hearing loss   . Chronic kidney disease, stage II (mild)   . Memory loss   . Unspecified urinary incontinence   . Abnormality of gait   . Unspecified hypothyroidism   . Unspecified vitamin D deficiency   . Anemia, unspecified   . Coronary atherosclerosis of native coronary artery   . Acute systolic heart failure   . Primary pulmonary hypertension   . Cardiomyopathy in other diseases classified elsewhere   . Other seborrheic dermatitis   . Other and unspecified angina pectoris   . Other and unspecified hyperlipidemia   . Slowing of urinary stream   . Type II or unspecified type diabetes mellitus without mention of complication, not stated as uncontrolled   . Unspecified essential hypertension   . Esophageal reflux   . Edema    . Shortness of breath   . Hydrocele, unspecified   . Impotence of organic origin   . Edema    Assessment: 79 year old beginning Vancomycin for cellulitis.  Scr = 1.52  Goal of Therapy:  Vancomycin trough level 10-15 mcg/ml  Plan:  1) Vancomycin 1250 mg iv Q 24 hours 2) Follow up Scr, cultures, fever curve  Thank you. Okey Regal, PharmD  Elwin Sleight 07/22/2013,2:11 PM  Addum:  Marylin Crosby has increased to 1.61.  Will adjust vanc to 750 mg IV q24 hours.

## 2013-07-22 NOTE — H&P (Signed)
[facility] History and Physical  Joel Walker ZOX:[id number] DOB: 1930/01/24 DOA: 07/22/2013  Referring physician: Dr. Bernette Mayers PCP: Kimber Relic, MD  Specialists: noen  Chief Complaint: right lower extremity swelling  HPI: Joel Walker is a 79 y.o. male  With past medical history of chronic systolic and diastolic heart failure with an ejection fraction of 30%, memory loss, diabetes with last hemoglobin A1c of 6.0, paroxysmal A. fib not on Coumadin due to noncompliance, also history of left apical thrombus. That comes in for right lower extremity swelling and redness. Has been going on for a week and today the pain was unbearable head and weeping exquisitely tender to touch. He denies any fever chills nausea or vomiting. And some mild chest pain that is relieved by nitroglycerin unchanged. Has been recently treated for with doxycycline for bursitis for one week,   Review of Systems: The patient denies anorexia, fever, weight loss,, vision loss, decreased hearing, hoarseness, chest pain, syncope, dyspnea on exertion,  balance deficits, hemoptysis, abdominal pain, melena, hematochezia, severe indigestion/heartburn, hematuria, incontinence, genital sores, muscle weakness, suspicious skin lesions, transient blindness, difficulty walking, depression, unusual weight change, abnormal bleeding, enlarged lymph nodes, angioedema, and breast masses.    Past Medical History  Diagnosis Date  . SYSTOLIC HEART FAILURE, ACUTE   . PULMONARY HYPERTENSION   . PAROXYSMAL ATRIAL FIBRILLATION   . HYPERTENSION   . DVT   . CARDIOMYOPATHY   . CAD, NATIVE VESSEL   . TOBACCO USE, QUIT   . RENAL DISEASE, CHRONIC, STAGE III   . MURAL THROMBUS, APEX OF HEART   . Long term current use of anticoagulant   . HYPOKALEMIA   . DM   . ANEMIA, IRON DEFICIENCY   . CHF (congestive heart failure)   . Senile cataract, unspecified   . Other specified disease of nail   . Unspecified hearing loss   .  Chronic kidney disease, stage II (mild)   . Memory loss   . Unspecified urinary incontinence   . Abnormality of gait   . Unspecified hypothyroidism   . Unspecified vitamin D deficiency   . Anemia, unspecified   . Coronary atherosclerosis of native coronary artery   . Acute systolic heart failure   . Primary pulmonary hypertension   . Cardiomyopathy in other diseases classified elsewhere   . Other seborrheic dermatitis   . Other and unspecified angina pectoris   . Other and unspecified hyperlipidemia   . Slowing of urinary stream   . Type II or unspecified type diabetes mellitus without mention of complication, not stated as uncontrolled   . Unspecified essential hypertension   . Esophageal reflux   . Edema   . Shortness of breath   . Hydrocele, unspecified   . Impotence of organic origin   . Edema    Past Surgical History  Procedure Laterality Date  . Cholecystectomy  1985  . Placement of a veriflex bare-metal sten    . Coronary angioplasty with stent placement      VeriFLEX bare-metal stent   Social History:  reports that he quit smoking about 14 years ago. His smoking use included Cigarettes. He has a 50 pack-year smoking history. He has never used smokeless tobacco. He reports that he drinks about 8.4 ounces of alcohol per week. He reports that he does not use illicit drugs. Lives at home has a 24-hour caregiver  Allergies  Allergen Reactions  . Lisinopril     REACTION: Cough. Unsure if this is a true  allergy. Pt takes this med everyday with out any known side effects per family.    Family History  Problem Relation Age of Onset  . Diabetes Mother   . Stroke Father   . Cancer Sister     Prior to Admission medications   Medication Sig Start Date End Date Taking? Authorizing Provider  amiodarone (PACERONE) 200 MG tablet Take 0.5 tablets (100 mg total) by mouth daily. 12/11/12  Yes Kela Millin, MD  aspirin EC 81 MG tablet Take 81 mg by mouth daily.   Yes Historical  Provider, MD  donepezil (ARICEPT) 5 MG tablet TAKE 1 TABLET BY MOUTH AT BEDTIME 07/14/13  Yes Kimber Relic, MD  doxycycline (VIBRA-TABS) 100 MG tablet Take 1 tablet (100 mg total) by mouth 2 (two) times daily. 06/27/13  Yes Claudie Revering, NP  furosemide (LASIX) 40 MG tablet Take 40 mg by mouth daily. One daily to prevent fluid retention in the morning. 04/11/13  Yes Kimber Relic, MD  HYDROcodone-acetaminophen (NORCO/VICODIN) 5-325 MG per tablet One tablet every 6 hours if needed for pain 06/19/13  Yes Kimber Relic, MD  insulin glargine (LANTUS) 100 UNIT/ML injection Inject 5 Units into the skin at bedtime.   Yes Historical Provider, MD  Insulin Pen Needle (B-D ULTRAFINE III SHORT PEN) 31G X 8 MM MISC Use as Directed. DX: 250.00 04/20/13  Yes Tiffany L Reed, DO  isosorbide mononitrate (IMDUR) 30 MG 24 hr tablet TAKE 1 TABLET EVERY DAY 06/17/13  Yes Tiffany L Reed, DO  levothyroxine (SYNTHROID, LEVOTHROID) 50 MCG tablet Take 1 tablet (50 mcg total) by mouth daily. 01/24/13  Yes Claudie Revering, NP  mirtazapine (REMERON) 15 MG tablet One at bedtime to help stimulate appetite 06/19/13  Yes Kimber Relic, MD  nitroGLYCERIN (NITROSTAT) 0.4 MG SL tablet Place 1 tablet (0.4 mg total) under the tongue every 5 (five) minutes as needed for chest pain. 02/28/13  Yes Kimber Relic, MD  omeprazole (PRILOSEC) 40 MG capsule Take 1 capsule (40 mg total) by mouth daily. 04/25/13  Yes Kimber Relic, MD  traMADol (ULTRAM) 50 MG tablet Take 1 tablet (50 mg total) by mouth every 6 (six) hours as needed for pain. 06/19/13  Yes Kimber Relic, MD   Physical Exam: Filed Vitals:   07/22/13 1201  BP: 147/81  Pulse: 74  Temp: 98.1 F (36.7 C)    BP 170/85  Pulse 72  Temp(Src) 98.1 F (36.7 C) (Oral)  Resp 17  Ht 5\' 10"  (1.778 m)  Wt 73.936 kg (163 lb)  BMI 23.39 kg/m2  SpO2 100%  General Appearance:    Alert, cooperative, no distress, appears stated age  Head:    Normocephalic, without obvious abnormality,  atraumatic           Throat:   Lips, mucosa, and tongue dry   Neck:   Supple, symmetrical, trachea midline, no adenopathy;       thyroid:  No  JVD     Lungs:     Clear to auscultation bilaterally, respirations unlabored     Heart:    Regular rate and rhythm, S1 and S2 normal, no murmur, rub   or gallop  Abdomen:     Soft, non-tender, bowel sounds active all four quadrants,    no masses, no organomegaly        Extremities:   the right lower extremity has 3+ pitting edema up to the knee   Pulses:   2+ and  symmetric all extremities  Skin:   extremely red with multiple dry scabs and tender to palpation.   Lymph nodes:   no inguinal lymphadenopathy   Neurologic:   CNII-XII intact. Normal strength, sensation and reflexes      throughout      Labs on Admission:  Basic Metabolic Panel:  Recent Labs Lab 07/22/13 1245  NA 138  K 4.1  CL 100  CO2 28  GLUCOSE 106*  BUN 17  CREATININE 1.52*  CALCIUM 9.5   Liver Function Tests: No results found for this basename: AST, ALT, ALKPHOS, BILITOT, PROT, ALBUMIN,  in the last 168 hours No results found for this basename: LIPASE, AMYLASE,  in the last 168 hours No results found for this basename: AMMONIA,  in the last 168 hours CBC:  Recent Labs Lab 07/22/13 1245  WBC 7.7  NEUTROABS 4.8  HGB 12.7*  HCT 38.6*  MCV 95.5  PLT 199   Cardiac Enzymes:  Recent Labs Lab 07/22/13 1245  TROPONINI <0.30    BNP (last 3 results)  Recent Labs  12/06/12 1051 12/08/12 0653 02/06/13 1900  PROBNP 5232.0* 8353.0* 2842.0*   CBG: No results found for this basename: GLUCAP,  in the last 168 hours  Radiological Exams on Admission: Dg Chest 2 View  07/22/2013   CLINICAL DATA:  Chest pain, shortness of breath  EXAM: CHEST  2 VIEW  COMPARISON:  02/06/2013  FINDINGS: Low lung volumes. Normal heart size and vascularity. Minor basilar densities, suspect atelectasis. No definite CHF or pneumonia. Negative for effusion or pneumothorax.  Atherosclerosis of the aorta evident. Degenerative changes of the spine and shoulders. Prior cholecystectomy evident.  IMPRESSION: Stable low volume chest exam.  No superimposed acute process   Electronically Signed   By: Ruel Favors M.D.   On: 07/22/2013 13:11    EKG: Independently reviewed. Sinus rhythm with first-degree AV block.  Assessment/Plan Cellulitis of right foot: - Start her on IV vancomycin, was previously on doxycycline that is probably why he has not had any fevers and no leukocytosis on CBC, blood cultures x2, try to keep the leg elevated above heart. - Low extremity Dopplers done that ruled out DVT. As talking to the family his head chronic venous insufficiency problems with this leg in the past. - We'll use Tylenol for fever continue Norco for pain. - PT OT consult.  RENAL DISEASE, CHRONIC, STAGE III - GFR is currently at baseline.  Chronic systolic heart failure - Compensated continue current home meds.  Diabetes mellitus - Continue Lantus, CBG a.c. and at bedtime Will hold on on sliding scale insulin. As he had a hemoglobin A1c of 6.0.  Paroxysmal atrial fibrillation - Continue amiodarone and aspirin. - EKG shows Sinus rhythm. - Pursuit of cardiac enzymes is negative EKG is unchanged. This most likely angina which seems to be stable.  Code Status: full Family Communication: sister Disposition Plan: inpatient  Time spent: 83 mi  FELIZ Rosine Beat [facility] Pager [phone]  If 7PM-7AM, please contact night-coverage [email or web address] Password [email or web address] 07/22/2013, 2:15 PM

## 2013-07-22 NOTE — ED Provider Notes (Signed)
CSN: [id number]     Arrival date & time 07/22/13  1155 History   First MD Initiated Contact with Patient 07/22/13 1214     Chief Complaint  Patient presents with  . Chest Pain  . Leg Swelling    Right leg   (Consider location/radiation/quality/duration/timing/severity/associated sxs/prior Treatment) Patient is a 79 y.o. male presenting with chest pain.  Chest Pain  Level 5 caveat due to memory problems, history from patient and daughter.  Pt with numerous medical problems including memory loss and CAD brought by family for RLE swelling worsening for the last several days associated with redness and mild weeping, but no pus drainage. He also has had intermittent episodes of L chest pain, not SOB or febrile. No coughing. He has been on doxycycline for several weeks to treat an olecranon bursitis although family admits little improvement in the elbow.   Past Medical History  Diagnosis Date  . SYSTOLIC HEART FAILURE, ACUTE   . PULMONARY HYPERTENSION   . PAROXYSMAL ATRIAL FIBRILLATION   . HYPERTENSION   . DVT   . CARDIOMYOPATHY   . CAD, NATIVE VESSEL   . TOBACCO USE, QUIT   . RENAL DISEASE, CHRONIC, STAGE III   . MURAL THROMBUS, APEX OF HEART   . Long term current use of anticoagulant   . HYPOKALEMIA   . DM   . ANEMIA, IRON DEFICIENCY   . CHF (congestive heart failure)   . Senile cataract, unspecified   . Other specified disease of nail   . Unspecified hearing loss   . Chronic kidney disease, stage II (mild)   . Memory loss   . Unspecified urinary incontinence   . Abnormality of gait   . Unspecified hypothyroidism   . Unspecified vitamin D deficiency   . Anemia, unspecified   . Coronary atherosclerosis of native coronary artery   . Acute systolic heart failure   . Primary pulmonary hypertension   . Cardiomyopathy in other diseases classified elsewhere   . Other seborrheic dermatitis   . Other and unspecified angina pectoris   . Other and unspecified hyperlipidemia   .  Slowing of urinary stream   . Type II or unspecified type diabetes mellitus without mention of complication, not stated as uncontrolled   . Unspecified essential hypertension   . Esophageal reflux   . Edema   . Shortness of breath   . Hydrocele, unspecified   . Impotence of organic origin   . Edema    Past Surgical History  Procedure Laterality Date  . Cholecystectomy  1985  . Placement of a veriflex bare-metal sten    . Coronary angioplasty with stent placement      VeriFLEX bare-metal stent   Family History  Problem Relation Age of Onset  . Diabetes Mother   . Stroke Father   . Cancer Sister    History  Substance Use Topics  . Smoking status: Former Smoker -- 1.00 packs/day for 50 years    Types: Cigarettes    Quit date: 09/06/1998  . Smokeless tobacco: Never Used     Comment: 12/06/2012 "smoked from 1950 to 2000"  . Alcohol Use: 8.4 oz/week    14 Cans of beer per week     Comment: 12/06/2012 "2-3 beers/day; have whiskey q now and then"    Review of Systems  Cardiovascular: Positive for chest pain.   Unable to assess due to mental status.   Allergies  Lisinopril  Home Medications   Current Outpatient Rx  Name  Route  Sig  Dispense  Refill  . amiodarone (PACERONE) 200 MG tablet   Oral   Take 0.5 tablets (100 mg total) by mouth daily.         Marland Kitchen aspirin EC 81 MG tablet   Oral   Take 81 mg by mouth daily.         Marland Kitchen donepezil (ARICEPT) 5 MG tablet      TAKE 1 TABLET BY MOUTH AT BEDTIME   30 tablet   3   . doxycycline (VIBRA-TABS) 100 MG tablet   Oral   Take 1 tablet (100 mg total) by mouth 2 (two) times daily.   20 tablet   0   . furosemide (LASIX) 40 MG tablet   Oral   Take 40 mg by mouth daily. One daily to prevent fluid retention in the morning.         Marland Kitchen HYDROcodone-acetaminophen (NORCO/VICODIN) 5-325 MG per tablet      One tablet every 6 hours if needed for pain   120 tablet   0   . insulin glargine (LANTUS) 100 UNIT/ML injection    Subcutaneous   Inject 5 Units into the skin at bedtime.         . Insulin Pen Needle (B-D ULTRAFINE III SHORT PEN) 31G X 8 MM MISC      Use as Directed. DX: 250.00   100 each   11   . isosorbide mononitrate (IMDUR) 30 MG 24 hr tablet      TAKE 1 TABLET EVERY DAY   30 tablet   3   . levothyroxine (SYNTHROID, LEVOTHROID) 50 MCG tablet   Oral   Take 1 tablet (50 mcg total) by mouth daily.   90 tablet   3   . mirtazapine (REMERON) 15 MG tablet      One at bedtime to help stimulate appetite   60 tablet   3   . nitroGLYCERIN (NITROSTAT) 0.4 MG SL tablet   Sublingual   Place 1 tablet (0.4 mg total) under the tongue every 5 (five) minutes as needed for chest pain.   100 tablet   5   . omeprazole (PRILOSEC) 40 MG capsule   Oral   Take 1 capsule (40 mg total) by mouth daily.   30 capsule   3   . traMADol (ULTRAM) 50 MG tablet   Oral   Take 1 tablet (50 mg total) by mouth every 6 (six) hours as needed for pain.   100 tablet   0    BP 147/81  Pulse 74  Temp(Src) 98.1 F (36.7 C) (Oral)  Ht 5\' 10"  (1.778 m)  Wt 163 lb (73.936 kg)  BMI 23.39 kg/m2  SpO2 100% Physical Exam  Nursing note and vitals reviewed. Constitutional: He appears well-developed and well-nourished.  HENT:  Head: Normocephalic and atraumatic.  Eyes: EOM are normal. Pupils are equal, round, and reactive to light.  Neck: Normal range of motion. Neck supple.  Cardiovascular: Normal rate, normal heart sounds and intact distal pulses.   Pulmonary/Chest: Effort normal and breath sounds normal.  Abdominal: Bowel sounds are normal. He exhibits no distension. There is no tenderness.  Musculoskeletal: Normal range of motion. He exhibits edema (RLE markedly swollen and erythematous from foot to knee) and tenderness.  Neurological: He is alert. He has normal strength. No cranial nerve deficit or sensory deficit.  Skin: Skin is warm and dry. No rash noted.  Psychiatric: He has a normal mood and affect.  ED Course  Procedures (including critical care time) Labs Review Labs Reviewed  CBC WITH DIFFERENTIAL - Abnormal; Notable for the following:    RBC 4.04 (*)    Hemoglobin 12.7 (*)    HCT 38.6 (*)    All other components within normal limits  BASIC METABOLIC PANEL - Abnormal; Notable for the following:    Glucose, Bld 106 (*)    Creatinine, Ser 1.52 (*)    GFR calc non Af Amer 41 (*)    GFR calc Af Amer 48 (*)    All other components within normal limits  TROPONIN I  PROTIME-INR  APTT   Imaging Review Dg Chest 2 View  07/22/2013   CLINICAL DATA:  Chest pain, shortness of breath  EXAM: CHEST  2 VIEW  COMPARISON:  02/06/2013  FINDINGS: Low lung volumes. Normal heart size and vascularity. Minor basilar densities, suspect atelectasis. No definite CHF or pneumonia. Negative for effusion or pneumothorax. Atherosclerosis of the aorta evident. Degenerative changes of the spine and shoulders. Prior cholecystectomy evident.  IMPRESSION: Stable low volume chest exam.  No superimposed acute process   Electronically Signed   By: Ruel Favors M.D.   On: 07/22/2013 13:11    EKG Interpretation     Ventricular Rate:  72 PR Interval:  236 QRS Duration: 108 QT Interval:  448 QTC Calculation: 490 R Axis:   4 Text Interpretation:  Sinus rhythm with 1st degree A-V block Possible Inferior infarct , age undetermined Anterior infarct , age undetermined Abnormal ECG No significant change since last tracing            MDM   1. Chest pain   2. Cellulitis of right leg     Pt with neg Doppler study per Vascular Tech. PE unlikely, will hold on CTA for now given borderline creatinine. Clinda started for leg cellulitis. Admit for further treatment and rule out of MI.     Charles B. Bernette Mayers, MD 07/22/13 [phone]

## 2013-07-22 NOTE — Progress Notes (Signed)
VASCULAR LAB PRELIMINARY  PRELIMINARY  PRELIMINARY  PRELIMINARY  Right lower extremity venous Doppler completed.    Preliminary report:  There is no obvious DVT or SVT noted in the right lower extremity.  Yanil Dawe, RVT 07/22/2013, 1:31 PM

## 2013-07-22 NOTE — ED Notes (Signed)
Visitor and pt c/ left sided chest pain for about 2 days and swelling right leg x 1 week. Pt talking in complete sentences, denies chest pain at present. Presents with swelling and redness to right lower leg.

## 2013-07-23 DIAGNOSIS — L02619 Cutaneous abscess of unspecified foot: Principal | ICD-10-CM

## 2013-07-23 DIAGNOSIS — E1121 Type 2 diabetes mellitus with diabetic nephropathy: Secondary | ICD-10-CM

## 2013-07-23 DIAGNOSIS — I5022 Chronic systolic (congestive) heart failure: Secondary | ICD-10-CM

## 2013-07-23 DIAGNOSIS — E119 Type 2 diabetes mellitus without complications: Secondary | ICD-10-CM

## 2013-07-23 LAB — GLUCOSE, CAPILLARY

## 2013-07-23 LAB — COMPREHENSIVE METABOLIC PANEL
ALT: 13 U/L (ref 0–53)
AST: 18 U/L (ref 0–37)
Albumin: 2.6 g/dL — ABNORMAL LOW (ref 3.5–5.2)
Alkaline Phosphatase: 87 U/L (ref 39–117)
CO2: 27 mEq/L (ref 19–32)
Calcium: 8.8 mg/dL (ref 8.4–10.5)
Chloride: 103 mEq/L (ref 96–112)
Creatinine, Ser: 1.61 mg/dL — ABNORMAL HIGH (ref 0.50–1.35)
GFR calc non Af Amer: 38 mL/min — ABNORMAL LOW (ref >90)
Potassium: 3.7 mEq/L (ref 3.5–5.1)
Sodium: 140 mEq/L (ref 135–145)
Total Bilirubin: 0.5 mg/dL (ref 0.3–1.2)
Total Protein: 6 g/dL (ref 6.0–8.3)

## 2013-07-23 LAB — CBC
Hemoglobin: 11.9 g/dL — ABNORMAL LOW (ref 13.0–17.0)
MCH: 30.8 pg (ref 26.0–34.0)
MCHC: 32.6 g/dL (ref 30.0–36.0)
MCV: 94.6 fL (ref 78.0–100.0)
Platelets: 194 10*3/uL (ref 150–400)
RDW: 14.5 % (ref 11.5–15.5)

## 2013-07-23 MED ORDER — VANCOMYCIN HCL IN DEXTROSE 750-5 MG/150ML-% IV SOLN
750.0000 mg | INTRAVENOUS | Status: AC
  Administered 2013-07-23 – 2013-07-25 (×3): 750 mg via INTRAVENOUS
  Filled 2013-07-23 (×3): qty 150

## 2013-07-23 MED ORDER — LOSARTAN POTASSIUM 25 MG PO TABS
25.0000 mg | ORAL_TABLET | Freq: Every day | ORAL | Status: DC
  Administered 2013-07-23 – 2013-07-26 (×4): 25 mg via ORAL
  Filled 2013-07-23 (×4): qty 1

## 2013-07-23 NOTE — Evaluation (Signed)
Physical Therapy Evaluation Patient Details Name: Joel Walker MRN: [id number] DOB: [birth year] Today's Date: 07/23/2013 Time: 1331-1401 PT Time Calculation (min): 30 min  PT Assessment / Plan / Recommendation History of Present Illness  With past medical history of chronic systolic and diastolic heart failure with an ejection fraction of 30%, memory loss, diabetes with last hemoglobin A1c of 6.0, paroxysmal A. fib not on Coumadin due to noncompliance, also history of left apical thrombus. That comes in for right lower extremity swelling and redness. Has been going on for a week and today the pain was unbearable head and weeping exquisitely tender to touch. He denies any fever chills nausea or vomiting. And some mild chest pain that is relieved by nitroglycerin unchanged  Clinical Impression  Patient currently receiving 24/7 healthcare support in the home. At eval he demonstrated baseline competency in transfers and functional mobility with assistance and use of RW. Educated caregiver on safety and technique in transfers to decrease burden of care and requested she pass info to additional caregivers. Due to cognitive/memory deficits, further PT not recommended at this time.    PT Assessment  Patent does not need any further PT services (.)    Follow Up Recommendations  No PT follow up    Does the patient have the potential to tolerate intense rehabilitation      Barriers to Discharge        Equipment Recommendations       Recommendations for Other Services     Frequency      Precautions / Restrictions Precautions Precautions: Fall   Pertinent Vitals/Pain Denies any pain currently; all vital normal.       Mobility  Bed Mobility Bed Mobility: Supine to Sit;Sitting - Scoot to Edge of Bed Supine to Sit: 4: Min assist Sitting - Scoot to [place] of Bed: 4: Min guard Details for Bed Mobility Assistance: Pt needs assistance to bring trunk into upright  position. Transfers Transfers: Sit to Stand;Stand to Sit Sit to Stand: 4: Min assist;From bed;From chair/3-in-1 Stand to Sit: 4: Min assist;To bed;To chair/3-in-1 Details for Transfer Assistance: V/C to use UE for support Ambulation/Gait Ambulation/Gait Assistance: 4: Min assist Ambulation Distance (Feet): 30 Feet Assistive device: Rolling walker Gait Pattern: Step-to pattern;Decreased stride length General Gait Details: Cueing for posture; assist for safety for excessive lean side to side Stairs: No    Exercises     PT Diagnosis:    PT Problem List:   PT Treatment Interventions:       PT Goals(Current goals can be found in the care plan section) Acute Rehab PT Goals Patient Stated Goal: To go back home PT Goal Formulation: No goals set, d/c therapy  Visit Information  Last PT Received On: 07/23/13 Assistance Needed: +1 History of Present Illness: With past medical history of chronic systolic and diastolic heart failure with an ejection fraction of 30%, memory loss, diabetes with last hemoglobin A1c of 6.0, paroxysmal A. fib not on Coumadin due to noncompliance, also history of left apical thrombus. That comes in for right lower extremity swelling and redness. Has been going on for a week and today the pain was unbearable head and weeping exquisitely tender to touch. He denies any fever chills nausea or vomiting. And some mild chest pain that is relieved by nitroglycerin unchanged       Prior Functioning  Home Living Family/patient expects to be discharged to:: Private residence Living Arrangements: Non-relatives/Friends Available Help at Discharge: Personal care attendant;Available 24 hours/day Type of  Home: House Home Access: Stairs to enter [employer] of Steps: 4 Entrance Stairs-Rails: Left Home Layout: One level Home Equipment: Walker - 2 wheels;Wheelchair - manual;Cane - single point;Shower seat Additional Comments: pt doesn't get in tub. [place]ece next door  does dinner and aides do breakfast and lunch Prior Function Level of Independence: Needs assistance Gait / Transfers Assistance Needed: min assist transfers, walk with RW and assist with stairs ADL's / Homemaking Assistance Needed: max assist for bathing and dressing Communication Communication: HOH Dominant Hand: Right    Cognition  Cognition Arousal/Alertness: Awake/alert Behavior During Therapy: WFL for tasks assessed/performed Overall Cognitive Status: History of cognitive impairments - at baseline    Extremity/Trunk Assessment     Balance Balance Balance Assessed: Yes Static Standing Balance Static Standing - Balance Support: Bilateral upper extremity supported Static Standing - Level of Assistance: 4: Min assist Static Standing - Comment/# of Minutes: 6 (Pt stood for pericare due to incontinent BM;)  End of Session PT - End of Session Equipment Utilized During Treatment: Gait belt Activity Tolerance: Patient tolerated treatment well Patient left: in chair;with call bell/phone within reach Nurse Communication: Mobility status  GP     Willette Pa, SPT  07/23/2013, 2:32 PM

## 2013-07-23 NOTE — Progress Notes (Signed)
Patient evaluated for community based chronic disease management services with [facility] Care Management Program as a benefit of patient's [facility]  Medicare Insurance. Spoke with patient and paid companion Advice worker) at bedside to explain [facility] Care Management services.  He and his companion/sitter will relay the [facility] literature to [facility] (niece) for review.  Called Pam and obtained verbal consent for services.  Patient does not have a HCPOA.  Will request a inpatient LCSW referral for the same.  Patient will receive a post discharge transition of care call and will be evaluated for monthly home visits for assessments and CHF/PVD disease process education.  Left contact information and [facility] literature at bedside. Made Inpatient Case Manager aware that [facility] Care Management following. Of note, [facility] Care Management services does not replace or interfere with any services that are arranged by inpatient case management or social work.  For additional questions or referrals please contact Anibal Henderson BSN RN [facility] Liaison at [phone].

## 2013-07-23 NOTE — Evaluation (Signed)
Seen and agree with SPT note Dalten Ambrosino Tabor Oshen Wlodarczyk, PT [phone]  

## 2013-07-23 NOTE — Progress Notes (Signed)
OCCUPATIONAL THERAPY  Patient evaluated earlier by Physical Therapy and they are signing off with no further PT needs.  PT states that PTA patient had 24/7 pain attendant, cognitive issues and patient is currently at baseline with no further acute therapy needs identified. OT is signing off. Thank you for this referral.  Pleas Patricia, MS, OTR/L Occupational Therapist Pager [phone]

## 2013-07-23 NOTE — Progress Notes (Signed)
[facility] PROGRESS NOTE Interim History: chronic systolic and diastolic heart failure with an ejection fraction of 30%, memory loss, diabetes with last hemoglobin A1c of 6.0, paroxysmal A. fib not on Coumadin due to noncompliance, also history of left apical thrombus. That comes in for right lower extremity swelling and redness. Has been going on for a week and today the pain was unbearable head and weeping exquisitely tender to touch   Assessment/Plan: Cellulitis of right foot - WOC, cont vanc. Doubt BC will be positive. - afebrile, no leukocytosis.   Paroxysmal atrial fibrillation - Continue amiodarone and aspirin.  - EKG shows Sinus rhythm.  - First set of  cardiac enzymes is negative EKG is unchanged  Diabetes mellitus: - good control just on lantus, monitor  Chronic systolic heart failure - cont lasix, not on betablocker or ACE-I ( has cough with ACE). - not a good candidate for Beta blocker HR 70-60's.  RENAL DISEASE, CHRONIC, STAGE III/diabetic nephropathy: - low dose ARB    Code Status: full  Family Communication: sister  Disposition Plan: inpatient    Consultants:  none  Procedures:  Lower extremity doppler  Antibiotics:  vanc 11.17.2014  HPI/Subjective: No complains.  Objective: Filed Vitals:   07/22/13 1500 07/22/13 1602 07/22/13 2031 07/23/13 0604  BP: 117/58 151/55 138/67 149/71  Pulse: 70 68 70 64  Temp:  97.3 F (36.3 C) 98.1 F (36.7 C) 97.9 F (36.6 C)  TempSrc:  Oral Oral Oral  Resp: 19 19 17 18   Height:  5\' 10"  (1.778 m)    Weight:  76.7 kg (169 lb 1.5 oz)  76 kg (167 lb 8.8 oz)  SpO2: 100% 99% 99% 98%    Intake/Output Summary (Last 24 hours) at 07/23/13 0734 Last data filed at 07/23/13 4098  Gross per 24 hour  Intake    360 ml  Output   1200 ml  Net   -840 ml   Filed Weights   07/22/13 1201 07/22/13 1602 07/23/13 0604  Weight: 73.936 kg (163 lb) 76.7 kg (169 lb 1.5 oz) 76 kg (167 lb 8.8 oz)    Exam:  General:  Alert, awake, oriented x3, in no acute distress.  HEENT: No bruits, no goiter.  Heart: Regular rate and rhythm, without murmurs, rubs, gallops.  Lungs: Good air movement, clear to auscultation Abdomen: Soft, nontender, nondistended, positive bowel sounds.  Neuro: Grossly intact, nonfocal.   Data Reviewed: Basic Metabolic Panel:  Recent Labs Lab 07/22/13 1245 07/22/13 1700 07/23/13 0530  NA 138  --  140  K 4.1  --  3.7  CL 100  --  103  CO2 28  --  27  GLUCOSE 106*  --  74  BUN 17  --  19  CREATININE 1.52* 1.44* 1.61*  CALCIUM 9.5  --  8.8   Liver Function Tests:  Recent Labs Lab 07/23/13 0530  AST 18  ALT 13  ALKPHOS 87  BILITOT 0.5  PROT 6.0  ALBUMIN 2.6*   No results found for this basename: LIPASE, AMYLASE,  in the last 168 hours No results found for this basename: AMMONIA,  in the last 168 hours CBC:  Recent Labs Lab 07/22/13 1245 07/22/13 1700 07/23/13 0530  WBC 7.7 8.6 8.1  NEUTROABS 4.8  --   --   HGB 12.7* 12.5* 11.9*  HCT 38.6* 38.0* 36.5*  MCV 95.5 95.0 94.6  PLT 199 196 194   Cardiac Enzymes:  Recent Labs Lab 07/22/13 1245  TROPONINI <0.30  BNP (last 3 results)  Recent Labs  12/06/12 1051 12/08/12 0653 02/06/13 1900  PROBNP 5232.0* 8353.0* 2842.0*   CBG:  Recent Labs Lab 07/22/13 1555 07/22/13 2142  GLUCAP 113* 119*    No results found for this or any previous visit (from the past 240 hour(s)).   Studies: Dg Chest 2 View  07/22/2013   CLINICAL DATA:  Chest pain, shortness of breath  EXAM: CHEST  2 VIEW  COMPARISON:  02/06/2013  FINDINGS: Low lung volumes. Normal heart size and vascularity. Minor basilar densities, suspect atelectasis. No definite CHF or pneumonia. Negative for effusion or pneumothorax. Atherosclerosis of the aorta evident. Degenerative changes of the spine and shoulders. Prior cholecystectomy evident.  IMPRESSION: Stable low volume chest exam.  No superimposed acute process   Electronically Signed   By:  Ruel Favors M.D.   On: 07/22/2013 13:11    Scheduled Meds: . amiodarone  100 mg Oral Daily  . aspirin EC  81 mg Oral Daily  . furosemide  40 mg Oral Daily  . heparin  5,000 Units Subcutaneous Q8H  . insulin glargine  5 Units Subcutaneous QHS  . isosorbide mononitrate  30 mg Oral Daily  . levothyroxine  50 mcg Oral QAC breakfast  . pantoprazole  40 mg Oral Daily  . sodium chloride  3 mL Intravenous Q12H  . vancomycin  1,250 mg Intravenous Q24H   Continuous Infusions:    Marinda Elk  [facility] Pager [phone]. If 8PM-8AM, please contact night-coverage at [email or web address], password [facility] 07/23/2013, 7:34 AM  LOS: 1 day

## 2013-07-24 LAB — GLUCOSE, CAPILLARY: Glucose-Capillary: 172 mg/dL — ABNORMAL HIGH (ref 70–99)

## 2013-07-24 MED ORDER — RISPERIDONE 0.5 MG PO TABS
0.5000 mg | ORAL_TABLET | Freq: Every day | ORAL | Status: DC
  Administered 2013-07-24 – 2013-07-25 (×2): 0.5 mg via ORAL
  Filled 2013-07-24 (×4): qty 1

## 2013-07-24 NOTE — Progress Notes (Signed)
[facility] PROGRESS NOTE Interim History: chronic systolic and diastolic heart failure with an ejection fraction of 30%, memory loss, diabetes with last hemoglobin A1c of 6.0, paroxysmal A. fib not on Coumadin due to noncompliance, also history of left apical thrombus. That comes in for right lower extremity swelling and redness. Has been going on for a week and today the pain was unbearable head and weeping exquisitely tender to touch   Assessment/Plan: Cellulitis of right foot - WOC, cont vanc. 11.16.2014. Doubt BC will be positive. - afebrile, no leukocytosis. Only mildly improved   Paroxysmal atrial fibrillation - Continue amiodarone and aspirin.  - EKG shows Sinus rhythm.   Diabetes mellitus: - good control just on lantus, monitor  Chronic systolic heart failure - cont lasix, not on betablocker or ACE-I ( has cough with ACE). - not a good candidate for Beta blocker HR 70-60's.  RENAL DISEASE, CHRONIC, STAGE III/diabetic nephropathy: - low dose ARB   Delirium: - risperidone low dose bedtime.  Code Status: full  Family Communication: sister  Disposition Plan: inpatient    Consultants:  none  Procedures:  Lower extremity doppler  Antibiotics:  vanc 11.17.2014  HPI/Subjective: No complains.  Objective: Filed Vitals:   07/23/13 0604 07/23/13 1500 07/23/13 2042 07/24/13 0544  BP: 149/71 93/46 95/55  127/62  Pulse: 64 85 67 72  Temp: 97.9 F (36.6 C) 98.8 F (37.1 C) 97.9 F (36.6 C) 97.5 F (36.4 C)  TempSrc: Oral Oral Oral Oral  Resp: 18 18 18 18   Height:      Weight: 76 kg (167 lb 8.8 oz)   75.796 kg (167 lb 1.6 oz)  SpO2: 98% 97% 98% 100%    Intake/Output Summary (Last 24 hours) at 07/24/13 0942 Last data filed at 07/24/13 0434  Gross per 24 hour  Intake   1110 ml  Output    860 ml  Net    250 ml   Filed Weights   07/22/13 1602 07/23/13 0604 07/24/13 0544  Weight: 76.7 kg (169 lb 1.5 oz) 76 kg (167 lb 8.8 oz) 75.796 kg (167 lb 1.6  oz)    Exam:  General: Alert, awake, oriented x3, in no acute distress.  HEENT: No bruits, no goiter.  Heart: Regular rate and rhythm, without murmurs, rubs, gallops.  Lungs: Good air movement, clear to auscultation Abdomen: Soft, nontender, nondistended, positive bowel sounds.  Skin: slight impovement in redness still swollen   Data Reviewed: Basic Metabolic Panel:  Recent Labs Lab 07/22/13 1245 07/22/13 1700 07/23/13 0530  NA 138  --  140  K 4.1  --  3.7  CL 100  --  103  CO2 28  --  27  GLUCOSE 106*  --  74  BUN 17  --  19  CREATININE 1.52* 1.44* 1.61*  CALCIUM 9.5  --  8.8   Liver Function Tests:  Recent Labs Lab 07/23/13 0530  AST 18  ALT 13  ALKPHOS 87  BILITOT 0.5  PROT 6.0  ALBUMIN 2.6*   No results found for this basename: LIPASE, AMYLASE,  in the last 168 hours No results found for this basename: AMMONIA,  in the last 168 hours CBC:  Recent Labs Lab 07/22/13 1245 07/22/13 1700 07/23/13 0530  WBC 7.7 8.6 8.1  NEUTROABS 4.8  --   --   HGB 12.7* 12.5* 11.9*  HCT 38.6* 38.0* 36.5*  MCV 95.5 95.0 94.6  PLT 199 196 194   Cardiac Enzymes:  Recent Labs Lab 07/22/13 1245  TROPONINI <0.30   BNP (last 3 results)  Recent Labs  12/06/12 1051 12/08/12 0653 02/06/13 1900  PROBNP 5232.0* 8353.0* 2842.0*   CBG:  Recent Labs Lab 07/22/13 2142 07/23/13 0719 07/23/13 1054 07/23/13 1626 07/23/13 2039  GLUCAP 119* 79 126* 188* 124*    Recent Results (from the past 240 hour(s))  CULTURE, BLOOD (ROUTINE X 2)     Status: None   Collection Time    07/22/13  5:15 PM      Result Value Range Status   Specimen Description BLOOD LEFT ARM   Final   Special Requests BOTTLES DRAWN AEROBIC ONLY 2CC   Final   Culture  Setup Time     Final   Value: 07/23/2013 04:30     Performed at [facility]   Culture     Final   Value:        BLOOD CULTURE RECEIVED NO GROWTH TO DATE CULTURE WILL BE HELD FOR 5 DAYS BEFORE ISSUING A FINAL NEGATIVE REPORT      Performed at [facility]   Report Status PENDING   Incomplete  CULTURE, BLOOD (ROUTINE X 2)     Status: None   Collection Time    07/22/13  5:21 PM      Result Value Range Status   Specimen Description BLOOD RIGHT HAND   Final   Special Requests BOTTLES DRAWN AEROBIC ONLY 1CC   Final   Culture  Setup Time     Final   Value: 07/23/2013 04:30     Performed at [facility]   Culture     Final   Value:        BLOOD CULTURE RECEIVED NO GROWTH TO DATE CULTURE WILL BE HELD FOR 5 DAYS BEFORE ISSUING A FINAL NEGATIVE REPORT     Performed at [facility]   Report Status PENDING   Incomplete     Studies: Dg Chest 2 View  07/22/2013   CLINICAL DATA:  Chest pain, shortness of breath  EXAM: CHEST  2 VIEW  COMPARISON:  02/06/2013  FINDINGS: Low lung volumes. Normal heart size and vascularity. Minor basilar densities, suspect atelectasis. No definite CHF or pneumonia. Negative for effusion or pneumothorax. Atherosclerosis of the aorta evident. Degenerative changes of the spine and shoulders. Prior cholecystectomy evident.  IMPRESSION: Stable low volume chest exam.  No superimposed acute process   Electronically Signed   By: Ruel Favors M.D.   On: 07/22/2013 13:11    Scheduled Meds: . amiodarone  100 mg Oral Daily  . aspirin EC  81 mg Oral Daily  . furosemide  40 mg Oral Daily  . heparin  5,000 Units Subcutaneous Q8H  . insulin glargine  5 Units Subcutaneous QHS  . isosorbide mononitrate  30 mg Oral Daily  . levothyroxine  50 mcg Oral QAC breakfast  . losartan  25 mg Oral Daily  . pantoprazole  40 mg Oral Daily  . sodium chloride  3 mL Intravenous Q12H  . vancomycin  750 mg Intravenous Q24H   Continuous Infusions:    Marinda Elk  [facility] Pager [phone]. If 8PM-8AM, please contact night-coverage at [email or web address], password [facility] 07/24/2013, 9:42 AM  LOS: 2 days

## 2013-07-25 DIAGNOSIS — L02419 Cutaneous abscess of limb, unspecified: Secondary | ICD-10-CM

## 2013-07-25 LAB — GLUCOSE, CAPILLARY: Glucose-Capillary: 138 mg/dL — ABNORMAL HIGH (ref 70–99)

## 2013-07-25 MED ORDER — DOXYCYCLINE HYCLATE 100 MG PO TABS
100.0000 mg | ORAL_TABLET | Freq: Two times a day (BID) | ORAL | Status: DC
  Administered 2013-07-25 – 2013-07-26 (×3): 100 mg via ORAL
  Filled 2013-07-25 (×4): qty 1

## 2013-07-25 MED ORDER — NEBIVOLOL HCL 2.5 MG PO TABS
2.5000 mg | ORAL_TABLET | Freq: Every day | ORAL | Status: DC
  Administered 2013-07-25 – 2013-07-26 (×2): 2.5 mg via ORAL
  Filled 2013-07-25 (×2): qty 1

## 2013-07-25 NOTE — Progress Notes (Signed)
ANTIBIOTIC CONSULT NOTE - Follow Up  Pharmacy Consult for Vancomycin Indication: cellulitis  Allergies  Allergen Reactions  . Lisinopril     REACTION: Cough. Unsure if this is a true allergy. Pt takes this med everyday with out any known side effects per family.    Patient Measurements: Height: 5\' 10"  (177.8 cm) Weight:  (pt refused and was combative) IBW/kg (Calculated) : 73  Labs:  Recent Labs  07/22/13 1245 07/22/13 1700 07/23/13 0530  WBC 7.7 8.6 8.1  HGB 12.7* 12.5* 11.9*  PLT 199 196 194  CREATININE 1.52* 1.44* 1.61*   Estimated Creatinine Clearance: 37.2 ml/min (by C-G formula based on Cr of 1.61).  Microbiology: Recent Results (from the past 720 hour(s))  CULTURE, BLOOD (ROUTINE X 2)     Status: None   Collection Time    07/22/13  5:15 PM      Result Value Range Status   Specimen Description BLOOD LEFT ARM   Final   Special Requests BOTTLES DRAWN AEROBIC ONLY 2CC   Final   Culture  Setup Time     Final   Value: 07/23/2013 04:30     Performed at [facility]   Culture     Final   Value:        BLOOD CULTURE RECEIVED NO GROWTH TO DATE CULTURE WILL BE HELD FOR 5 DAYS BEFORE ISSUING A FINAL NEGATIVE REPORT     Performed at [facility]   Report Status PENDING   Incomplete  CULTURE, BLOOD (ROUTINE X 2)     Status: None   Collection Time    07/22/13  5:21 PM      Result Value Range Status   Specimen Description BLOOD RIGHT HAND   Final   Special Requests BOTTLES DRAWN AEROBIC ONLY 1CC   Final   Culture  Setup Time     Final   Value: 07/23/2013 04:30     Performed at [facility]   Culture     Final   Value:        BLOOD CULTURE RECEIVED NO GROWTH TO DATE CULTURE WILL BE HELD FOR 5 DAYS BEFORE ISSUING A FINAL NEGATIVE REPORT     Performed at [facility]   Report Status PENDING   Incomplete    Medical History: Past Medical History  Diagnosis Date  . SYSTOLIC HEART FAILURE, ACUTE   . PULMONARY HYPERTENSION   . PAROXYSMAL  ATRIAL FIBRILLATION   . HYPERTENSION   . DVT   . CARDIOMYOPATHY   . CAD, NATIVE VESSEL   . TOBACCO USE, QUIT   . RENAL DISEASE, CHRONIC, STAGE III   . MURAL THROMBUS, APEX OF HEART   . Long term current use of anticoagulant   . HYPOKALEMIA   . DM   . ANEMIA, IRON DEFICIENCY   . CHF (congestive heart failure)   . Senile cataract, unspecified   . Other specified disease of nail   . Unspecified hearing loss   . Chronic kidney disease, stage II (mild)   . Memory loss   . Unspecified urinary incontinence   . Abnormality of gait   . Unspecified hypothyroidism   . Unspecified vitamin D deficiency   . Anemia, unspecified   . Coronary atherosclerosis of native coronary artery   . Acute systolic heart failure   . Primary pulmonary hypertension   . Cardiomyopathy in other diseases classified elsewhere   . Other seborrheic dermatitis   . Other and unspecified angina pectoris   .  Other and unspecified hyperlipidemia   . Slowing of urinary stream   . Type II or unspecified type diabetes mellitus without mention of complication, not stated as uncontrolled   . Unspecified essential hypertension   . Esophageal reflux   . Edema   . Shortness of breath   . Hydrocele, unspecified   . Impotence of organic origin   . Edema    Assessment: 79 year old to continue Vancomycin for cellulitis.  Scr = 1.61  Goal of Therapy:  Vancomycin trough level 10-15 mcg/ml  Plan:  1) Vancomycin 750 mg iv Q 24 hours 2) Follow up Scr, cultures, fever curve  Thank you. Talbert Cage P[place]et 07/25/2013,8:21 AM

## 2013-07-25 NOTE — Plan of Care (Signed)
Problem: Phase I Progression Outcomes Goal: Initial discharge plan identified Outcome: Completed/Met Date Met:  07/25/13 discharge home with 24/7 private duty sitters

## 2013-07-25 NOTE — Progress Notes (Signed)
[facility] PROGRESS NOTE Assessment/Plan:  Cellulitis of right foot - Will follow WOC rec's -will give 1 more day of IV antibiotics and then switch to PO doxycycline to complete a total of 10 days treatment -caregiver educated regarding physical measures and leg elevation to help with swelling. - afebrile, no leukocytosis.  -leg is significantly improved; most likely home in am.   Paroxysmal atrial fibrillation - Continue amiodarone and aspirin.  -not a candidate for chronic anticoagulation given fall risks and non-compliance due to memory issues. - EKG and telemetry shows Sinus rhythm.   Diabetes mellitus: -Good control just on lantus, continue monitoring CBG's  Chronic systolic heart failure - cont lasix -will start low dose bystolic to provide B-blocker therapy -no ACE due to cough with ACE and renal failure -Patient instructed on low sodium diet and fluid restriction  RENAL DISEASE, CHRONIC, STAGE III/diabetic nephropathy: - will continue low dose ARB   Delirium/depression: - continue risperdal and remeron at current dose  Code Status: full  Family Communication: sister  Disposition Plan: will discharge home when medically stable.   Consultants:  none  Procedures:  Lower extremity doppler  Antibiotics:  vanc 11.17.2014  HPI/Subjective: No complains. Feeling better. RLE is less swollen and with decrease redness.  Objective: Filed Vitals:   07/24/13 0544 07/24/13 1337 07/24/13 2033 07/25/13 0631  BP: 127/62 89/46 127/50 144/62  Pulse: 72 80 70 69  Temp: 97.5 F (36.4 C) 98.8 F (37.1 C) 98.5 F (36.9 C) 97.8 F (36.6 C)  TempSrc: Oral Oral Oral Oral  Resp: 18 17 18 18   Height:      Weight: 75.796 kg (167 lb 1.6 oz)     SpO2: 100% 96% 99% 100%    Intake/Output Summary (Last 24 hours) at 07/25/13 1321 Last data filed at 07/25/13 0902  Gross per 24 hour  Intake   1090 ml  Output    390 ml  Net    700 ml   Filed Weights   07/22/13 1602  07/23/13 0604 07/24/13 0544  Weight: 76.7 kg (169 lb 1.5 oz) 76 kg (167 lb 8.8 oz) 75.796 kg (167 lb 1.6 oz)    Exam:  General: Alert, awake, oriented x3, in no acute distress.  HEENT: No bruits, no goiter.  Heart: Regular rate and rhythm, without murmurs, rubs, gallops.  Lungs: Good air movement, clear to auscultation Abdomen: Soft, nontender, nondistended, positive bowel sounds.  Skin: impovement in RLE redness and swelling    Data Reviewed: Basic Metabolic Panel:  Recent Labs Lab 07/22/13 1245 07/22/13 1700 07/23/13 0530  NA 138  --  140  K 4.1  --  3.7  CL 100  --  103  CO2 28  --  27  GLUCOSE 106*  --  74  BUN 17  --  19  CREATININE 1.52* 1.44* 1.61*  CALCIUM 9.5  --  8.8   Liver Function Tests:  Recent Labs Lab 07/23/13 0530  AST 18  ALT 13  ALKPHOS 87  BILITOT 0.5  PROT 6.0  ALBUMIN 2.6*   CBC:  Recent Labs Lab 07/22/13 1245 07/22/13 1700 07/23/13 0530  WBC 7.7 8.6 8.1  NEUTROABS 4.8  --   --   HGB 12.7* 12.5* 11.9*  HCT 38.6* 38.0* 36.5*  MCV 95.5 95.0 94.6  PLT 199 196 194   Cardiac Enzymes:  Recent Labs Lab 07/22/13 1245  TROPONINI <0.30   BNP (last 3 results)  Recent Labs  12/06/12 1051 12/08/12 0653 02/06/13 1900  PROBNP 5232.0* 8353.0* 2842.0*   CBG:  Recent Labs Lab 07/23/13 0719 07/23/13 1054 07/23/13 1626 07/23/13 2039 07/24/13 2122  GLUCAP 79 126* 188* 124* 172*    Recent Results (from the past 240 hour(s))  CULTURE, BLOOD (ROUTINE X 2)     Status: None   Collection Time    07/22/13  5:15 PM      Result Value Range Status   Specimen Description BLOOD LEFT ARM   Final   Special Requests BOTTLES DRAWN AEROBIC ONLY 2CC   Final   Culture  Setup Time     Final   Value: 07/23/2013 04:30     Performed at [facility]   Culture     Final   Value:        BLOOD CULTURE RECEIVED NO GROWTH TO DATE CULTURE WILL BE HELD FOR 5 DAYS BEFORE ISSUING A FINAL NEGATIVE REPORT     Performed at [facility]    Report Status PENDING   Incomplete  CULTURE, BLOOD (ROUTINE X 2)     Status: None   Collection Time    07/22/13  5:21 PM      Result Value Range Status   Specimen Description BLOOD RIGHT HAND   Final   Special Requests BOTTLES DRAWN AEROBIC ONLY 1CC   Final   Culture  Setup Time     Final   Value: 07/23/2013 04:30     Performed at [facility]   Culture     Final   Value:        BLOOD CULTURE RECEIVED NO GROWTH TO DATE CULTURE WILL BE HELD FOR 5 DAYS BEFORE ISSUING A FINAL NEGATIVE REPORT     Performed at [facility]   Report Status PENDING   Incomplete     Studies: No results found.  Scheduled Meds: . amiodarone  100 mg Oral Daily  . aspirin EC  81 mg Oral Daily  . doxycycline  100 mg Oral Q12H  . furosemide  40 mg Oral Daily  . heparin  5,000 Units Subcutaneous Q8H  . insulin glargine  5 Units Subcutaneous QHS  . isosorbide mononitrate  30 mg Oral Daily  . levothyroxine  50 mcg Oral QAC breakfast  . losartan  25 mg Oral Daily  . pantoprazole  40 mg Oral Daily  . risperiDONE  0.5 mg Oral QHS  . sodium chloride  3 mL Intravenous Q12H  . vancomycin  750 mg Intravenous Q24H   Continuous Infusions:    Rokia Bosket  [facility] Pager [phone]. If 8PM-8AM, please contact night-coverage at [email or web address], password [facility] 07/25/2013, 1:21 PM  LOS: 3 days

## 2013-07-26 DIAGNOSIS — E039 Hypothyroidism, unspecified: Secondary | ICD-10-CM

## 2013-07-26 DIAGNOSIS — K219 Gastro-esophageal reflux disease without esophagitis: Secondary | ICD-10-CM

## 2013-07-26 LAB — BASIC METABOLIC PANEL
BUN: 29 mg/dL — ABNORMAL HIGH (ref 6–23)
CO2: 29 mEq/L (ref 19–32)
GFR calc Af Amer: 32 mL/min — ABNORMAL LOW (ref >90)
GFR calc non Af Amer: 28 mL/min — ABNORMAL LOW (ref >90)
Potassium: 4.3 mEq/L (ref 3.5–5.1)

## 2013-07-26 LAB — GLUCOSE, CAPILLARY

## 2013-07-26 MED ORDER — LOSARTAN POTASSIUM 25 MG PO TABS: 12.5000 mg | ORAL_TABLET | Freq: Every day | ORAL | Status: DC

## 2013-07-26 MED ORDER — NEBIVOLOL HCL 2.5 MG PO TABS: 2.5000 mg | ORAL_TABLET | Freq: Every day | ORAL | Status: DC

## 2013-07-26 MED ORDER — RISPERIDONE 0.5 MG PO TABS: 0.5000 mg | ORAL_TABLET | Freq: Every day | ORAL | Status: DC

## 2013-07-26 MED ORDER — DOXYCYCLINE HYCLATE 100 MG PO TABS: 100.0000 mg | ORAL_TABLET | Freq: Two times a day (BID) | ORAL | Status: AC

## 2013-07-26 NOTE — Care Management Note (Addendum)
  Page 1 of 1   07/26/2013     2:11:01 PM   CARE MANAGEMENT NOTE 07/26/2013  Patient:  Joel Walker, Joel Walker   Account Number:  [id number]  Date Initiated:  07/26/2013  Documentation initiated by:  Oletta Cohn  Subjective/Objective Assessment:   79 yo male Chief Complaint: right lower extremity swelling//Home with sitter     Action/Plan:   Chief Complaint: right lower extremity swelling   Anticipated DC Date:  07/26/2013   Anticipated DC Plan:  HOME/SELF CARE      DC Planning Services  CM consult      Choice offered to / List presented to:     DME arranged  HOSPITAL BED      DME agency  [facility]        Status of service:   Medicare Important Message given?   (If response is "NO", the following Medicare IM given date fields will be blank) Date Medicare IM given:   Date Additional Medicare IM given:    Discharge Disposition:    Per UR Regulation:    If discussed at Long Length of Stay Meetings, dates discussed:    Comments:  07/26/13 1150 Heath Badon, RN, BSN, [place] [phone]   Patient has (list medical condition): CHF   The above medical condition requires body part(s) to be positioned in ways not feasible with a normal bed: (list body part(s)) Head; Head must be elevated at least: 30 degrees;  Bed type Electric

## 2013-07-26 NOTE — Progress Notes (Signed)
CSW was notified by patient's nurse to talk to patient and family prior to d/c home today as there is a conflict regarding patient's d/c papers.  Patient is currently receiving 24 hour care through [facility].  His caregiver was in room at time of d/c and patient requested that his d/c papers be given to the caregiver. Patient's sister Delray Alt, who states she is the one who arranged for the service- wanted a copy of patient's discharge papers but she does not have Power of Attorney (there is no HCPOA).  Patient is alert but has some periods of confusion; he has not been adjudicated incompetent.  CSW spoke to patient and asked him if he wanted his sister to receive a copy of his discharge paper work.  Patient adamantly stated "NO";  CSW asked him again with same response.  The caregiver Angela-representing [facility] stated that she could not give sister a copy of the d/c papers as it would be a HIPPA violation.  Caregiver states that this issue continues to come up where his sister wants access to his medical records and he has always stated no.  The caregiver related that the discharge papers will be at the house and that he wants his niece who lives next door to have the discharge paper work. She also provides care giving duties to patient. Patient d/c home after above discussion with sister, patient and caregiver.  Discussed with sister that she may have to look into the possibility of seeking guardianship if she felt that her brother was no longer capable of making his own decisions.  At this point however- he was able to reply to CSW's questions and appeared to fully verbalize that he did not want her to have access to his records.  No further CSW needs identified.  CSW signing off.   Lorri Frederick. [place]  [phone]

## 2013-07-26 NOTE — Discharge Summary (Signed)
Physician Discharge Summary  Joel Walker WUJ:[id number] DOB: 01-05-1930 DOA: 07/22/2013  PCP: Kimber Relic, MD  Admit date: 07/22/2013 Discharge date: 07/26/2013  Time spent: >30 minutes  Recommendations for Outpatient Follow-up:  1. BMET to follow renal function and electrolytes 2. Reassess BP and adjust medications as needed  Discharge Diagnoses:  Principal Problem:   Cellulitis of right foot Active Problems:   RENAL DISEASE, CHRONIC, STAGE III   Chronic systolic heart failure   Diabetes mellitus   Paroxysmal atrial fibrillation   Diabetic nephropathy GERD CAD  Discharge Condition: stable and improved. Will discharge home with private [facility] 24/7 caregivers.  Diet recommendation: Heart healthy diet  Filed Weights   07/23/13 0604 07/24/13 0544 07/26/13 0626  Weight: 76 kg (167 lb 8.8 oz) 75.796 kg (167 lb 1.6 oz) 76.93 kg (169 lb 9.6 oz)    History of present illness:  79 y.o. male  With past medical history of chronic systolic and diastolic heart failure with an ejection fraction of 30%, memory loss, diabetes with last hemoglobin A1c of 6.0, paroxysmal A. fib not on Coumadin due to noncompliance, also history of left apical thrombus. That comes in for right lower extremity swelling and redness. Has been going on for a week and today the pain was unbearable head and weeping exquisitely tender to touch. He denies any fever chills nausea or vomiting. And some mild chest pain that is relieved by nitroglycerin unchanged. Has been recently treated for with doxycycline for bursitis for one week  Hospital Course:  Cellulitis of right foot  - will switch antibiotics to PO doxycycline to complete a total of 10 days treatment  -caregiver educated regarding physical measures and leg elevation to help with swelling.  -afebrile, no leukocytosis.  -leg is significantly improved -patient will benefit of stockings or if needed unna boot for underlying venous stasis   Paroxysmal  atrial fibrillation  -Continue amiodarone and aspirin.  -not a candidate for chronic anticoagulation given fall risks and non-compliance due to memory issues.  - EKG and telemetry shows Sinus rhythm.   Diabetes mellitus:  -Good control; will continue home regimen and patient will follow with PCP for further hypoglycemic regimen adjustments as needed   Chronic systolic heart failure  -cont lasix  -will discharge on low dose bystolic to provide B-blocker therapy and continue low dose ARB -no ACE due to cough with ACE  -Patient instructed on low sodium diet and fluid restriction   RENAL DISEASE, CHRONIC, STAGE III/diabetic nephropathy:  - will continue low dose ARB  -Cr at baseline -close follow up after discharge  GERD -continue PPI  Delirium/depression:  - continue risperdal and remeron at current dose  CAD -no CP -continue ASA, b-blocker and nitrates therapy  Dementia: -continue donepexil   Procedures: RLE venous duplex: no obvious DVT or SVT noted in the right lower extremity.  Consultations:  None   Discharge Exam: Filed Vitals:   07/26/13 0626  BP: 131/66  Pulse: 62  Temp: 97.2 F (36.2 C)  Resp: 18   General: Alert, awake, oriented x3, in no acute distress.  HEENT: No bruits, no goiter.  Heart: Regular rate and rhythm, without murmurs, rubs, gallops.  Lungs: Good air movement, clear to auscultation  Abdomen: Soft, nontender, nondistended, positive bowel sounds.  Skin: impovement in RLE redness and swelling    Discharge Instructions  Discharge Orders   Future Appointments Provider Department Dept Phone   08/22/2013 3:00 PM Kimber Relic, MD [facility] [phone]  Future Orders Complete By Expires   Discharge instructions  As directed    Comments:     Take medications as prescribed Arrange follow up with PCP in 1 week Please restrict fluid intake to 1.5-2 L in 24 hours Follow a low sodium diet (Less than 2,000 mg daily) Keep legs  elevated as much as possible, keep them well moisture and avoid scratching or picking on your legs (to avoid skin breakdowns)   DME Hospital bed  As directed    Questions:     Patient has (list medical condition):  patient with GERD and also chronic systolic heart failure   The above medical condition requires body part(s) to be positioned in ways not feasible with a normal bed: (list body part(s)):  needs to keep head elevated to prevent symptoms   Head must be elevated at least:  30 degrees   Bed type:  Semi-electric       Medication List         amiodarone 200 MG tablet  Commonly known as:  PACERONE  Take 0.5 tablets (100 mg total) by mouth daily.     aspirin EC 81 MG tablet  Take 81 mg by mouth daily.     donepezil 5 MG tablet  Commonly known as:  ARICEPT  TAKE 1 TABLET BY MOUTH AT BEDTIME     doxycycline 100 MG tablet  Commonly known as:  VIBRA-TABS  Take 1 tablet (100 mg total) by mouth every 12 (twelve) hours.     furosemide 40 MG tablet  Commonly known as:  LASIX  Take 40 mg by mouth daily. One daily to prevent fluid retention in the morning.     HYDROcodone-acetaminophen 5-325 MG per tablet  Commonly known as:  NORCO/VICODIN  One tablet every 6 hours if needed for pain     insulin glargine 100 UNIT/ML injection  Commonly known as:  LANTUS  Inject 5 Units into the skin at bedtime.     Insulin Pen Needle 31G X 8 MM Misc  Commonly known as:  B-D ULTRAFINE III SHORT PEN  Use as Directed. DX: 250.00     isosorbide mononitrate 30 MG 24 hr tablet  Commonly known as:  IMDUR  TAKE 1 TABLET EVERY DAY     levothyroxine 50 MCG tablet  Commonly known as:  SYNTHROID, LEVOTHROID  Take 1 tablet (50 mcg total) by mouth daily.     losartan 25 MG tablet  Commonly known as:  COZAAR  Take 0.5 tablets (12.5 mg total) by mouth daily.     mirtazapine 15 MG tablet  Commonly known as:  REMERON  One at bedtime to help stimulate appetite     nebivolol 2.5 MG tablet  Commonly  known as:  BYSTOLIC  Take 1 tablet (2.5 mg total) by mouth daily.     nitroGLYCERIN 0.4 MG SL tablet  Commonly known as:  NITROSTAT  Place 1 tablet (0.4 mg total) under the tongue every 5 (five) minutes as needed for chest pain.     omeprazole 40 MG capsule  Commonly known as:  PRILOSEC  Take 1 capsule (40 mg total) by mouth daily.     risperiDONE 0.5 MG tablet  Commonly known as:  RISPERDAL  Take 1 tablet (0.5 mg total) by mouth at bedtime.     traMADol 50 MG tablet  Commonly known as:  ULTRAM  Take 1 tablet (50 mg total) by mouth every 6 (six) hours as needed for pain.  Allergies  Allergen Reactions  . Lisinopril     REACTION: Cough. Unsure if this is a true allergy. Pt takes this med everyday with out any known side effects per family.       Follow-up Information   Follow up with GREEN, Lenon Curt, MD. Schedule an appointment as soon as possible for a visit in 1 week.   Specialty:  Internal Medicine   Contact information:   [address] [place] [address] [phone]        The results of significant diagnostics from this hospitalization (including imaging, microbiology, ancillary and laboratory) are listed below for reference.    Significant Diagnostic Studies: Dg Chest 2 View  07/22/2013   CLINICAL DATA:  Chest pain, shortness of breath  EXAM: CHEST  2 VIEW  COMPARISON:  02/06/2013  FINDINGS: Low lung volumes. Normal heart size and vascularity. Minor basilar densities, suspect atelectasis. No definite CHF or pneumonia. Negative for effusion or pneumothorax. Atherosclerosis of the aorta evident. Degenerative changes of the spine and shoulders. Prior cholecystectomy evident.  IMPRESSION: Stable low volume chest exam.  No superimposed acute process   Electronically Signed   By: Ruel Favors M.D.   On: 07/22/2013 13:11    Microbiology: Recent Results (from the past 240 hour(s))  CULTURE, BLOOD (ROUTINE X 2)     Status: None   Collection Time     07/22/13  5:15 PM      Result Value Range Status   Specimen Description BLOOD LEFT ARM   Final   Special Requests BOTTLES DRAWN AEROBIC ONLY 2CC   Final   Culture  Setup Time     Final   Value: 07/23/2013 04:30     Performed at [facility]   Culture     Final   Value:        BLOOD CULTURE RECEIVED NO GROWTH TO DATE CULTURE WILL BE HELD FOR 5 DAYS BEFORE ISSUING A FINAL NEGATIVE REPORT     Performed at [facility]   Report Status PENDING   Incomplete  CULTURE, BLOOD (ROUTINE X 2)     Status: None   Collection Time    07/22/13  5:21 PM      Result Value Range Status   Specimen Description BLOOD RIGHT HAND   Final   Special Requests BOTTLES DRAWN AEROBIC ONLY 1CC   Final   Culture  Setup Time     Final   Value: 07/23/2013 04:30     Performed at [facility]   Culture     Final   Value:        BLOOD CULTURE RECEIVED NO GROWTH TO DATE CULTURE WILL BE HELD FOR 5 DAYS BEFORE ISSUING A FINAL NEGATIVE REPORT     Performed at [facility]   Report Status PENDING   Incomplete     Labs: Basic Metabolic Panel:  Recent Labs Lab 07/22/13 1245 07/22/13 1700 07/23/13 0530 07/26/13 0610  NA 138  --  140 143  K 4.1  --  3.7 4.3  CL 100  --  103 105  CO2 28  --  27 29  GLUCOSE 106*  --  74 79  BUN 17  --  19 29*  CREATININE 1.52* 1.44* 1.61* 2.10*  CALCIUM 9.5  --  8.8 8.9   Liver Function Tests:  Recent Labs Lab 07/23/13 0530  AST 18  ALT 13  ALKPHOS 87  BILITOT 0.5  PROT 6.0  ALBUMIN 2.6*  CBC:  Recent Labs Lab 07/22/13 1245 07/22/13 1700 07/23/13 0530  WBC 7.7 8.6 8.1  NEUTROABS 4.8  --   --   HGB 12.7* 12.5* 11.9*  HCT 38.6* 38.0* 36.5*  MCV 95.5 95.0 94.6  PLT 199 196 194   Cardiac Enzymes:  Recent Labs Lab 07/22/13 1245  TROPONINI <0.30   BNP: BNP (last 3 results)  Recent Labs  12/06/12 1051 12/08/12 0653 02/06/13 1900  PROBNP 5232.0* 8353.0* 2842.0*   CBG:  Recent Labs Lab 07/23/13 1626 07/23/13 2039  07/24/13 2122 07/25/13 2240 07/26/13 0618  GLUCAP 188* 124* 172* 138* 78    Signed:  Emelio Schneller  [facility] 07/26/2013, 1:57 PM

## 2013-07-26 NOTE — Plan of Care (Signed)
Problem: Phase I Progression Outcomes Goal: Other Phase I Outcomes/Goals Outcome: Progressing Patient continues on PO Doxycycline for treatment of cellulitis in the RLE.  Private sitter for baseline confusion.  Patient is pleasantly disoriented to time and place.  No acute events overnight.  Will continue to monitor.

## 2013-07-29 LAB — CULTURE, BLOOD (ROUTINE X 2): Culture: NO GROWTH

## 2013-07-31 ENCOUNTER — Other Ambulatory Visit: Payer: Self-pay | Admitting: Internal Medicine

## 2013-08-07 ENCOUNTER — Ambulatory Visit: Payer: Self-pay | Admitting: Internal Medicine

## 2013-08-07 ENCOUNTER — Encounter: Payer: Self-pay | Admitting: Internal Medicine

## 2013-08-11 ENCOUNTER — Other Ambulatory Visit: Payer: Self-pay | Admitting: Internal Medicine

## 2013-08-22 ENCOUNTER — Ambulatory Visit: Payer: Medicare Other | Admitting: Internal Medicine

## 2013-08-26 ENCOUNTER — Other Ambulatory Visit: Payer: Self-pay | Admitting: Internal Medicine

## 2013-08-27 ENCOUNTER — Telehealth: Payer: Self-pay | Admitting: *Deleted

## 2013-08-27 NOTE — Telephone Encounter (Signed)
Patient caregiver called and stated that patient has a left leg wound and needs an antibiotic called in. Called and told her that we can't just call in an antibiotic without seeing patient first. Suggested her taking him to the Urgent care center since we have no available appointments. She agreed.

## 2013-08-29 ENCOUNTER — Encounter: Payer: Self-pay | Admitting: Internal Medicine

## 2013-09-12 ENCOUNTER — Emergency Department (HOSPITAL_COMMUNITY): Payer: Medicare Other

## 2013-09-12 ENCOUNTER — Emergency Department (HOSPITAL_COMMUNITY)
Admission: EM | Admit: 2013-09-12 | Discharge: 2013-09-12 | Disposition: A | Discharge status: Home / Self Care | Payer: Medicare Other | Attending: Emergency Medicine | Admitting: Emergency Medicine

## 2013-09-12 ENCOUNTER — Encounter (HOSPITAL_COMMUNITY): Payer: Self-pay | Admitting: Emergency Medicine

## 2013-09-12 DIAGNOSIS — Z7982 Long term (current) use of aspirin: Secondary | ICD-10-CM | POA: Insufficient documentation

## 2013-09-12 DIAGNOSIS — L02619 Cutaneous abscess of unspecified foot: Secondary | ICD-10-CM | POA: Insufficient documentation

## 2013-09-12 DIAGNOSIS — I5021 Acute systolic (congestive) heart failure: Secondary | ICD-10-CM | POA: Insufficient documentation

## 2013-09-12 DIAGNOSIS — Z792 Long term (current) use of antibiotics: Secondary | ICD-10-CM | POA: Insufficient documentation

## 2013-09-12 DIAGNOSIS — L03119 Cellulitis of unspecified part of limb: Principal | ICD-10-CM

## 2013-09-12 DIAGNOSIS — K219 Gastro-esophageal reflux disease without esophagitis: Secondary | ICD-10-CM | POA: Insufficient documentation

## 2013-09-12 DIAGNOSIS — Z9861 Coronary angioplasty status: Secondary | ICD-10-CM | POA: Insufficient documentation

## 2013-09-12 DIAGNOSIS — L03115 Cellulitis of right lower limb: Secondary | ICD-10-CM

## 2013-09-12 DIAGNOSIS — Z862 Personal history of diseases of the blood and blood-forming organs and certain disorders involving the immune mechanism: Secondary | ICD-10-CM | POA: Insufficient documentation

## 2013-09-12 DIAGNOSIS — Z8669 Personal history of other diseases of the nervous system and sense organs: Secondary | ICD-10-CM | POA: Insufficient documentation

## 2013-09-12 DIAGNOSIS — Z86718 Personal history of other venous thrombosis and embolism: Secondary | ICD-10-CM | POA: Insufficient documentation

## 2013-09-12 DIAGNOSIS — N183 Chronic kidney disease, stage 3 unspecified: Secondary | ICD-10-CM | POA: Insufficient documentation

## 2013-09-12 DIAGNOSIS — Z95818 Presence of other cardiac implants and grafts: Secondary | ICD-10-CM | POA: Insufficient documentation

## 2013-09-12 DIAGNOSIS — E119 Type 2 diabetes mellitus without complications: Secondary | ICD-10-CM | POA: Insufficient documentation

## 2013-09-12 DIAGNOSIS — I209 Angina pectoris, unspecified: Secondary | ICD-10-CM | POA: Insufficient documentation

## 2013-09-12 DIAGNOSIS — I2510 Atherosclerotic heart disease of native coronary artery without angina pectoris: Secondary | ICD-10-CM | POA: Insufficient documentation

## 2013-09-12 DIAGNOSIS — I129 Hypertensive chronic kidney disease with stage 1 through stage 4 chronic kidney disease, or unspecified chronic kidney disease: Secondary | ICD-10-CM | POA: Insufficient documentation

## 2013-09-12 DIAGNOSIS — Z79899 Other long term (current) drug therapy: Secondary | ICD-10-CM | POA: Insufficient documentation

## 2013-09-12 DIAGNOSIS — Z87891 Personal history of nicotine dependence: Secondary | ICD-10-CM | POA: Insufficient documentation

## 2013-09-12 DIAGNOSIS — L02419 Cutaneous abscess of limb, unspecified: Secondary | ICD-10-CM | POA: Insufficient documentation

## 2013-09-12 DIAGNOSIS — Z872 Personal history of diseases of the skin and subcutaneous tissue: Secondary | ICD-10-CM | POA: Insufficient documentation

## 2013-09-12 DIAGNOSIS — I4891 Unspecified atrial fibrillation: Secondary | ICD-10-CM | POA: Insufficient documentation

## 2013-09-12 DIAGNOSIS — E039 Hypothyroidism, unspecified: Secondary | ICD-10-CM | POA: Insufficient documentation

## 2013-09-12 DIAGNOSIS — Z794 Long term (current) use of insulin: Secondary | ICD-10-CM | POA: Insufficient documentation

## 2013-09-12 LAB — CBC WITH DIFFERENTIAL/PLATELET
BASOS PCT: 1 % (ref 0–1)
Basophils Absolute: 0 10*3/uL (ref 0.0–0.1)
Eosinophils Absolute: 0.3 10*3/uL (ref 0.0–0.7)
Eosinophils Relative: 3 % (ref 0–5)
HEMATOCRIT: 37.2 % — AB (ref 39.0–52.0)
Hemoglobin: 11.9 g/dL — ABNORMAL LOW (ref 13.0–17.0)
LYMPHS PCT: 22 % (ref 12–46)
Lymphs Abs: 1.9 10*3/uL (ref 0.7–4.0)
MCH: 31.1 pg (ref 26.0–34.0)
MCHC: 32 g/dL (ref 30.0–36.0)
MCV: 97.1 fL (ref 78.0–100.0)
Monocytes Absolute: 1.1 10*3/uL — ABNORMAL HIGH (ref 0.1–1.0)
Monocytes Relative: 12 % (ref 3–12)
NEUTROS ABS: 5.3 10*3/uL (ref 1.7–7.7)
Neutrophils Relative %: 62 % (ref 43–77)
Platelets: 180 10*3/uL (ref 150–400)
RBC: 3.83 MIL/uL — ABNORMAL LOW (ref 4.22–5.81)
RDW: 14.6 % (ref 11.5–15.5)
WBC: 8.6 10*3/uL (ref 4.0–10.5)

## 2013-09-12 LAB — BASIC METABOLIC PANEL
BUN: 30 mg/dL — ABNORMAL HIGH (ref 6–23)
CHLORIDE: 100 meq/L (ref 96–112)
CO2: 27 meq/L (ref 19–32)
CREATININE: 2.09 mg/dL — AB (ref 0.50–1.35)
Calcium: 9.1 mg/dL (ref 8.4–10.5)
GFR calc Af Amer: 33 mL/min — ABNORMAL LOW (ref >90)
GFR calc non Af Amer: 28 mL/min — ABNORMAL LOW (ref >90)
Glucose, Bld: 146 mg/dL — ABNORMAL HIGH (ref 70–99)
POTASSIUM: 4.7 meq/L (ref 3.7–5.3)
SODIUM: 141 meq/L (ref 137–147)

## 2013-09-12 MED ORDER — DOXYCYCLINE HYCLATE 100 MG PO CAPS: 100.0000 mg | ORAL_CAPSULE | Freq: Two times a day (BID) | ORAL | Status: DC

## 2013-09-12 MED ORDER — SODIUM CHLORIDE 0.9 % IV SOLN
INTRAVENOUS | Status: DC
  Administered 2013-09-12: 18:00:00 via INTRAVENOUS

## 2013-09-12 MED ORDER — VANCOMYCIN HCL IN DEXTROSE 1-5 GM/200ML-% IV SOLN
1000.0000 mg | Freq: Once | INTRAVENOUS | Status: AC
  Administered 2013-09-12: 1000 mg via INTRAVENOUS
  Filled 2013-09-12: qty 200

## 2013-09-12 NOTE — ED Provider Notes (Signed)
CSN: [id number]     Arrival date & time 09/12/13  1235 History   First MD Initiated Contact with Patient 09/12/13 1628     Chief Complaint  Patient presents with  . Leg Swelling   (Consider location/radiation/quality/duration/timing/severity/associated sxs/prior Treatment) The history is provided by the patient.   80 year old male followed by [facility]. Brought in by family for 6 redness and an open wound to his right leg. Patient has a long history of cellulitis in that area last time he was hospitalized was November 16 the treatment then is been off antibiotics since that time. 2 cm area opened up and is losing it did bleed well yesterday. Family is concerned about worsening infection. Patient denies any fever or any symptoms. He does have home health care nurse.  Past Medical History  Diagnosis Date  . SYSTOLIC HEART FAILURE, ACUTE   . PULMONARY HYPERTENSION   . PAROXYSMAL ATRIAL FIBRILLATION   . HYPERTENSION   . DVT   . CARDIOMYOPATHY   . CAD, NATIVE VESSEL   . TOBACCO USE, QUIT   . RENAL DISEASE, CHRONIC, STAGE III   . MURAL THROMBUS, APEX OF HEART   . Long term current use of anticoagulant   . HYPOKALEMIA   . DM   . ANEMIA, IRON DEFICIENCY   . CHF (congestive heart failure)   . Senile cataract, unspecified   . Other specified disease of nail   . Unspecified hearing loss   . Chronic kidney disease, stage II (mild)   . Memory loss   . Unspecified urinary incontinence   . Abnormality of gait   . Unspecified hypothyroidism   . Unspecified vitamin D deficiency   . Anemia, unspecified   . Coronary atherosclerosis of native coronary artery   . Acute systolic heart failure   . Primary pulmonary hypertension   . Cardiomyopathy in other diseases classified elsewhere   . Other seborrheic dermatitis   . Other and unspecified angina pectoris   . Other and unspecified hyperlipidemia   . Slowing of urinary stream   . Type II or unspecified type diabetes mellitus without mention of  complication, not stated as uncontrolled   . Unspecified essential hypertension   . Esophageal reflux   . Edema   . Shortness of breath   . Hydrocele, unspecified   . Impotence of organic origin   . Edema    Past Surgical History  Procedure Laterality Date  . Cholecystectomy  1985  . Placement of a veriflex bare-metal sten    . Coronary angioplasty with stent placement      VeriFLEX bare-metal stent  . Right heart cardiac catherterization    . Coronary anteriogram  05/2009  . Doppler echocardiography  05/2009   Family History  Problem Relation Age of Onset  . Diabetes Mother   . Stroke Father   . Cancer Sister    History  Substance Use Topics  . Smoking status: Former Smoker -- 1.00 packs/day for 50 years    Types: Cigarettes    Quit date: 09/06/1998  . Smokeless tobacco: Never Used     Comment: 12/06/2012 "smoked from 1950 to 2000"  . Alcohol Use: 8.4 oz/week    14 Cans of beer per week     Comment: 12/06/2012 "2-3 beers/day; have whiskey q now and then"    Review of Systems  Constitutional: Negative for fever.  HENT: Negative for congestion.   Respiratory: Negative for shortness of breath.   Cardiovascular: Negative for chest pain.  Gastrointestinal: Negative for abdominal pain.  Genitourinary: Negative for dysuria.  Musculoskeletal: Negative for back pain.  Skin: Positive for wound.  Neurological: Negative for headaches.  Hematological: Does not bruise/bleed easily.  Psychiatric/Behavioral: Negative for confusion.    Allergies  Review of patient's allergies indicates no active allergies.  Home Medications   Current Outpatient Rx  Name  Route  Sig  Dispense  Refill  . acetaminophen (TYLENOL) 500 MG tablet   Oral   Take 500 mg by mouth every 6 (six) hours as needed for moderate pain.         Marland Kitchen amiodarone (PACERONE) 200 MG tablet   Oral   Take 0.5 tablets (100 mg total) by mouth daily.         Marland Kitchen aspirin EC 81 MG tablet   Oral   Take 81 mg by mouth  daily.         Marland Kitchen donepezil (ARICEPT) 5 MG tablet   Oral   Take 5 mg by mouth at bedtime.         . furosemide (LASIX) 40 MG tablet   Oral   Take 40 mg by mouth 2 (two) times daily.          Marland Kitchen HYDROcodone-acetaminophen (NORCO/VICODIN) 5-325 MG per tablet   Oral   Take 1 tablet by mouth every 6 (six) hours as needed for moderate pain.         . Insulin Glargine (LANTUS SOLOSTAR) 100 UNIT/ML Solostar Pen   Subcutaneous   Inject 5 Units into the skin daily at 10 pm.         . isosorbide mononitrate (IMDUR) 30 MG 24 hr tablet   Oral   Take 30 mg by mouth daily.         Marland Kitchen levothyroxine (SYNTHROID, LEVOTHROID) 50 MCG tablet   Oral   Take 1 tablet (50 mcg total) by mouth daily.   90 tablet   3   . losartan (COZAAR) 25 MG tablet   Oral   Take 0.5 tablets (12.5 mg total) by mouth daily.   30 tablet   1   . mirtazapine (REMERON) 15 MG tablet   Oral   Take 15 mg by mouth at bedtime. For appetite stimulation         . nebivolol (BYSTOLIC) 2.5 MG tablet   Oral   Take 1 tablet (2.5 mg total) by mouth daily.   30 tablet   1   . omeprazole (PRILOSEC) 40 MG capsule   Oral   Take 40 mg by mouth daily.         . risperiDONE (RISPERDAL) 0.5 MG tablet   Oral   Take 0.5 mg by mouth at bedtime.         . traMADol (ULTRAM) 50 MG tablet   Oral   Take 50 mg by mouth 2 (two) times daily.         Marland Kitchen doxycycline (VIBRAMYCIN) 100 MG capsule   Oral   Take 1 capsule (100 mg total) by mouth 2 (two) times daily.   14 capsule   1   . nitroGLYCERIN (NITROSTAT) 0.4 MG SL tablet   Sublingual   Place 1 tablet (0.4 mg total) under the tongue every 5 (five) minutes as needed for chest pain.   100 tablet   5    BP 131/53  Pulse 58  Temp(Src) 97.9 F (36.6 C) (Oral)  Resp 13  SpO2 100% Physical Exam  Nursing note and vitals reviewed. Constitutional:  He is oriented to person, place, and time. He appears well-developed and well-nourished. No distress.  HENT:  Head:  Normocephalic and atraumatic.  Mouth/Throat: Oropharynx is clear and moist.  Cardiovascular: Normal rate, regular rhythm and normal heart sounds.   No murmur heard. Pulmonary/Chest: Effort normal. No respiratory distress.  Abdominal: Soft. Bowel sounds are normal. There is no tenderness.  Neurological: He is alert and oriented to person, place, and time. No cranial nerve deficit. He exhibits normal muscle tone. Coordination normal.  Skin: Skin is warm. There is erythema.  Redness and swelling to the right lower leg ankle and foot. Erythema upper part of the ankle to distal one third of shin.   Patient with a thin open skin wound to the dermis with some mild slight bleeding. This measures about 2 x 2 cm in size. Surrounding erythema is about 10 cm. Also surrounding edema is greater than that. Cap refill is 2 seconds to the toe. Dorsalis pedis pulses not palpable.    ED Course  Procedures (including critical care time) Labs Review Labs Reviewed  CBC WITH DIFFERENTIAL - Abnormal; Notable for the following:    RBC 3.83 (*)    Hemoglobin 11.9 (*)    HCT 37.2 (*)    Monocytes Absolute 1.1 (*)    All other components within normal limits  BASIC METABOLIC PANEL - Abnormal; Notable for the following:    Glucose, Bld 146 (*)    BUN 30 (*)    Creatinine, Ser 2.09 (*)    GFR calc non Af Amer 28 (*)    GFR calc Af Amer 33 (*)    All other components within normal limits   Imaging Review Dg Tibia/fibula Right  09/12/2013   CLINICAL DATA:  Right leg swelling  EXAM: RIGHT TIBIA AND FIBULA - 2 VIEW  COMPARISON:  None.  FINDINGS: There is a tiny bony density just lateral to the proximal tibia metaphysis. This likely represents ossification in the interosseous ligament. Otherwise, no evidence of fracture or dislocation. Unremarkable soft tissues. Small dense object in the subcutaneous fat medial to the tibial plateau.  IMPRESSION: No acute bony pathology. Metallic foreign body versus highly calcified  density in the soft tissues.   Electronically Signed   By: Maryclare Bean M.D.   On: 09/12/2013 17:33    EKG Interpretation   None       MDM   1. Cellulitis of right foot   2. Cellulitis of right leg     Patient with history of trouble with cellulitis to the right leg. Patient has not been on antibiotics for several months. Patient had an open wound looks more like a abrasion through to the dermis on the right anterior part of the shin measuring about 2 x 2 centimeters. Erythema surrounding according to family maybe about where it's been. Patient's Refill is 2 seconds. X-rays are negative for any bony abnormalities. Will treat as a cellulitis. Patient given 1 g of vancomycin he'll be continued on doxycycline at home. He has followup with Dr. Chilton Si also has wound care people coming in on a regular basis to take care of his leg. Patient will return with family for any new or worse symptoms.  Patient's labs here no significant leukocytosis no fever renal function next renal insufficiency as per family that base line.    Shelda Jakes, MD 09/12/13 [phone]

## 2013-09-12 NOTE — Discharge Instructions (Signed)
Take antibiotic as directed. Continue wound care with knee Neosporin ointment cleaning with soap and water in between. Follow up with your regular Dr. in next few days. If the cellulitis gets any worse have him come back for recheck. Would expect the antibiotics given under control.

## 2013-09-12 NOTE — ED Notes (Signed)
Per pt and family, pt here recently for right leg infection and now he has oozing sores on leg.

## 2013-09-19 ENCOUNTER — Ambulatory Visit: Payer: Self-pay | Admitting: Nurse Practitioner

## 2013-09-20 ENCOUNTER — Other Ambulatory Visit: Payer: Self-pay | Admitting: Internal Medicine

## 2013-09-25 ENCOUNTER — Other Ambulatory Visit: Payer: Self-pay | Admitting: Internal Medicine

## 2013-09-26 ENCOUNTER — Other Ambulatory Visit: Payer: Self-pay | Admitting: Internal Medicine

## 2013-09-28 ENCOUNTER — Telehealth: Payer: Self-pay | Admitting: *Deleted

## 2013-09-28 NOTE — Telephone Encounter (Signed)
Patient no showed to appointment because sister could not get him here. Joel Walker has been going to the house. They want him to do a U/A because patient is being combative and urinating quite a bit. She looked over the list of medication that he was taking and she wants him to do a urinalysis. The sister wants to do the OTC kit done at [facility]. They stated that the test showed no problem with his urine, but this morning she got another call stating that patient is still being combative. Told her that she needed to take patient to [facility] to be evaluated.

## 2013-10-03 ENCOUNTER — Other Ambulatory Visit: Payer: Self-pay | Admitting: Nurse Practitioner

## 2013-10-04 ENCOUNTER — Ambulatory Visit (INDEPENDENT_AMBULATORY_CARE_PROVIDER_SITE_OTHER): Payer: Medicare Other | Admitting: Nurse Practitioner

## 2013-10-04 ENCOUNTER — Encounter: Payer: Self-pay | Admitting: Nurse Practitioner

## 2013-10-04 VITALS — BP 128/66 | HR 55 | Temp 99.2°F | Resp 10 | Ht 70.0 in | Wt 194.0 lb

## 2013-10-04 DIAGNOSIS — L03115 Cellulitis of right lower limb: Secondary | ICD-10-CM

## 2013-10-04 DIAGNOSIS — F05 Delirium due to known physiological condition: Secondary | ICD-10-CM

## 2013-10-04 DIAGNOSIS — R634 Abnormal weight loss: Secondary | ICD-10-CM

## 2013-10-04 DIAGNOSIS — L03119 Cellulitis of unspecified part of limb: Secondary | ICD-10-CM

## 2013-10-04 DIAGNOSIS — I5022 Chronic systolic (congestive) heart failure: Secondary | ICD-10-CM

## 2013-10-04 DIAGNOSIS — E039 Hypothyroidism, unspecified: Secondary | ICD-10-CM

## 2013-10-04 DIAGNOSIS — L02619 Cutaneous abscess of unspecified foot: Secondary | ICD-10-CM

## 2013-10-04 DIAGNOSIS — F0391 Unspecified dementia with behavioral disturbance: Secondary | ICD-10-CM | POA: Insufficient documentation

## 2013-10-04 DIAGNOSIS — F03918 Unspecified dementia, unspecified severity, with other behavioral disturbance: Secondary | ICD-10-CM

## 2013-10-04 LAB — POCT URINALYSIS DIPSTICK
Bilirubin, UA: NEGATIVE
GLUCOSE UA: NEGATIVE
Ketones, UA: NEGATIVE
Nitrite, UA: NEGATIVE
Protein, UA: NEGATIVE
RBC UA: NEGATIVE
Urobilinogen, UA: 0.2
pH, UA: 6.5

## 2013-10-04 MED ORDER — DONEPEZIL HCL 10 MG PO TABS: 10.0000 mg | ORAL_TABLET | Freq: Every day | ORAL | Status: DC

## 2013-10-04 MED ORDER — MIRTAZAPINE 30 MG PO TABS: 30.0000 mg | ORAL_TABLET | Freq: Every day | ORAL | Status: DC

## 2013-10-04 NOTE — Progress Notes (Signed)
Patient ID: Joel Walker, male   DOB: 1930/06/17, 80 y.o.   MRN: [id number]    No Known Allergies  Chief Complaint  Patient presents with  . Medical Managment of Chronic Issues    Combative   . Urinary Tract Infection    Confusion, denies pain, discomfort or fever   . Weight Gain    Patient with weight gain x 1-2 months-? related to medication     HPI: Patient is a 80 y.o. male seen in the office today for acute confusion -comfort keepers reported to family pt with increase urinary frequency and confusion -wanted to have urine check; staying awake all night -- more confused at night -increase in confusion comes and goes   -went to the hospital at the beginning of January due to cellulitis; finished antibiotics and this has improved -very good appetite; eats all the time Review of Systems:  Review of Systems  Constitutional: Negative for fever, chills and malaise/fatigue.  Respiratory: Negative for shortness of breath.   Cardiovascular: Negative for chest pain.  Gastrointestinal: Negative for abdominal pain, diarrhea and constipation.  Genitourinary: Negative for dysuria.  Skin: Negative.   Neurological: Negative for tingling, weakness and headaches.  Psychiatric/Behavioral: Positive for memory loss.     Past Medical History  Diagnosis Date  . SYSTOLIC HEART FAILURE, ACUTE   . PULMONARY HYPERTENSION   . PAROXYSMAL ATRIAL FIBRILLATION   . HYPERTENSION   . DVT   . CARDIOMYOPATHY   . CAD, NATIVE VESSEL   . TOBACCO USE, QUIT   . RENAL DISEASE, CHRONIC, STAGE III   . MURAL THROMBUS, APEX OF HEART   . Long term current use of anticoagulant   . HYPOKALEMIA   . DM   . ANEMIA, IRON DEFICIENCY   . CHF (congestive heart failure)   . Senile cataract, unspecified   . Other specified disease of nail   . Unspecified hearing loss   . Chronic kidney disease, stage II (mild)   . Memory loss   . Unspecified urinary incontinence   . Abnormality of gait   . Unspecified  hypothyroidism   . Unspecified vitamin D deficiency   . Anemia, unspecified   . Coronary atherosclerosis of native coronary artery   . Acute systolic heart failure   . Primary pulmonary hypertension   . Cardiomyopathy in other diseases classified elsewhere   . Other seborrheic dermatitis   . Other and unspecified angina pectoris   . Other and unspecified hyperlipidemia   . Slowing of urinary stream   . Type II or unspecified type diabetes mellitus without mention of complication, not stated as uncontrolled   . Unspecified essential hypertension   . Esophageal reflux   . Edema   . Shortness of breath   . Hydrocele, unspecified   . Impotence of organic origin   . Edema    Past Surgical History  Procedure Laterality Date  . Cholecystectomy  1985  . Placement of a veriflex bare-metal sten    . Coronary angioplasty with stent placement      VeriFLEX bare-metal stent  . Right heart cardiac catherterization    . Coronary anteriogram  05/2009  . Doppler echocardiography  05/2009   Social History:   reports that he quit smoking about 15 years ago. His smoking use included Cigarettes. He has a 50 pack-year smoking history. He has never used smokeless tobacco. He reports that he drinks about 8.4 ounces of alcohol per week. He reports that he does not use illicit  drugs.  Family History  Problem Relation Age of Onset  . Diabetes Mother   . Stroke Father   . Cancer Sister     Medications: Patient's Medications  New Prescriptions   No medications on file  Previous Medications   ACETAMINOPHEN (TYLENOL) 500 MG TABLET    Take 500 mg by mouth every 6 (six) hours as needed for moderate pain.   AMIODARONE (PACERONE) 200 MG TABLET    Take 0.5 tablets (100 mg total) by mouth daily.   ASPIRIN EC 81 MG TABLET    Take 81 mg by mouth daily.   B-D ULTRAFINE III SHORT PEN 31G X 8 MM MISC       BYSTOLIC 2.5 MG TABLET    TAKE 1 TABLET BY MOUTH EVERY DAY   DONEPEZIL (ARICEPT) 5 MG TABLET    Take 5 mg  by mouth at bedtime.   HYDROCODONE-ACETAMINOPHEN (NORCO/VICODIN) 5-325 MG PER TABLET    Take 1 tablet by mouth every 6 (six) hours as needed for moderate pain.   ISOSORBIDE MONONITRATE (IMDUR) 30 MG 24 HR TABLET    Take 30 mg by mouth daily.   LANTUS SOLOSTAR 100 UNIT/ML SOLOSTAR PEN    INJECT 5 UNITS SUBCITANEOUSLY AT BEDTIME   LEVOTHYROXINE (SYNTHROID, LEVOTHROID) 50 MCG TABLET    Take 1 tablet (50 mcg total) by mouth daily.   LOSARTAN (COZAAR) 25 MG TABLET    Take 0.5 tablets (12.5 mg total) by mouth daily.   MIRTAZAPINE (REMERON) 15 MG TABLET    Take 15 mg by mouth at bedtime. For appetite stimulation   NITROGLYCERIN (NITROSTAT) 0.4 MG SL TABLET    Place 1 tablet (0.4 mg total) under the tongue every 5 (five) minutes as needed for chest pain.   OMEPRAZOLE (PRILOSEC) 40 MG CAPSULE    Take 40 mg by mouth daily.   RISPERIDONE (RISPERDAL) 0.5 MG TABLET    Take 0.5 mg by mouth at bedtime.   TRAMADOL (ULTRAM) 50 MG TABLET    TAKE 1 TABLET BY MOUTH EVERY 6 HOURS AS NEEDED FOR PAIN ONLY  Modified Medications   Modified Medication Previous Medication   FUROSEMIDE (LASIX) 20 MG TABLET furosemide (LASIX) 20 MG tablet      2 by mouth daily    TAKE 1 TABLET BY MOUTH 3 TIMES A DAY  Discontinued Medications   DOXYCYCLINE (VIBRAMYCIN) 100 MG CAPSULE    Take 1 capsule (100 mg total) by mouth 2 (two) times daily.   FUROSEMIDE (LASIX) 40 MG TABLET    Take 40 mg by mouth 2 (two) times daily.      Physical Exam:  Filed Vitals:   10/04/13 1309  BP: 128/66  Pulse: 55  Temp: 99.2 F (37.3 C)  TempSrc: Oral  Resp: 10  Height: 5\' 10"  (1.778 m)  Weight: 194 lb (87.998 kg)  SpO2: 95%    Physical Exam  Constitutional: He is oriented to person, place, and time and well-developed, well-nourished, and in no distress. No distress.  HENT:  Head: Normocephalic and atraumatic.  Mouth/Throat: Oropharynx is clear and moist.  Eyes: Conjunctivae and EOM are normal. Pupils are equal, round, and reactive to  light.  Neck: Normal range of motion. Neck supple.  Cardiovascular: Normal rate, regular rhythm and normal heart sounds.   Lower extremity edema; pink due to venous congestion; no heat or drainage    Pulmonary/Chest: Effort normal and breath sounds normal.  Abdominal: Soft. Bowel sounds are normal.  Musculoskeletal: Normal range of motion. He  exhibits edema (2+ to lower extermities; this is chronic-- no worsening of edema).  Neurological: He is alert and oriented to person, place, and time.  Skin: Skin is warm and dry. He is not diaphoretic.  Psychiatric: Affect normal.     Labs reviewed: Basic Metabolic Panel:  Recent Labs  40/98/1102/10/20 1655  04/10/13 1617  07/23/13 0530 07/26/13 0610 09/12/13 1706  NA  --   < >  --   < > 140 143 141  K  --   < >  --   < > 3.7 4.3 4.7  CL  --   < >  --   < > 103 105 100  CO2  --   < >  --   < > 27 29 27   GLUCOSE  --   < >  --   < > 74 79 146*  BUN  --   < >  --   < > 19 29* 30*  CREATININE 1.89*  < >  --   < > 1.61* 2.10* 2.09*  CALCIUM  --   < >  --   < > 8.8 8.9 9.1  TSH 4.213  --  2.470  --   --   --   --   < > = values in this interval not displayed. Liver Function Tests:  Recent Labs  12/06/12 1655 12/07/12 0625 07/23/13 0530  AST 33 29 18  ALT 21 20 13   ALKPHOS 72 67 87  BILITOT 1.6* 1.5* 0.5  PROT 6.0 5.6* 6.0  ALBUMIN 2.9* 2.7* 2.6*   No results found for this basename: LIPASE, AMYLASE,  in the last 8760 hours  Recent Labs  12/06/12 1701  AMMONIA 20   CBC:  Recent Labs  04/25/13 1306 07/22/13 1245 07/22/13 1700 07/23/13 0530 09/12/13 1706  WBC 9.5 7.7 8.6 8.1 8.6  NEUTROABS 6.3 4.8  --   --  5.3  HGB 13.8 12.7* 12.5* 11.9* 11.9*  HCT 41.3 38.6* 38.0* 36.5* 37.2*  MCV 95 95.5 95.0 94.6 97.1  PLT 227 199 196 194 180   Lipid Panel: No results found for this basename: CHOL, HDL, LDLCALC, TRIG, CHOLHDL, LDLDIRECT,  in the last 8760 hours TSH:  Recent Labs  12/06/12 1655 04/10/13 1617  TSH 4.213 2.470    A1C: No components found with this basename: A1C,    Assessment/Plan 1. Acute confusional state -worse at night but worried about UTI -will send urine off for C&S - POCT urinalysis dipstick - Culture, Urine - Basic metabolic panel - CBC With differential/Platelet  2. Chronic systolic heart failure -remains stable  3. Loss of weight -pt with significant weight gain-- in oct 14 was 163 now 195 -eating good; no increase in swelling -will increase mirtazapine (REMERON) 30 MG tablet; Take 1 tablet (30 mg total) by mouth at bedtime. For appetite stimulation  Dispense: 90 tablet; Refill: 3-- hopefully this will still stimulate appetite because pt has very poor appetite prior to medication but with somewhat less effectiveness since he has had such significant gain   4. Cellulitis of right foot -resolved  5. Hypothyroid - on synthroid 50 mcg - TSH  6. Dementia with behavioral disturbance -conts with overall slow decline of memory  -has aids come out to assist with bathing and dressing -conts Risperdal -- worse at night - donepezil (ARICEPT) 10 MG tablet; Take 1 tablet (10 mg total) by mouth at bedtime.  Dispense: 30 tablet; Refill: 3

## 2013-10-04 NOTE — Patient Instructions (Signed)
-  increase remeron to 30 mg daily  -increase aricept to 10 mg daily  Will send urine off for culture

## 2013-10-05 LAB — CBC WITH DIFFERENTIAL
BASOS: 1 %
Basophils Absolute: 0 10*3/uL (ref 0.0–0.2)
Eos: 3 %
Eosinophils Absolute: 0.3 10*3/uL (ref 0.0–0.4)
HCT: 33.9 % — ABNORMAL LOW (ref 37.5–51.0)
HEMOGLOBIN: 10.9 g/dL — AB (ref 12.6–17.7)
Immature Grans (Abs): 0 10*3/uL (ref 0.0–0.1)
Immature Granulocytes: 0 %
LYMPHS ABS: 1.8 10*3/uL (ref 0.7–3.1)
Lymphs: 22 %
MCH: 30.6 pg (ref 26.6–33.0)
MCHC: 32.2 g/dL (ref 31.5–35.7)
MCV: 95 fL (ref 79–97)
MONOCYTES: 11 %
Monocytes Absolute: 0.9 10*3/uL (ref 0.1–0.9)
NEUTROS ABS: 5.2 10*3/uL (ref 1.4–7.0)
Neutrophils Relative %: 63 %
Platelets: 194 10*3/uL (ref 150–379)
RBC: 3.56 x10E6/uL — ABNORMAL LOW (ref 4.14–5.80)
RDW: 14.5 % (ref 12.3–15.4)
WBC: 8.2 10*3/uL (ref 3.4–10.8)

## 2013-10-05 LAB — URINE CULTURE: ORGANISM ID, BACTERIA: NO GROWTH

## 2013-10-05 LAB — BASIC METABOLIC PANEL
BUN/Creatinine Ratio: 11 (ref 10–22)
BUN: 25 mg/dL (ref 8–27)
CALCIUM: 8.4 mg/dL — AB (ref 8.6–10.2)
CO2: 23 mmol/L (ref 18–29)
CREATININE: 2.24 mg/dL — AB (ref 0.76–1.27)
Chloride: 101 mmol/L (ref 97–108)
GFR calc non Af Amer: 26 mL/min/{1.73_m2} — ABNORMAL LOW (ref >59)
GFR, EST AFRICAN AMERICAN: 30 mL/min/{1.73_m2} — AB (ref >59)
Glucose: 155 mg/dL — ABNORMAL HIGH (ref 65–99)
Potassium: 4.1 mmol/L (ref 3.5–5.2)
SODIUM: 143 mmol/L (ref 134–144)

## 2013-10-05 LAB — TSH: TSH: 1.9 u[IU]/mL (ref 0.450–4.500)

## 2013-10-24 ENCOUNTER — Telehealth: Payer: Self-pay | Admitting: *Deleted

## 2013-10-24 NOTE — Telephone Encounter (Signed)
Patient caregiver, Delorise RoyalsMargie Rust, Called and stated that patient was seen last week and medications was changed and now patient is walking with his head down and not telling anyone when he needs to go potty, states that he is not acting exactly right and just sits in his recliner. Could this be coming from the medication or something else? Please Advise.

## 2013-10-24 NOTE — Telephone Encounter (Signed)
May stop medication and see if this helps (give it a few days to get out of his system); otherwise needs to make appt

## 2013-10-24 NOTE — Telephone Encounter (Signed)
Margie Notified and will give it a week without the medication and will call us. Will get a appointment if symptoms worsen or doesn't get better.

## 2013-11-07 ENCOUNTER — Ambulatory Visit (INDEPENDENT_AMBULATORY_CARE_PROVIDER_SITE_OTHER): Payer: Medicare Other | Admitting: Internal Medicine

## 2013-11-07 ENCOUNTER — Encounter: Payer: Self-pay | Admitting: Internal Medicine

## 2013-11-07 VITALS — BP 110/68 | HR 56 | Temp 98.2°F | Resp 16 | Wt 192.0 lb

## 2013-11-07 DIAGNOSIS — F03918 Unspecified dementia, unspecified severity, with other behavioral disturbance: Secondary | ICD-10-CM

## 2013-11-07 DIAGNOSIS — E119 Type 2 diabetes mellitus without complications: Secondary | ICD-10-CM

## 2013-11-07 DIAGNOSIS — I5022 Chronic systolic (congestive) heart failure: Secondary | ICD-10-CM

## 2013-11-07 DIAGNOSIS — R5381 Other malaise: Secondary | ICD-10-CM

## 2013-11-07 DIAGNOSIS — I10 Essential (primary) hypertension: Secondary | ICD-10-CM

## 2013-11-07 DIAGNOSIS — Z23 Encounter for immunization: Secondary | ICD-10-CM

## 2013-11-07 DIAGNOSIS — E039 Hypothyroidism, unspecified: Secondary | ICD-10-CM

## 2013-11-07 DIAGNOSIS — D509 Iron deficiency anemia, unspecified: Secondary | ICD-10-CM

## 2013-11-07 DIAGNOSIS — M25559 Pain in unspecified hip: Secondary | ICD-10-CM

## 2013-11-07 DIAGNOSIS — N183 Chronic kidney disease, stage 3 unspecified: Secondary | ICD-10-CM

## 2013-11-07 DIAGNOSIS — F0391 Unspecified dementia with behavioral disturbance: Secondary | ICD-10-CM

## 2013-11-07 DIAGNOSIS — R413 Other amnesia: Secondary | ICD-10-CM

## 2013-11-07 MED ORDER — TETANUS-DIPHTH-ACELL PERTUSSIS 5-2.5-18.5 LF-MCG/0.5 IM SUSP: 0.5000 mL | Freq: Once | INTRAMUSCULAR | Status: DC

## 2013-11-07 NOTE — Progress Notes (Signed)
Patient ID: Joel Walker, male   DOB: July 08, 1930, 80 y.o.   MRN: [id number]    Location:    [facility]  Place of Service:  OFFICE   No Known Allergies  Chief Complaint  Patient presents with  . Medical Managment of Chronic Issues    2 month f/u & discuss labs (printed)   . other    feet still swelling, getting dizzy when standing, talking out of nature & works alot even though he isnt't, shuffling feet when walking     HPI:  Memory loss:progressing  Dementia with behavioral disturbance: : he is willful and hard to direct at times. Impulsive.  Chronic systolic heart failure: compensated  Debility: remains weak. Needs assistance to stand. Does not walk.  ANEMIA, IRON DEFICIENCY - needs follow up  RENAL DISEASE, CHRONIC, STAGE III -continue to follow  Diabetes mellitus -under control  Hypothyroid: controlled  HYPERTENSION: controlled  Hip pain: improved  Need for diphtheria-tetanus-pertussis (Tdap) vaccine, adult/adolescent - Plan: Tdap (BOOSTRIX) 5-2.5-18.5 LF-MCG/0.5 injection    Medications: Patient's Medications  New Prescriptions   No medications on file  Previous Medications   ACETAMINOPHEN (TYLENOL) 500 MG TABLET    Take 500 mg by mouth every 6 (six) hours as needed for moderate pain.   AMIODARONE (PACERONE) 200 MG TABLET    Take 0.5 tablets (100 mg total) by mouth daily.   ASPIRIN EC 81 MG TABLET    Take 81 mg by mouth daily.   B-D ULTRAFINE III SHORT PEN 31G X 8 MM MISC       BYSTOLIC 2.5 MG TABLET    TAKE 1 TABLET BY MOUTH EVERY DAY   DONEPEZIL (ARICEPT) 5 MG TABLET    Take by mouth. Take one tablet by mouth at bedtime for Alzheimer's Disease   FUROSEMIDE (LASIX) 20 MG TABLET    2 by mouth daily   ISOSORBIDE MONONITRATE (IMDUR) 30 MG 24 HR TABLET    Take 30 mg by mouth daily.   LANTUS SOLOSTAR 100 UNIT/ML SOLOSTAR PEN    INJECT 5 UNITS SUBCITANEOUSLY AT BEDTIME   LEVOTHYROXINE (SYNTHROID, LEVOTHROID) 50 MCG TABLET    Take 1 tablet (50 mcg total) by mouth  daily.   LOSARTAN (COZAAR) 25 MG TABLET    Take 0.5 tablets (12.5 mg total) by mouth daily.   MIRTAZAPINE (REMERON) 15 MG TABLET    Take 1 tablet by mouth at bedtime for appetite stimulation   NITROGLYCERIN (NITROSTAT) 0.4 MG SL TABLET    Place 1 tablet (0.4 mg total) under the tongue every 5 (five) minutes as needed for chest pain.   OMEPRAZOLE (PRILOSEC) 40 MG CAPSULE    Take 40 mg by mouth daily.   RISPERIDONE (RISPERDAL) 0.5 MG TABLET    Take 0.5 mg by mouth at bedtime.   TRAMADOL (ULTRAM) 50 MG TABLET    TAKE 1 TABLET BY MOUTH EVERY 6 HOURS AS NEEDED FOR PAIN ONLY  Modified Medications   Modified Medication Previous Medication   TDAP (BOOSTRIX) 5-2.5-18.5 LF-MCG/0.5 INJECTION Tdap (BOOSTRIX) 5-2.5-18.5 LF-MCG/0.5 injection      Inject 0.5 mLs into the muscle once.    Inject 0.5 mLs into the muscle once.  Discontinued Medications   DONEPEZIL (ARICEPT) 10 MG TABLET    Take 1 tablet (10 mg total) by mouth at bedtime.   MIRTAZAPINE (REMERON) 30 MG TABLET    Take 1 tablet (30 mg total) by mouth at bedtime. For appetite stimulation     Review of Systems  Constitutional:  Positive for appetite change and fatigue.  HENT: Positive for hearing loss.   Eyes: Negative.   Respiratory: Negative for cough, chest tightness and shortness of breath.   Cardiovascular: Positive for leg swelling. Negative for chest pain and palpitations.  Gastrointestinal: Negative.   Endocrine: Negative.   Genitourinary: Negative for dysuria, urgency, decreased urine volume and enuresis.  Musculoskeletal: Positive for arthralgias.  Skin:       Previously blistered areas on both buttocks near the intergluteal crease are healing.  Neurological: Positive for weakness. Negative for dizziness, tremors, seizures, syncope, speech difficulty and numbness.  Hematological: Negative.   Psychiatric/Behavioral: Positive for behavioral problems, confusion and agitation.       Impulsive. Independent nature, bt argumentative.     Filed Vitals:   11/07/13 1436  BP: 110/68  Pulse: 56  Temp: 98.2 F (36.8 C)  TempSrc: Oral  Resp: 16  Weight: 192 lb (87.091 kg)  SpO2: 99%   Physical Exam  Constitutional: No distress.  Lost weight.  Eyes:  Corrective lenses  Neck: Normal range of motion. Neck supple. No JVD present. No tracheal deviation present. No thyromegaly present.  Cardiovascular: Normal rate, regular rhythm, normal heart sounds and intact distal pulses.  Exam reveals no friction rub.   No murmur heard. Pulmonary/Chest: Effort normal and breath sounds normal. No respiratory distress. He has no wheezes. He has no rales. He exhibits no tenderness.  Abdominal: Soft. Bowel sounds are normal. He exhibits no distension and no mass. There is no tenderness.  Musculoskeletal: He exhibits edema. He exhibits no tenderness.  Large left olecranon bursal enlargement. Painless. No inflammation.  Lymphadenopathy:    He has no cervical adenopathy.  Neurological: He is alert. No cranial nerve deficit. Coordination abnormal.  Very unsteady when standing.  Skin: Skin is warm and dry. No rash noted. No erythema. No pallor.  Lipoma right lateral chest wall. Healing areas of the buttocks near the intergluteal crease. Previously blistered.  Psychiatric:  Argumentative. Impulsive.      Labs reviewed: Office Visit on 10/04/2013  Component Date Value Ref Range Status  . Color, UA 10/04/2013 Light Yellow   Final  . Clarity, UA 10/04/2013 Clear   Final  . Glucose, UA 10/04/2013 Neg   Final  . Bilirubin, UA 10/04/2013 Neg   Final  . Ketones, UA 10/04/2013 Neg   Final  . Spec Grav, UA 10/04/2013 <=1.005   Final  . Blood, UA 10/04/2013 Neg   Final  . pH, UA 10/04/2013 6.5   Final  . Protein, UA 10/04/2013 Neg   Final  . Urobilinogen, UA 10/04/2013 0.2   Final  . Nitrite, UA 10/04/2013 Neg   Final  . Leukocytes, UA 10/04/2013 Trace   Final  . Urine Culture, Routine 10/04/2013 Final report   Final  . Result 1  10/04/2013 No growth   Final  . Glucose 10/04/2013 155* 65 - 99 mg/dL Final  . BUN 10/04/2013 25  8 - 27 mg/dL Final  . Creatinine, Ser 10/04/2013 2.24* 0.76 - 1.27 mg/dL Final  . GFR calc non Af Amer 10/04/2013 26* >59 mL/min/1.73 Final  . GFR calc Af Amer 10/04/2013 30* >59 mL/min/1.73 Final  . BUN/Creatinine Ratio 10/04/2013 11  10 - 22 Final  . Sodium 10/04/2013 143  134 - 144 mmol/L Final  . Potassium 10/04/2013 4.1  3.5 - 5.2 mmol/L Final  . Chloride 10/04/2013 101  97 - 108 mmol/L Final  . CO2 10/04/2013 23  18 - 29 mmol/L Final  .  Calcium 10/04/2013 8.4* 8.6 - 10.2 mg/dL Final                 **Please note reference interval change**  . WBC 10/04/2013 8.2  3.4 - 10.8 x10E3/uL Final  . RBC 10/04/2013 3.56* 4.14 - 5.80 x10E6/uL Final  . Hemoglobin 10/04/2013 10.9* 12.6 - 17.7 g/dL Final  . HCT 10/04/2013 33.9* 37.5 - 51.0 % Final  . MCV 10/04/2013 95  79 - 97 fL Final  . MCH 10/04/2013 30.6  26.6 - 33.0 pg Final  . MCHC 10/04/2013 32.2  31.5 - 35.7 g/dL Final  . RDW 10/04/2013 14.5  12.3 - 15.4 % Final  . Platelets 10/04/2013 194  150 - 379 x10E3/uL Final  . Neutrophils Relative % 10/04/2013 63   Final  . Lymphs 10/04/2013 22   Final  . Monocytes 10/04/2013 11   Final  . Eos 10/04/2013 3   Final  . Basos 10/04/2013 1   Final  . Neutrophils Absolute 10/04/2013 5.2  1.4 - 7.0 x10E3/uL Final  . Lymphocytes Absolute 10/04/2013 1.8  0.7 - 3.1 x10E3/uL Final  . Monocytes Absolute 10/04/2013 0.9  0.1 - 0.9 x10E3/uL Final  . Eosinophils Absolute 10/04/2013 0.3  0.0 - 0.4 x10E3/uL Final  . Basophils Absolute 10/04/2013 0.0  0.0 - 0.2 x10E3/uL Final  . Immature Granulocytes 10/04/2013 0   Final  . Immature Grans (Abs) 10/04/2013 0.0  0.0 - 0.1 x10E3/uL Final  . TSH 10/04/2013 1.900  0.450 - 4.500 uIU/mL Final  Admission on 09/12/2013, Discharged on 09/12/2013  Component Date Value Ref Range Status  . WBC 09/12/2013 8.6  4.0 - 10.5 K/uL Final  . RBC 09/12/2013 3.83* 4.22 - 5.81  MIL/uL Final  . Hemoglobin 09/12/2013 11.9* 13.0 - 17.0 g/dL Final  . HCT 09/12/2013 37.2* 39.0 - 52.0 % Final  . MCV 09/12/2013 97.1  78.0 - 100.0 fL Final  . MCH 09/12/2013 31.1  26.0 - 34.0 pg Final  . MCHC 09/12/2013 32.0  30.0 - 36.0 g/dL Final  . RDW 09/12/2013 14.6  11.5 - 15.5 % Final  . Platelets 09/12/2013 180  150 - 400 K/uL Final  . Neutrophils Relative % 09/12/2013 62  43 - 77 % Final  . Neutro Abs 09/12/2013 5.3  1.7 - 7.7 K/uL Final  . Lymphocytes Relative 09/12/2013 22  12 - 46 % Final  . Lymphs Abs 09/12/2013 1.9  0.7 - 4.0 K/uL Final  . Monocytes Relative 09/12/2013 12  3 - 12 % Final  . Monocytes Absolute 09/12/2013 1.1* 0.1 - 1.0 K/uL Final  . Eosinophils Relative 09/12/2013 3  0 - 5 % Final  . Eosinophils Absolute 09/12/2013 0.3  0.0 - 0.7 K/uL Final  . Basophils Relative 09/12/2013 1  0 - 1 % Final  . Basophils Absolute 09/12/2013 0.0  0.0 - 0.1 K/uL Final  . Sodium 09/12/2013 141  137 - 147 mEq/L Final  . Potassium 09/12/2013 4.7  3.7 - 5.3 mEq/L Final  . Chloride 09/12/2013 100  96 - 112 mEq/L Final  . CO2 09/12/2013 27  19 - 32 mEq/L Final  . Glucose, Bld 09/12/2013 146* 70 - 99 mg/dL Final  . BUN 09/12/2013 30* 6 - 23 mg/dL Final  . Creatinine, Ser 09/12/2013 2.09* 0.50 - 1.35 mg/dL Final  . Calcium 09/12/2013 9.1  8.4 - 10.5 mg/dL Final  . GFR calc non Af Amer 09/12/2013 28* >90 mL/min Final  . GFR calc Af Amer 09/12/2013 33* >90 mL/min  Final   Comment: (NOTE)                          The eGFR has been calculated using the CKD EPI equation.                          This calculation has not been validated in all clinical situations.                          eGFR's persistently <90 mL/min signify possible Chronic Kidney                          Disease.      Assessment/Plan  1. Dementia with behavioral disturbance progressing  2. Chronic systolic heart failure compensated  3. Debility unchanged  4. ANEMIA, IRON DEFICIENCY recheck - CBC With  differential/Platelet; Future - CBC With differential/Platelet  5. Memory loss progressing  6. RENAL DISEASE, CHRONIC, STAGE III recheck - Basic metabolic panel; Future - Basic metabolic panel  7. Diabetes mellitus controlled - Hemoglobin A1c; Future - Basic metabolic panel; Future - Basic metabolic panel  8. Hypothyroid compensated  9. HYPERTENSION controlled  10. Hip pain improved  11. Need for diphtheria-tetanus-pertussis (Tdap) vaccine, adult/adolescent - Tdap (BOOSTRIX) 5-2.5-18.5 LF-MCG/0.5 injection; Inject 0.5 mLs into the muscle once.  Dispense: 0.5 mL; Refill: o

## 2013-11-07 NOTE — Patient Instructions (Signed)
Continue medications as listed 

## 2013-11-08 ENCOUNTER — Other Ambulatory Visit: Payer: Self-pay | Admitting: Internal Medicine

## 2013-11-08 LAB — CBC WITH DIFFERENTIAL
BASOS: 1 %
Basophils Absolute: 0 10*3/uL (ref 0.0–0.2)
Eos: 5 %
Eosinophils Absolute: 0.4 10*3/uL (ref 0.0–0.4)
HEMATOCRIT: 34.7 % — AB (ref 37.5–51.0)
Hemoglobin: 11.4 g/dL — ABNORMAL LOW (ref 12.6–17.7)
Immature Grans (Abs): 0 10*3/uL (ref 0.0–0.1)
Immature Granulocytes: 0 %
Lymphocytes Absolute: 1.9 10*3/uL (ref 0.7–3.1)
Lymphs: 22 %
MCH: 30.2 pg (ref 26.6–33.0)
MCHC: 32.9 g/dL (ref 31.5–35.7)
MCV: 92 fL (ref 79–97)
MONOCYTES: 13 %
Monocytes Absolute: 1.1 10*3/uL — ABNORMAL HIGH (ref 0.1–0.9)
NEUTROS ABS: 4.9 10*3/uL (ref 1.4–7.0)
Neutrophils Relative %: 59 %
Platelets: 155 10*3/uL (ref 150–379)
RBC: 3.77 x10E6/uL — ABNORMAL LOW (ref 4.14–5.80)
RDW: 14.1 % (ref 12.3–15.4)
WBC: 8.3 10*3/uL (ref 3.4–10.8)

## 2013-11-08 LAB — BASIC METABOLIC PANEL
BUN/Creatinine Ratio: 14 (ref 10–22)
BUN: 28 mg/dL — AB (ref 8–27)
CO2: 22 mmol/L (ref 18–29)
CREATININE: 2.01 mg/dL — AB (ref 0.76–1.27)
Calcium: 8.8 mg/dL (ref 8.6–10.2)
Chloride: 98 mmol/L (ref 97–108)
GFR, EST AFRICAN AMERICAN: 35 mL/min/{1.73_m2} — AB (ref >59)
GFR, EST NON AFRICAN AMERICAN: 30 mL/min/{1.73_m2} — AB (ref >59)
GLUCOSE: 111 mg/dL — AB (ref 65–99)
Potassium: 4.8 mmol/L (ref 3.5–5.2)
Sodium: 140 mmol/L (ref 134–144)

## 2013-11-20 ENCOUNTER — Emergency Department (HOSPITAL_COMMUNITY)
Admission: EM | Admit: 2013-11-20 | Discharge: 2013-11-21 | Disposition: A | Discharge status: Home / Self Care | Payer: Medicare Other | Attending: Emergency Medicine | Admitting: Emergency Medicine

## 2013-11-20 ENCOUNTER — Encounter (HOSPITAL_COMMUNITY): Payer: Self-pay | Admitting: Emergency Medicine

## 2013-11-20 DIAGNOSIS — L03115 Cellulitis of right lower limb: Secondary | ICD-10-CM

## 2013-11-20 DIAGNOSIS — Z86718 Personal history of other venous thrombosis and embolism: Secondary | ICD-10-CM | POA: Insufficient documentation

## 2013-11-20 DIAGNOSIS — I209 Angina pectoris, unspecified: Secondary | ICD-10-CM | POA: Insufficient documentation

## 2013-11-20 DIAGNOSIS — H9190 Unspecified hearing loss, unspecified ear: Secondary | ICD-10-CM | POA: Insufficient documentation

## 2013-11-20 DIAGNOSIS — I129 Hypertensive chronic kidney disease with stage 1 through stage 4 chronic kidney disease, or unspecified chronic kidney disease: Secondary | ICD-10-CM | POA: Insufficient documentation

## 2013-11-20 DIAGNOSIS — L03119 Cellulitis of unspecified part of limb: Principal | ICD-10-CM

## 2013-11-20 DIAGNOSIS — Z7982 Long term (current) use of aspirin: Secondary | ICD-10-CM | POA: Insufficient documentation

## 2013-11-20 DIAGNOSIS — Z9861 Coronary angioplasty status: Secondary | ICD-10-CM | POA: Insufficient documentation

## 2013-11-20 DIAGNOSIS — Z794 Long term (current) use of insulin: Secondary | ICD-10-CM | POA: Insufficient documentation

## 2013-11-20 DIAGNOSIS — E119 Type 2 diabetes mellitus without complications: Secondary | ICD-10-CM | POA: Insufficient documentation

## 2013-11-20 DIAGNOSIS — Z862 Personal history of diseases of the blood and blood-forming organs and certain disorders involving the immune mechanism: Secondary | ICD-10-CM | POA: Insufficient documentation

## 2013-11-20 DIAGNOSIS — I2510 Atherosclerotic heart disease of native coronary artery without angina pectoris: Secondary | ICD-10-CM | POA: Insufficient documentation

## 2013-11-20 DIAGNOSIS — L02419 Cutaneous abscess of limb, unspecified: Secondary | ICD-10-CM | POA: Insufficient documentation

## 2013-11-20 DIAGNOSIS — K219 Gastro-esophageal reflux disease without esophagitis: Secondary | ICD-10-CM | POA: Insufficient documentation

## 2013-11-20 DIAGNOSIS — N183 Chronic kidney disease, stage 3 unspecified: Secondary | ICD-10-CM | POA: Insufficient documentation

## 2013-11-20 DIAGNOSIS — F0390 Unspecified dementia without behavioral disturbance: Secondary | ICD-10-CM | POA: Insufficient documentation

## 2013-11-20 DIAGNOSIS — I2789 Other specified pulmonary heart diseases: Secondary | ICD-10-CM | POA: Insufficient documentation

## 2013-11-20 DIAGNOSIS — Z872 Personal history of diseases of the skin and subcutaneous tissue: Secondary | ICD-10-CM | POA: Insufficient documentation

## 2013-11-20 DIAGNOSIS — Z87891 Personal history of nicotine dependence: Secondary | ICD-10-CM | POA: Insufficient documentation

## 2013-11-20 DIAGNOSIS — Z79899 Other long term (current) drug therapy: Secondary | ICD-10-CM | POA: Insufficient documentation

## 2013-11-20 DIAGNOSIS — Z9889 Other specified postprocedural states: Secondary | ICD-10-CM | POA: Insufficient documentation

## 2013-11-20 DIAGNOSIS — I4891 Unspecified atrial fibrillation: Secondary | ICD-10-CM | POA: Insufficient documentation

## 2013-11-20 DIAGNOSIS — I5021 Acute systolic (congestive) heart failure: Secondary | ICD-10-CM | POA: Insufficient documentation

## 2013-11-20 DIAGNOSIS — E039 Hypothyroidism, unspecified: Secondary | ICD-10-CM | POA: Insufficient documentation

## 2013-11-20 NOTE — ED Notes (Signed)
Pt c/o swelling and redness to right lower leg.  Unsure of onset.  Area also starting to develop blisters

## 2013-11-20 NOTE — ED Provider Notes (Signed)
CSN: [id number]     Arrival date & time 11/20/13  1949 History   First MD Initiated Contact with Patient 11/20/13 2317     Chief Complaint  Patient presents with  . Leg Pain     (Consider location/radiation/quality/duration/timing/severity/associated sxs/prior Treatment) HPI Comments: Patient is an 80 yo M PMHx significant for CHF, HTN, A fib, HTN, Hx of DVT, CAD, cardiomyopathy, hypokalemia, DM, Anemia, CKD, Dementia presenting to the ED for continued right lower extremity swelling with redness since being evaluated in the ER on 09/12/13 and treated for cellulitis with 1g of Vancomycin in the ER with doxycycline as outpatient. Patient's family and home health nurses have noticed worsening wounds to the extremity as well. No history of fevers per family. Patient is not complaining of any pain. Patient is a level V caveat d/t dementia.   Patient is a 80 y.o. male presenting with leg pain.  Leg Pain   Past Medical History  Diagnosis Date  . SYSTOLIC HEART FAILURE, ACUTE   . PULMONARY HYPERTENSION   . PAROXYSMAL ATRIAL FIBRILLATION   . HYPERTENSION   . DVT   . CARDIOMYOPATHY   . CAD, NATIVE VESSEL   . TOBACCO USE, QUIT   . RENAL DISEASE, CHRONIC, STAGE III   . MURAL THROMBUS, APEX OF HEART   . Long term current use of anticoagulant   . HYPOKALEMIA   . DM   . ANEMIA, IRON DEFICIENCY   . CHF (congestive heart failure)   . Senile cataract, unspecified   . Other specified disease of nail   . Unspecified hearing loss   . Chronic kidney disease, stage II (mild)   . Memory loss   . Unspecified urinary incontinence   . Abnormality of gait   . Unspecified hypothyroidism   . Unspecified vitamin D deficiency   . Anemia, unspecified   . Coronary atherosclerosis of native coronary artery   . Acute systolic heart failure   . Primary pulmonary hypertension   . Cardiomyopathy in other diseases classified elsewhere   . Other seborrheic dermatitis   . Other and unspecified angina pectoris     . Other and unspecified hyperlipidemia   . Slowing of urinary stream   . Type II or unspecified type diabetes mellitus without mention of complication, not stated as uncontrolled   . Unspecified essential hypertension   . Esophageal reflux   . Edema   . Shortness of breath   . Hydrocele, unspecified   . Impotence of organic origin   . Edema    Past Surgical History  Procedure Laterality Date  . Cholecystectomy  1985  . Placement of a veriflex bare-metal sten    . Coronary angioplasty with stent placement      VeriFLEX bare-metal stent  . Right heart cardiac catherterization    . Coronary anteriogram  05/2009  . Doppler echocardiography  05/2009   Family History  Problem Relation Age of Onset  . Diabetes Mother   . Stroke Father   . Cancer Sister    History  Substance Use Topics  . Smoking status: Former Smoker -- 1.00 packs/day for 50 years    Types: Cigarettes    Quit date: 09/06/1998  . Smokeless tobacco: Never Used     Comment: 12/06/2012 "smoked from 1950 to 2000"  . Alcohol Use: 8.4 oz/week    14 Cans of beer per week     Comment: 12/06/2012 "2-3 beers/day; have whiskey q now and then"    Review of Systems  Unable to perform ROS: Dementia  Skin: Positive for color change and wound.      Allergies  Review of patient's allergies indicates no known allergies.  Home Medications   Current Outpatient Rx  Name  Route  Sig  Dispense  Refill  . acetaminophen (TYLENOL) 500 MG tablet   Oral   Take 500 mg by mouth every 6 (six) hours as needed for moderate pain.         Marland Kitchen amiodarone (PACERONE) 200 MG tablet   Oral   Take 0.5 tablets (100 mg total) by mouth daily.         Marland Kitchen aspirin EC 81 MG tablet   Oral   Take 81 mg by mouth daily.         Marland Kitchen donepezil (ARICEPT) 5 MG tablet   Oral   Take 5 mg by mouth at bedtime.          . furosemide (LASIX) 20 MG tablet   Oral   Take 40 mg by mouth daily.         . insulin glargine (LANTUS) 100 UNIT/ML  injection   Subcutaneous   Inject 5 Units into the skin at bedtime.         . isosorbide mononitrate (IMDUR) 30 MG 24 hr tablet   Oral   Take 30 mg by mouth daily.         . isosorbide mononitrate (IMDUR) 30 MG 24 hr tablet   Oral   Take 30 mg by mouth daily.         Marland Kitchen levothyroxine (SYNTHROID, LEVOTHROID) 50 MCG tablet   Oral   Take 1 tablet (50 mcg total) by mouth daily.   90 tablet   3   . losartan (COZAAR) 25 MG tablet   Oral   Take 12.5 mg by mouth daily.         . mirtazapine (REMERON) 15 MG tablet      Take 1 tablet by mouth at bedtime for appetite stimulation         . nebivolol (BYSTOLIC) 2.5 MG tablet   Oral   Take 2.5 mg by mouth daily.         . nitroGLYCERIN (NITROSTAT) 0.4 MG SL tablet   Sublingual   Place 1 tablet (0.4 mg total) under the tongue every 5 (five) minutes as needed for chest pain.   100 tablet   5   . omeprazole (PRILOSEC) 40 MG capsule   Oral   Take 40 mg by mouth daily.         . risperiDONE (RISPERDAL) 0.5 MG tablet   Oral   Take 0.5 mg by mouth at bedtime.         . traMADol (ULTRAM) 50 MG tablet   Oral   Take 50 mg by mouth every 6 (six) hours as needed for moderate pain.         . B-D ULTRAFINE III SHORT PEN 31G X 8 MM MISC               . sulfamethoxazole-trimethoprim (SEPTRA DS) 800-160 MG per tablet   Oral   Take 1 tablet by mouth every 12 (twelve) hours.   20 tablet   0   . Tdap (BOOSTRIX) 5-2.5-18.5 LF-MCG/0.5 injection   Intramuscular   Inject 0.5 mLs into the muscle once.   0.5 mL   o    BP 138/64  Pulse 65  Temp(Src) 98.7 F (37.1 C) (Oral)  Resp  20  Ht 5\' 11"  (1.803 m)  Wt 180 lb 6 oz (81.818 kg)  BMI 25.17 kg/m2  SpO2 94% Physical Exam  Nursing note and vitals reviewed. Constitutional: He is oriented to person, place, and time. He appears well-developed and well-nourished. No distress.  HENT:  Head: Normocephalic and atraumatic.  Right Ear: External ear normal.  Left Ear:  External ear normal.  Nose: Nose normal.  Mouth/Throat: Oropharynx is clear and moist.  Eyes: Conjunctivae are normal.  Neck: Normal range of motion. Neck supple.  Cardiovascular: Normal rate, regular rhythm and normal heart sounds.   Cap refill < 3 sec  Pulmonary/Chest: Effort normal and breath sounds normal. No respiratory distress. He exhibits no tenderness.  Abdominal: Soft. Bowel sounds are normal. There is no tenderness.  Musculoskeletal: He exhibits edema (1+ pitting edema left LE 2+ pitting edema RLE).  Neurological: He is alert and oriented to person, place, and time.  Skin: Skin is warm and dry. He is not diaphoretic. There is erythema.     Warm erythematous area noted to RLE with associated swelling. Three small wounds noted within erythema, all areas are smaller than quarter sized. No bleeding from areas. Scant amount of yellow drainage noted. Non-TTP  Psychiatric: He has a normal mood and affect.    ED Course  Procedures (including critical care time) Medications  sulfamethoxazole-trimethoprim (BACTRIM DS) 800-160 MG per tablet 1 tablet (1 tablet Oral Given 11/21/13 0213)    Medications  sulfamethoxazole-trimethoprim (BACTRIM DS) 800-160 MG per tablet 1 tablet (1 tablet Oral Given 11/21/13 0213)    Labs Review Labs Reviewed  CBC WITH DIFFERENTIAL - Abnormal; Notable for the following:    RBC 3.70 (*)    Hemoglobin 11.6 (*)    HCT 34.6 (*)    All other components within normal limits  BASIC METABOLIC PANEL - Abnormal; Notable for the following:    Glucose, Bld 154 (*)    BUN 33 (*)    Creatinine, Ser 2.30 (*)    GFR calc non Af Amer 25 (*)    GFR calc Af Amer 29 (*)    All other components within normal limits   Imaging Review Dg Tibia/fibula Right  11/21/2013   CLINICAL DATA:  Right lower leg pain inflammation.  EXAM: RIGHT TIBIA AND FIBULA - 2 VIEW  COMPARISON:  Plain films right lower leg 09/12/2013.  FINDINGS: No bony or joint abnormality is identified. Soft  tissues of the lower leg appear mildly swollen. No soft tissue gas collection is identified. A tiny radiopaque foreign body is seen in the medial and posterior subcutaneous soft tissues in the superior aspect of the lower leg as on the prior study.  IMPRESSION: Soft tissue swelling without bony joint abnormality.  Tiny radiopaque foreign body in the subcutaneous tissues of the medial and posterior right lower leg is unchanged.   Electronically Signed   By: Drusilla Kannerhomas  Dalessio M.D.   On: 11/21/2013 00:28     EKG Interpretation None      MDM   Final diagnoses:  Cellulitis of right lower extremity    Filed Vitals:   11/21/13 0215  BP: 138/64  Pulse: 65  Temp:   Resp:     Afebrile, NAD, non-toxic appearing, AAOx4.  Suspect uncomplicated cellulitis based on limited area of involvement, minimal pain, no systemic signs of illness (eg, fever, chills, dehydration, altered mental status, tachypnea, tachycardia, hypotension). PE reveals redness, swelling, mildly tender, warm to touch. Skin intact, No bleeding. No bullae. Non purulent.  Non circumferential.  Borders are not elevated or sharply demarcated. X-ray reviewed. Do not feel patient requires inpatient treatment at this time. Will prescribed Septra to cover for MRSA. Return precautions discussed. Family agreeable to plan. Patient is stable at time of discharge.  Patient d/w with Dr. Hyacinth Meeker, agrees with plan.        Jeannetta Ellis, PA-C 11/21/13 [phone]

## 2013-11-21 ENCOUNTER — Other Ambulatory Visit: Payer: Self-pay | Admitting: Internal Medicine

## 2013-11-21 ENCOUNTER — Emergency Department (HOSPITAL_COMMUNITY): Payer: Medicare Other

## 2013-11-21 ENCOUNTER — Other Ambulatory Visit: Payer: Self-pay | Admitting: Nurse Practitioner

## 2013-11-21 LAB — CBC WITH DIFFERENTIAL/PLATELET
Basophils Absolute: 0 10*3/uL (ref 0.0–0.1)
Basophils Relative: 0 % (ref 0–1)
EOS PCT: 3 % (ref 0–5)
Eosinophils Absolute: 0.2 10*3/uL (ref 0.0–0.7)
HCT: 34.6 % — ABNORMAL LOW (ref 39.0–52.0)
HEMOGLOBIN: 11.6 g/dL — AB (ref 13.0–17.0)
LYMPHS ABS: 1.7 10*3/uL (ref 0.7–4.0)
LYMPHS PCT: 20 % (ref 12–46)
MCH: 31.4 pg (ref 26.0–34.0)
MCHC: 33.5 g/dL (ref 30.0–36.0)
MCV: 93.5 fL (ref 78.0–100.0)
Monocytes Absolute: 0.9 10*3/uL (ref 0.1–1.0)
Monocytes Relative: 11 % (ref 3–12)
Neutro Abs: 5.3 10*3/uL (ref 1.7–7.7)
Neutrophils Relative %: 65 % (ref 43–77)
Platelets: 186 10*3/uL (ref 150–400)
RBC: 3.7 MIL/uL — AB (ref 4.22–5.81)
RDW: 13.7 % (ref 11.5–15.5)
WBC: 8.1 10*3/uL (ref 4.0–10.5)

## 2013-11-21 LAB — BASIC METABOLIC PANEL
BUN: 33 mg/dL — ABNORMAL HIGH (ref 6–23)
CO2: 27 meq/L (ref 19–32)
Calcium: 9 mg/dL (ref 8.4–10.5)
Chloride: 99 mEq/L (ref 96–112)
Creatinine, Ser: 2.3 mg/dL — ABNORMAL HIGH (ref 0.50–1.35)
GFR calc Af Amer: 29 mL/min — ABNORMAL LOW (ref >90)
GFR calc non Af Amer: 25 mL/min — ABNORMAL LOW (ref >90)
GLUCOSE: 154 mg/dL — AB (ref 70–99)
Potassium: 4.1 mEq/L (ref 3.7–5.3)
SODIUM: 142 meq/L (ref 137–147)

## 2013-11-21 MED ORDER — SULFAMETHOXAZOLE-TMP DS 800-160 MG PO TABS
1.0000 | ORAL_TABLET | Freq: Once | ORAL | Status: AC
  Administered 2013-11-21: 1 via ORAL
  Filled 2013-11-21: qty 1

## 2013-11-21 MED ORDER — SULFAMETHOXAZOLE-TRIMETHOPRIM 800-160 MG PO TABS: 1.0000 | ORAL_TABLET | Freq: Two times a day (BID) | ORAL | Status: DC

## 2013-11-21 NOTE — Discharge Instructions (Signed)
Take the antibiotic twice daily for 10 days - see your doctor in 2 days for recheck

## 2013-11-21 NOTE — ED Provider Notes (Signed)
Bilateral lower she may swelling right greater than left, this is chronic, there is increased erythema, blistering lesions which have ruptured but no fevers, no vomiting, normal mental status. Patient is baseline demented.  On exam has mild erythema of the right lower extremity just above the ankle and onto the foot. This is warm, there is no purulent discharge, there is no significant tenderness, there is no decreased range of motion of the ankle.  The patient has evidence of mild early infection, he has recently finished antibiotics, will place on Bactrim, discharged home as he does not appear septic.  Medical screening examination/treatment/procedure(s) were conducted as a shared visit with non-physician practitioner(s) and myself.  I personally evaluated the patient during the encounter.        Vida RollerBrian D Gibril Mastro, MD 11/21/13 [phone]

## 2013-11-22 NOTE — Telephone Encounter (Signed)
yes

## 2013-11-22 NOTE — Telephone Encounter (Signed)
Last RF 09/26/13 with  Qty 90.   Is it okay to refill? Please advise. Thanks

## 2013-11-28 ENCOUNTER — Other Ambulatory Visit: Payer: Self-pay | Admitting: Internal Medicine

## 2013-12-01 NOTE — Progress Notes (Signed)
Patient ID: Joel Walker, male   DOB: Aug 29, 1930, 80 y.o.   MRN: [id number] No show

## 2013-12-20 IMAGING — CT CT HEAD W/O CM
6 of 7 series · 18 of 47 positions shown, 19 images · non-contrast
Comparison: MRI head 12/08/2012.  CT head 12/14/2012.

CT HEAD

CLINICAL DATA: Fall, head pain, neck pain.

CT HEAD WITHOUT CONTRAST
CT CERVICAL SPINE WITHOUT CONTRAST
TECHNIQUE: Multidetector CT imaging of the head and cervical spine
was performed following the standard protocol without intravenous
contrast.  Multiplanar CT image reconstructions of the cervical
spine were also generated.

[Series 2: brain · axial · 0.47mm/px · z∈[-68,-18]mm · 2 of 32 slices shown]
[im 11/32  brain]
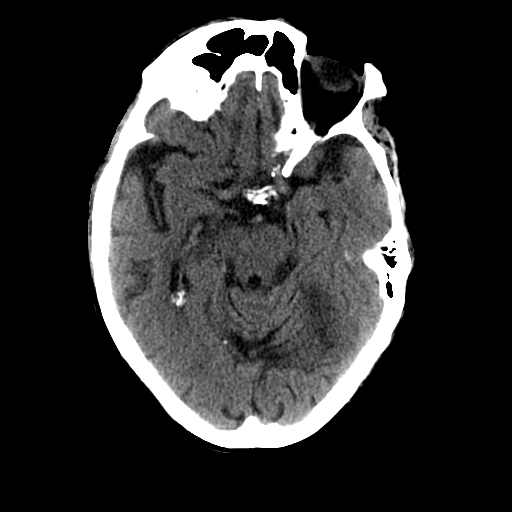
[im 21/32  brain]
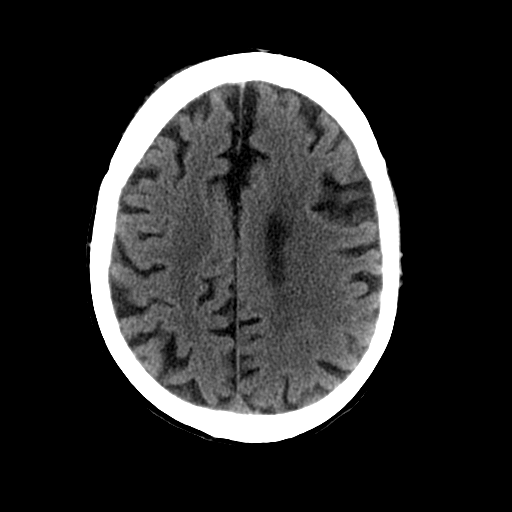

[Series 3: recon 2: brain · axial · 0.47mm/px · z∈[-90,+6]mm · 4 of 64 slices shown]
[im 13/64  brain]
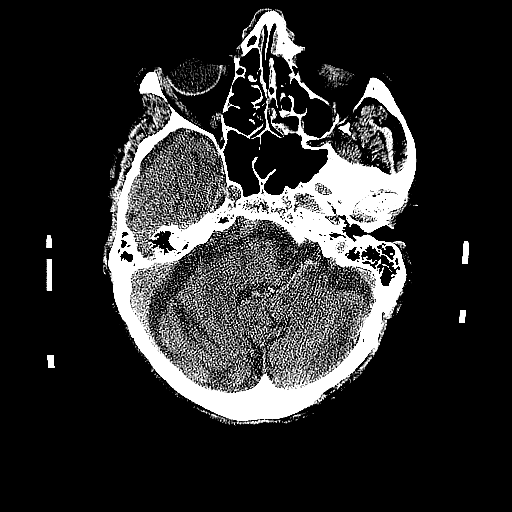
[im 26/64  brain]
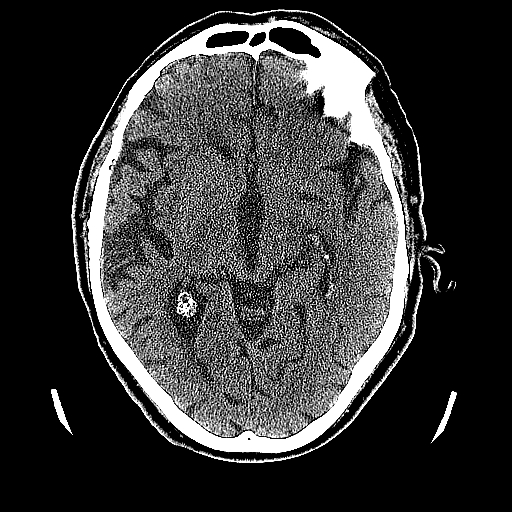
[im 38/64  brain]
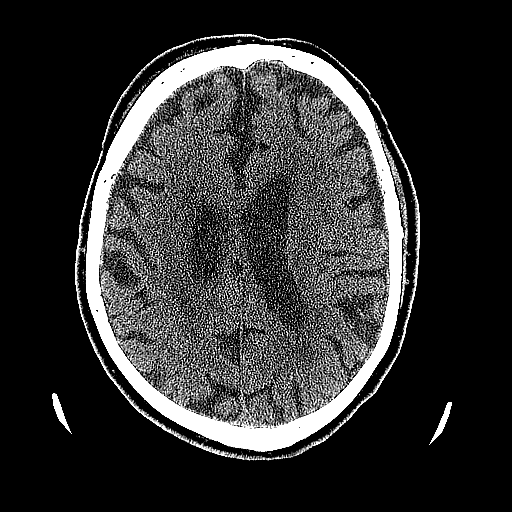
[im 51/64  brain]
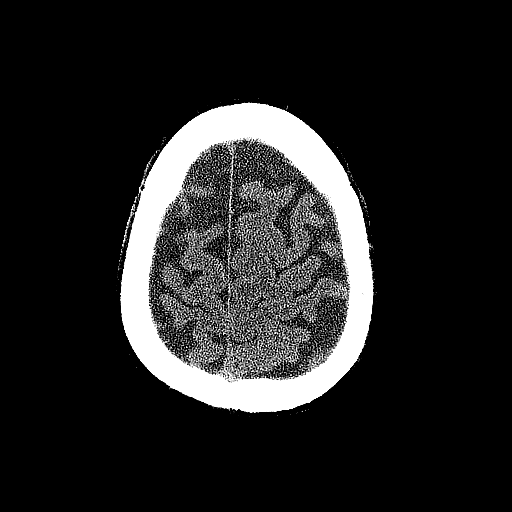

[Series 4: cervical spine · axial · 0.31mm/px · z∈[-268,-236]mm · 2 of 77 slices shown]
[im 13/77  brain]
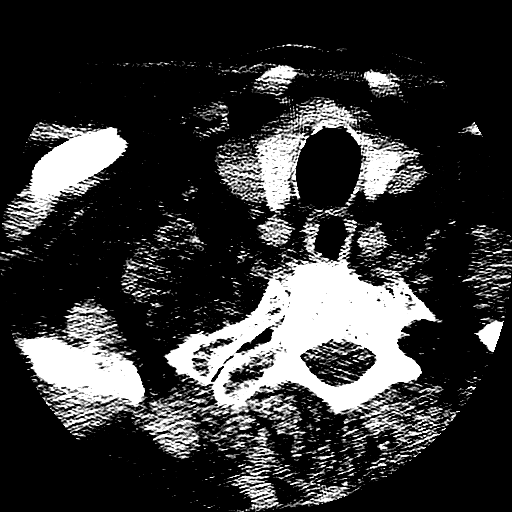
[im 26/77  brain]
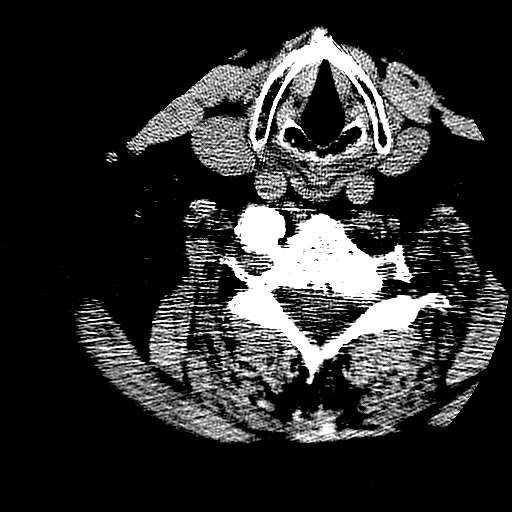

[Series 600: sagittal · sagittal · 0.38mm/px · 3 of 40 slices shown]
[im 31/40  brain]
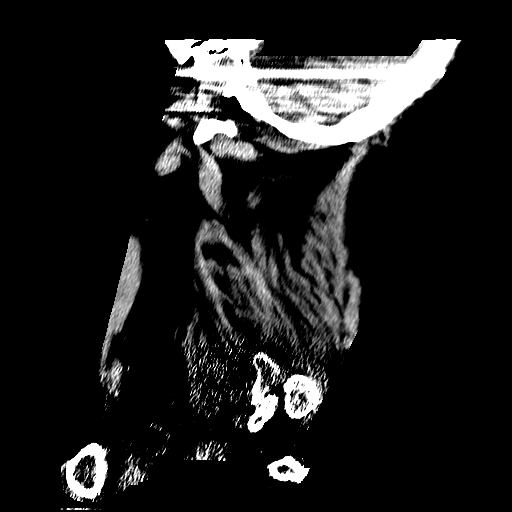
[im 33/40  brain]
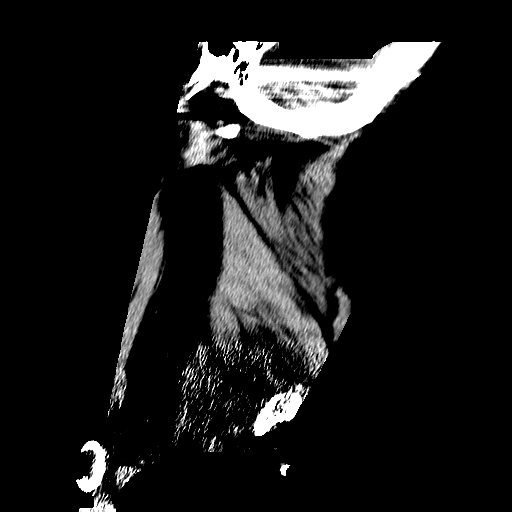
[im 35/40  brain]
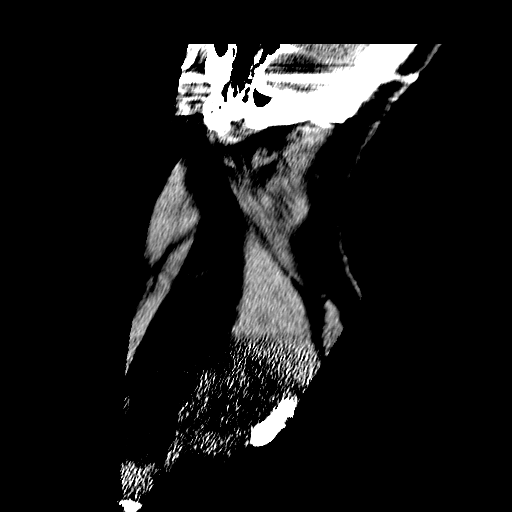

[Series 601: coronal · coronal · 0.38mm/px · 3 of 40 slices shown]
[im 14/40  brain]
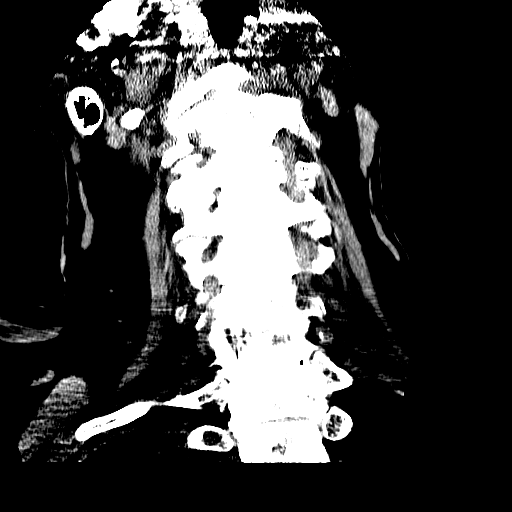
[im 18/40  brain]
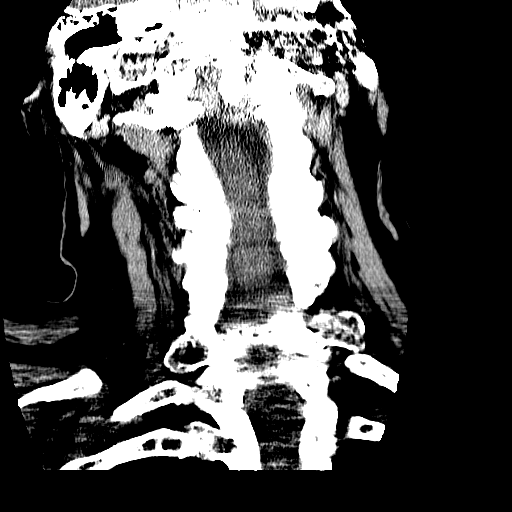
[im 22/40  brain]
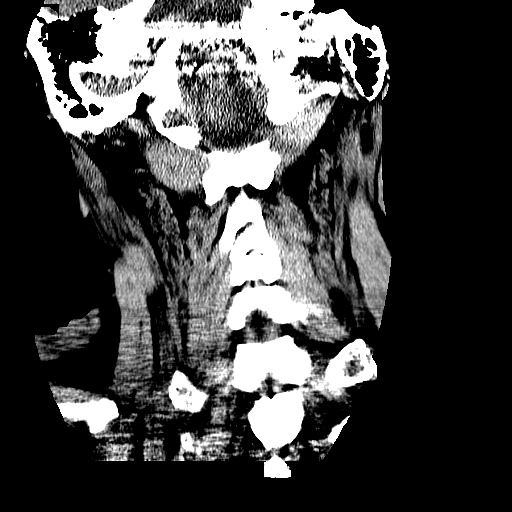

[Series 602: axial · axial · 0.38mm/px · z∈[-276,-179]mm · 4 of 64 slices shown, 5 images]
[im 13/64  brain]
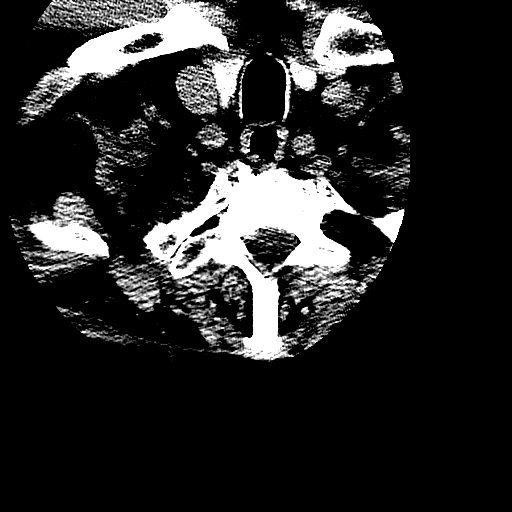
[im 13/64  bone]
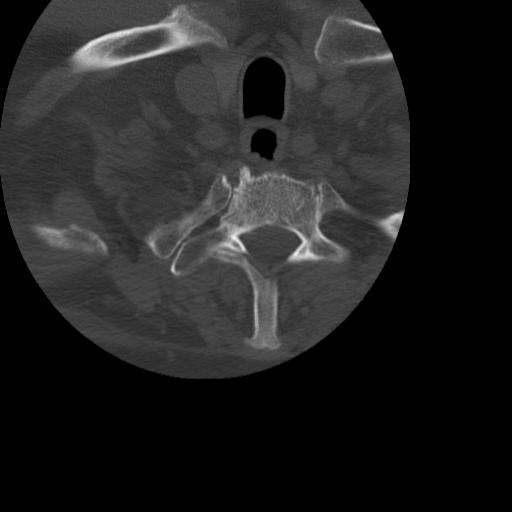
[im 26/64  brain]
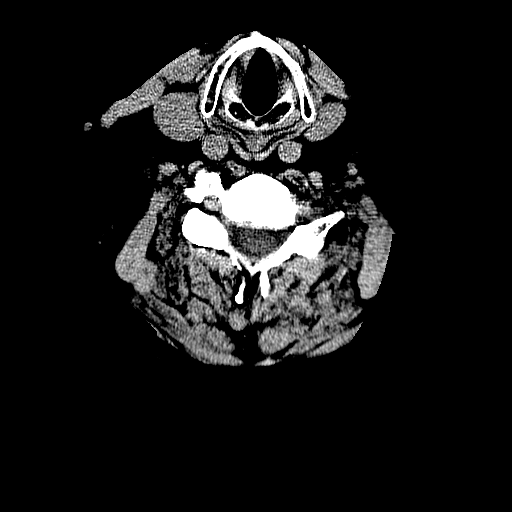
[im 38/64  brain]
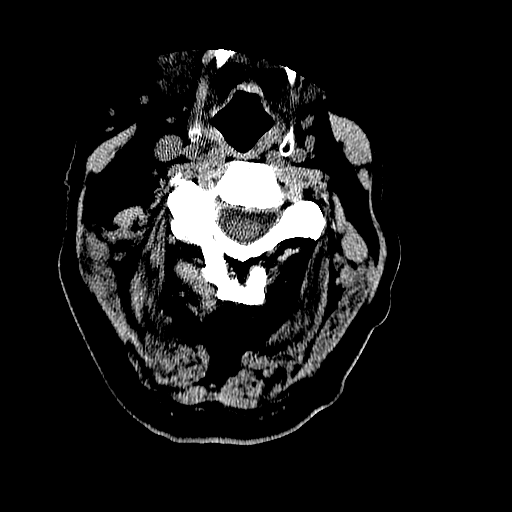
[im 51/64  brain]
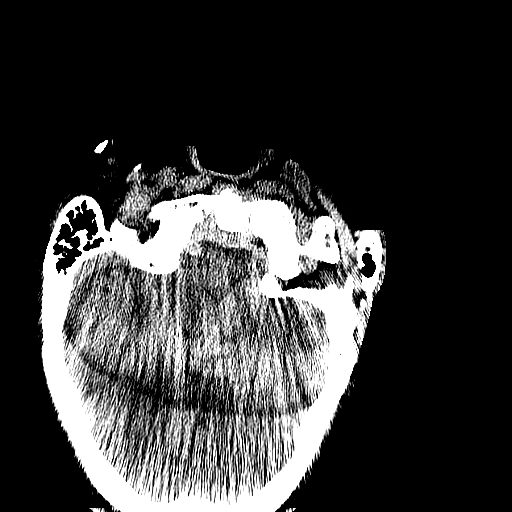

[18 of 47 positions shown; findings below may reference images not displayed]

FINDINGS: There is no evidence for acute infarction, intracranial
hemorrhage, mass lesion, hydrocephalus, or extra-axial fluid.
Moderate atrophy.  Chronic microvascular ischemic change.  Intact
calvarium. There is a right frontal scalp hematoma without
underlying skull fracture.  Clear paranasal sinuses.  Prominent
right frontal vascular groove.  No mastoid fluid.
IMPRESSION: Right frontal scalp hematoma.  No skull fracture or intracranial
hemorrhage.  The appearance of the brain and skull is stable
compared with 12/06/2012.

CT CERVICAL SPINE
FINDINGS: There is mild reversal of the normal cervical lordotic
curve.  There is no cervical spine fracture or traumatic
subluxation.  Severe disc space narrowing is present at C5-6 and C6-
7.  Facet mediated anterolisthesis at C4-5 and C5-6 is 1-2 mm.
There is a central protrusion at C4-5 which is likely chronic.
There is no prevertebral soft tissue swelling.  Mild pannus
surrounds the odontoid.  There is no lung apex lesion identified.
No worrisome neck masses are present.
IMPRESSION: Spondylosis.  No cervical spine fracture or traumatic subluxation.

## 2013-12-20 IMAGING — CR DG HIP COMPLETE 2+V*R*
3 series · 3 of 3 positions shown · non-contrast
Comparison: None.

CLINICAL DATA: Fall, pain

RIGHT HIP - COMPLETE 2+ VIEW

[t pelvis a.p.]
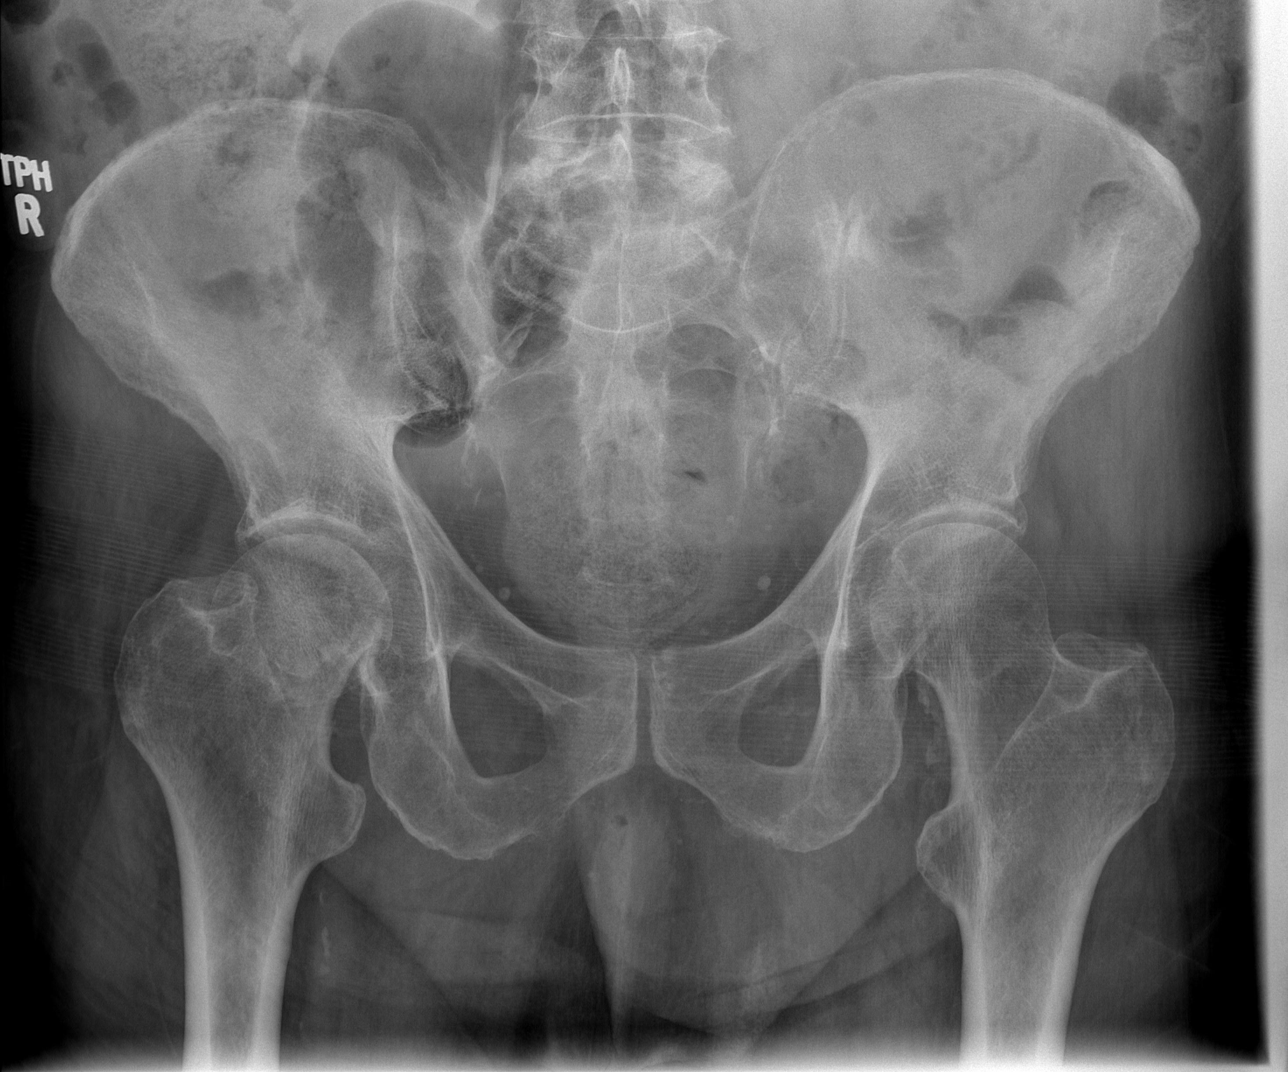

[t hip ap right]
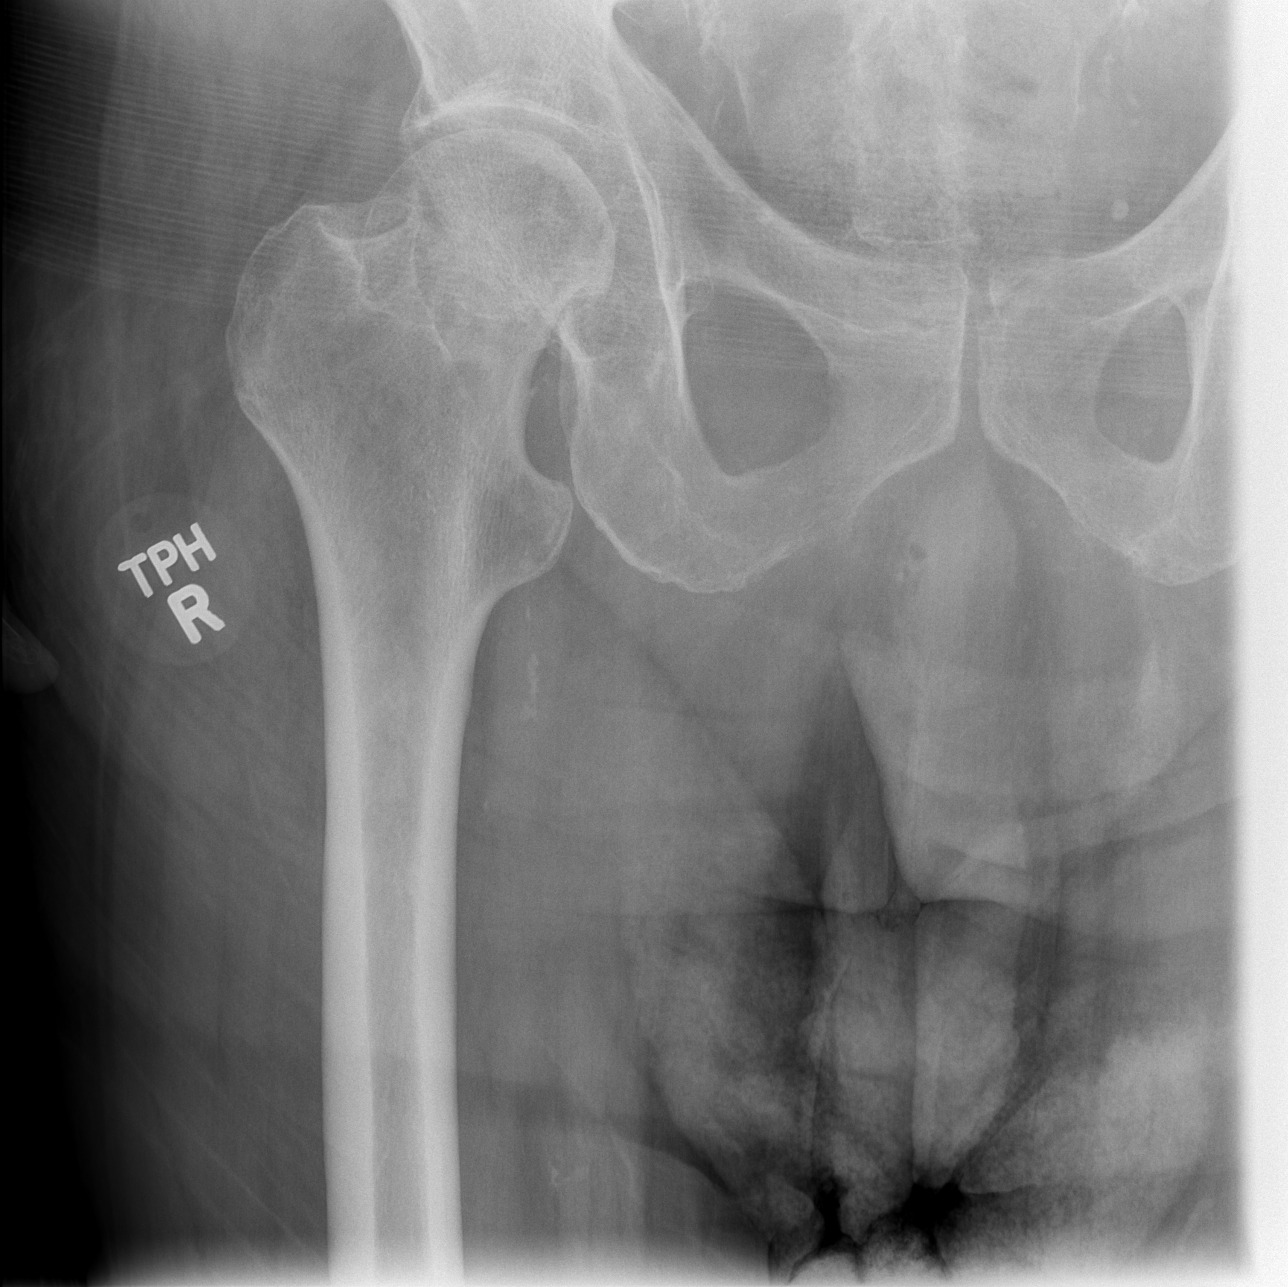

[t hip frog leg right]
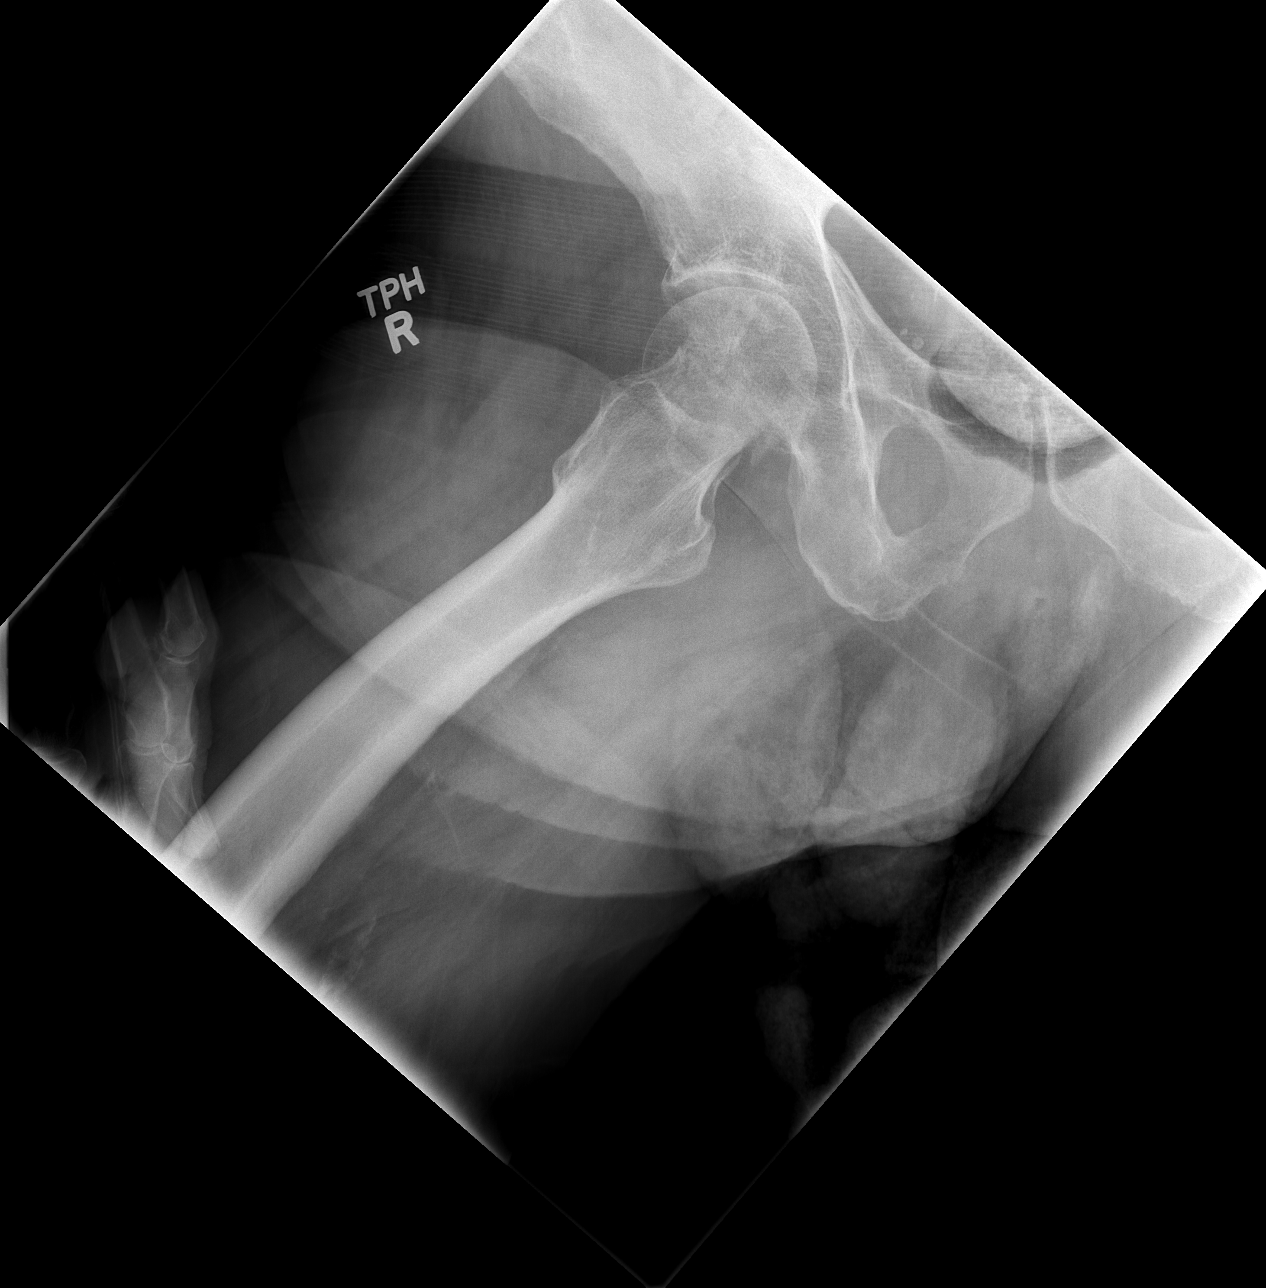

[3 of 3 positions shown; findings below may reference images not displayed]

FINDINGS: There is an impacted subcapital right hip fracture with
slight internal rotation.  There is no associated pelvic fracture.
There may be mild skeletal osteopenia. Mild to moderate vascular
calcification is noted.
IMPRESSION: Impacted subcapital right hip fracture.

## 2013-12-26 ENCOUNTER — Other Ambulatory Visit: Payer: Self-pay | Admitting: Internal Medicine

## 2014-01-11 ENCOUNTER — Other Ambulatory Visit: Payer: Self-pay | Admitting: Internal Medicine

## 2014-01-23 ENCOUNTER — Other Ambulatory Visit: Payer: Self-pay | Admitting: Nurse Practitioner

## 2014-02-11 ENCOUNTER — Other Ambulatory Visit: Payer: Medicare Other

## 2014-02-11 DIAGNOSIS — N183 Chronic kidney disease, stage 3 unspecified: Secondary | ICD-10-CM

## 2014-02-11 DIAGNOSIS — D509 Iron deficiency anemia, unspecified: Secondary | ICD-10-CM

## 2014-02-11 DIAGNOSIS — E119 Type 2 diabetes mellitus without complications: Secondary | ICD-10-CM

## 2014-02-12 LAB — BASIC METABOLIC PANEL
BUN/Creatinine Ratio: 14 (ref 10–22)
BUN: 29 mg/dL — ABNORMAL HIGH (ref 8–27)
CO2: 22 mmol/L (ref 18–29)
Calcium: 9.1 mg/dL (ref 8.6–10.2)
Chloride: 100 mmol/L (ref 97–108)
Creatinine, Ser: 2.03 mg/dL — ABNORMAL HIGH (ref 0.76–1.27)
GFR, EST AFRICAN AMERICAN: 34 mL/min/{1.73_m2} — AB (ref >59)
GFR, EST NON AFRICAN AMERICAN: 30 mL/min/{1.73_m2} — AB (ref >59)
Glucose: 128 mg/dL — ABNORMAL HIGH (ref 65–99)
Potassium: 3.9 mmol/L (ref 3.5–5.2)
Sodium: 142 mmol/L (ref 134–144)

## 2014-02-12 LAB — CBC WITH DIFFERENTIAL
Basophils Absolute: 0 10*3/uL (ref 0.0–0.2)
Basos: 0 %
EOS: 3 %
Eosinophils Absolute: 0.3 10*3/uL (ref 0.0–0.4)
HCT: 34.3 % — ABNORMAL LOW (ref 37.5–51.0)
Hemoglobin: 11.4 g/dL — ABNORMAL LOW (ref 12.6–17.7)
Immature Grans (Abs): 0 10*3/uL (ref 0.0–0.1)
Immature Granulocytes: 0 %
Lymphocytes Absolute: 2 10*3/uL (ref 0.7–3.1)
Lymphs: 26 %
MCH: 31 pg (ref 26.6–33.0)
MCHC: 33.2 g/dL (ref 31.5–35.7)
MCV: 93 fL (ref 79–97)
Monocytes Absolute: 0.7 10*3/uL (ref 0.1–0.9)
Monocytes: 9 %
Neutrophils Absolute: 4.7 10*3/uL (ref 1.4–7.0)
Neutrophils Relative %: 62 %
Platelets: 181 10*3/uL (ref 150–379)
RBC: 3.68 x10E6/uL — AB (ref 4.14–5.80)
RDW: 14.4 % (ref 12.3–15.4)
WBC: 7.7 10*3/uL (ref 3.4–10.8)

## 2014-02-12 LAB — HEMOGLOBIN A1C
Est. average glucose Bld gHb Est-mCnc: 128 mg/dL
Hgb A1c MFr Bld: 6.1 % — ABNORMAL HIGH (ref 4.8–5.6)

## 2014-02-13 ENCOUNTER — Ambulatory Visit (INDEPENDENT_AMBULATORY_CARE_PROVIDER_SITE_OTHER): Payer: Medicare Other | Admitting: Internal Medicine

## 2014-02-13 ENCOUNTER — Encounter: Payer: Self-pay | Admitting: Internal Medicine

## 2014-02-13 VITALS — BP 112/62 | HR 51 | Temp 97.6°F | Resp 20 | Ht 71.0 in | Wt 196.4 lb

## 2014-02-13 DIAGNOSIS — R5381 Other malaise: Secondary | ICD-10-CM

## 2014-02-13 DIAGNOSIS — I5022 Chronic systolic (congestive) heart failure: Secondary | ICD-10-CM

## 2014-02-13 DIAGNOSIS — I10 Essential (primary) hypertension: Secondary | ICD-10-CM

## 2014-02-13 DIAGNOSIS — E039 Hypothyroidism, unspecified: Secondary | ICD-10-CM

## 2014-02-13 DIAGNOSIS — F0391 Unspecified dementia with behavioral disturbance: Secondary | ICD-10-CM

## 2014-02-13 DIAGNOSIS — N183 Chronic kidney disease, stage 3 unspecified: Secondary | ICD-10-CM

## 2014-02-13 DIAGNOSIS — G252 Other specified forms of tremor: Secondary | ICD-10-CM

## 2014-02-13 DIAGNOSIS — E119 Type 2 diabetes mellitus without complications: Secondary | ICD-10-CM

## 2014-02-13 DIAGNOSIS — F03918 Unspecified dementia, unspecified severity, with other behavioral disturbance: Secondary | ICD-10-CM

## 2014-02-13 DIAGNOSIS — I509 Heart failure, unspecified: Secondary | ICD-10-CM

## 2014-02-13 DIAGNOSIS — R413 Other amnesia: Secondary | ICD-10-CM

## 2014-02-13 DIAGNOSIS — I2510 Atherosclerotic heart disease of native coronary artery without angina pectoris: Secondary | ICD-10-CM

## 2014-02-13 DIAGNOSIS — G250 Essential tremor: Secondary | ICD-10-CM | POA: Insufficient documentation

## 2014-02-13 DIAGNOSIS — R634 Abnormal weight loss: Secondary | ICD-10-CM

## 2014-02-13 DIAGNOSIS — D509 Iron deficiency anemia, unspecified: Secondary | ICD-10-CM

## 2014-02-13 MED ORDER — NEBIVOLOL HCL 2.5 MG PO TABS: ORAL_TABLET | ORAL | Status: DC

## 2014-02-13 MED ORDER — INSULIN GLARGINE 100 UNIT/ML SOLOSTAR PEN: PEN_INJECTOR | SUBCUTANEOUS | Status: DC

## 2014-02-13 NOTE — Progress Notes (Signed)
Patient ID: Joel Walker, male   DOB: 07/27/1930, 80 y.o.   MRN: [id number]    Location:    [facility]  Place of Service:  OFFICE    No Known Allergies  Chief Complaint  Patient presents with  . Follow-up     left foot has a dark area    HPI:  Bruised area on the left foot.   Diabetes mellitus - controlled  Debility: continues to be completely dependent on others to meet all ADL  CAD, NATIVE VESSEL: no angine  ANEMIA, IRON DEFICIENCY: Hgb stable at 11.4  HYPERTENSION - controlled  Hypothyroid - compensated Loss of weight  Memory loss: unchanged  Dementia with behavioral disturbance: unchanged  Congestive heart failure, unspecified: compensated  Chronic systolic heart failure: compensated  RENAL DISEASE, CHRONIC, STAGE III -stable  Tremor, essential: unchanged    Medications: Patient's Medications  New Prescriptions   No medications on file  Previous Medications   ACETAMINOPHEN (TYLENOL) 500 MG TABLET    Take 500 mg by mouth every 6 (six) hours as needed for moderate pain.   AMIODARONE (PACERONE) 200 MG TABLET    Take 0.5 tablets (100 mg total) by mouth daily.   ASPIRIN EC 81 MG TABLET    Take 81 mg by mouth daily.   B-D ULTRAFINE III SHORT PEN 31G X 8 MM MISC       BYSTOLIC 2.5 MG TABLET    TAKE 1 TABLET BY MOUTH EVERY DAY   DONEPEZIL (ARICEPT) 5 MG TABLET    Take 5 mg by mouth at bedtime.    FUROSEMIDE (LASIX) 20 MG TABLET    Take 40 mg by mouth daily.   INSULIN GLARGINE (LANTUS) 100 UNIT/ML INJECTION    Inject 5 Units into the skin at bedtime.   ISOSORBIDE MONONITRATE (IMDUR) 30 MG 24 HR TABLET    Take 30 mg by mouth daily.   LEVOTHYROXINE (SYNTHROID, LEVOTHROID) 50 MCG TABLET    TAKE 1 TABLET BY MOUTH ONCE DAILY   LEVOTHYROXINE (SYNTHROID, LEVOTHROID) 50 MCG TABLET    TAKE 1 TABLET BY MOUTH ONCE DAILY   LOSARTAN (COZAAR) 25 MG TABLET    TAKE 1/2 TABLET BY MOUTH EVERY DAY   MIRTAZAPINE (REMERON) 15 MG TABLET    Take 1 tablet by mouth at bedtime for  appetite stimulation   NEBIVOLOL (BYSTOLIC) 2.5 MG TABLET    Take 2.5 mg by mouth daily.   NITROGLYCERIN (NITROSTAT) 0.4 MG SL TABLET    Place 1 tablet (0.4 mg total) under the tongue every 5 (five) minutes as needed for chest pain.   OMEPRAZOLE (PRILOSEC) 40 MG CAPSULE    Take 40 mg by mouth daily.   OMEPRAZOLE (PRILOSEC) 40 MG CAPSULE    TAKE 1 CAPSULE (40 MG TOTAL) BY MOUTH DAILY.   RISPERIDONE (RISPERDAL) 0.5 MG TABLET    TAKE 1 TABLET BY MOUTH EVERY DAY AT BEDTIME   SULFAMETHOXAZOLE-TRIMETHOPRIM (SEPTRA DS) 800-160 MG PER TABLET    Take 1 tablet by mouth every 12 (twelve) hours.   TDAP (BOOSTRIX) 5-2.5-18.5 LF-MCG/0.5 INJECTION    Inject 0.5 mLs into the muscle once.   TRAMADOL (ULTRAM) 50 MG TABLET    TAKE 1 TABLET BY MOUTH EVERY 6 HOURS AS NEEDED FOR PAIN ONLY  Modified Medications   No medications on file  Discontinued Medications   DONEPEZIL (ARICEPT) 5 MG TABLET    TAKE 1 TABLET BY MOUTH AT BEDTIME   ISOSORBIDE MONONITRATE (IMDUR) 30 MG 24 HR TABLET  Take 30 mg by mouth daily.   RISPERIDONE (RISPERDAL) 0.5 MG TABLET    Take 0.5 mg by mouth at bedtime.     Review of Systems  Constitutional: Positive for appetite change and fatigue.  HENT: Positive for hearing loss.   Eyes: Negative.   Respiratory: Negative for cough, chest tightness and shortness of breath.   Cardiovascular: Positive for leg swelling. Negative for chest pain and palpitations.  Gastrointestinal: Negative.   Endocrine: Negative.   Genitourinary: Negative for dysuria, urgency, decreased urine volume and enuresis.  Musculoskeletal: Positive for arthralgias.  Skin:       Previously blistered areas on both buttocks near the intergluteal crease are healing.  Neurological: Positive for tremors and weakness. Negative for dizziness, seizures, syncope, speech difficulty and numbness.  Hematological: Negative.   Psychiatric/Behavioral: Positive for behavioral problems, confusion and agitation.       Impulsive.  Independent nature, bt argumentative.    Filed Vitals:   02/13/14 1354  BP: 112/62  Pulse: 51  Temp: 97.6 F (36.4 C)  TempSrc: Oral  Resp: 20  Height: _0  (1.803 m)  Weight: 196 lb 6.4 oz (89.086 kg)  SpO2: 95%   Body mass index is 27.4 kg/(m^2).  Physical Exam  Constitutional: No distress.  Lost weight.  Eyes:  Corrective lenses  Neck: Normal range of motion. Neck supple. No JVD present. No tracheal deviation present. No thyromegaly present.  Cardiovascular: Normal rate, regular rhythm, normal heart sounds and intact distal pulses.  Exam reveals no friction rub.   No murmur heard. Pulmonary/Chest: Effort normal and breath sounds normal. No respiratory distress. He has no wheezes. He has no rales. He exhibits no tenderness.  Abdominal: Soft. Bowel sounds are normal. He exhibits no distension and no mass. There is no tenderness.  Musculoskeletal: He exhibits edema. He exhibits no tenderness.  Large left olecranon bursal enlargement. Painless. No inflammation.  Lymphadenopathy:    He has no cervical adenopathy.  Neurological: He is alert. No cranial nerve deficit. Coordination abnormal.  Very unsteady when standing.  Skin: Skin is warm and dry. No rash noted. No erythema. No pallor.  Lipoma right lateral chest wall. Healing areas of the buttocks near the intergluteal crease. Previously blistered.  Psychiatric:  Argumentative. Impulsive.      Labs reviewed: Appointment on 02/11/2014  Component Date Value Ref Range Status  . WBC 02/11/2014 7.7  3.4 - 10.8 x10E3/uL Final  . RBC 02/11/2014 3.68* 4.14 - 5.80 x10E6/uL Final  . Hemoglobin 02/11/2014 11.4* 12.6 - 17.7 g/dL Final  . HCT 02/11/2014 34.3* 37.5 - 51.0 % Final  . MCV 02/11/2014 93  79 - 97 fL Final  . MCH 02/11/2014 31.0  26.6 - 33.0 pg Final  . MCHC 02/11/2014 33.2  31.5 - 35.7 g/dL Final  . RDW 02/11/2014 14.4  12.3 - 15.4 % Final  . Platelets 02/11/2014 181  150 - 379 x10E3/uL Final  . Neutrophils  Relative % 02/11/2014 62   Final  . Lymphs 02/11/2014 26   Final  . Monocytes 02/11/2014 9   Final  . Eos 02/11/2014 3   Final  . Basos 02/11/2014 0   Final  . Neutrophils Absolute 02/11/2014 4.7  1.4 - 7.0 x10E3/uL Final  . Lymphocytes Absolute 02/11/2014 2.0  0.7 - 3.1 x10E3/uL Final  . Monocytes Absolute 02/11/2014 0.7  0.1 - 0.9 x10E3/uL Final  . Eosinophils Absolute 02/11/2014 0.3  0.0 - 0.4 x10E3/uL Final  . Basophils Absolute 02/11/2014 0.0  0.0 -  0.2 x10E3/uL Final  . Immature Granulocytes 02/11/2014 0   Final  . Immature Grans (Abs) 02/11/2014 0.0  0.0 - 0.1 x10E3/uL Final  . Hemoglobin A1C 02/11/2014 6.1* 4.8 - 5.6 % Final   Comment:          Increased risk for diabetes: 5.7 - 6.4                                   Diabetes: >6.4                                   Glycemic control for adults with diabetes: <7.0  . Estimated average glucose 02/11/2014 128   Final  . Glucose 02/11/2014 128* 65 - 99 mg/dL Final  . BUN 02/11/2014 29* 8 - 27 mg/dL Final  . Creatinine, Ser 02/11/2014 2.03* 0.76 - 1.27 mg/dL Final  . GFR calc non Af Amer 02/11/2014 30* >59 mL/min/1.73 Final  . GFR calc Af Amer 02/11/2014 34* >59 mL/min/1.73 Final  . BUN/Creatinine Ratio 02/11/2014 14  10 - 22 Final  . Sodium 02/11/2014 142  134 - 144 mmol/L Final  . Potassium 02/11/2014 3.9  3.5 - 5.2 mmol/L Final  . Chloride 02/11/2014 100  97 - 108 mmol/L Final  . CO2 02/11/2014 22  18 - 29 mmol/L Final  . Calcium 02/11/2014 9.1  8.6 - 10.2 mg/dL Final  Admission on 11/20/2013, Discharged on 11/21/2013  Component Date Value Ref Range Status  . WBC 11/20/2013 8.1  4.0 - 10.5 K/uL Final  . RBC 11/20/2013 3.70* 4.22 - 5.81 MIL/uL Final  . Hemoglobin 11/20/2013 11.6* 13.0 - 17.0 g/dL Final  . HCT 11/20/2013 34.6* 39.0 - 52.0 % Final  . MCV 11/20/2013 93.5  78.0 - 100.0 fL Final  . MCH 11/20/2013 31.4  26.0 - 34.0 pg Final  . MCHC 11/20/2013 33.5  30.0 - 36.0 g/dL Final  . RDW 11/20/2013 13.7  11.5 - 15.5 % Final    . Platelets 11/20/2013 186  150 - 400 K/uL Final  . Neutrophils Relative % 11/20/2013 65  43 - 77 % Final  . Neutro Abs 11/20/2013 5.3  1.7 - 7.7 K/uL Final  . Lymphocytes Relative 11/20/2013 20  12 - 46 % Final  . Lymphs Abs 11/20/2013 1.7  0.7 - 4.0 K/uL Final  . Monocytes Relative 11/20/2013 11  3 - 12 % Final  . Monocytes Absolute 11/20/2013 0.9  0.1 - 1.0 K/uL Final  . Eosinophils Relative 11/20/2013 3  0 - 5 % Final  . Eosinophils Absolute 11/20/2013 0.2  0.0 - 0.7 K/uL Final  . Basophils Relative 11/20/2013 0  0 - 1 % Final  . Basophils Absolute 11/20/2013 0.0  0.0 - 0.1 K/uL Final  . Sodium 11/20/2013 142  137 - 147 mEq/L Final  . Potassium 11/20/2013 4.1  3.7 - 5.3 mEq/L Final  . Chloride 11/20/2013 99  96 - 112 mEq/L Final  . CO2 11/20/2013 27  19 - 32 mEq/L Final  . Glucose, Bld 11/20/2013 154* 70 - 99 mg/dL Final  . BUN 11/20/2013 33* 6 - 23 mg/dL Final  . Creatinine, Ser 11/20/2013 2.30* 0.50 - 1.35 mg/dL Final  . Calcium 11/20/2013 9.0  8.4 - 10.5 mg/dL Final  . GFR calc non Af Amer 11/20/2013 25* >90 mL/min Final  . GFR calc Af Wyvonnia Lora  11/20/2013 29* >90 mL/min Final   Comment: (NOTE)                          The eGFR has been calculated using the CKD EPI equation.                          This calculation has not been validated in all clinical situations.                          eGFR's persistently <90 mL/min signify possible Chronic Kidney                          Disease.      Assessment/Plan  1. Diabetes mellitus controlled - Comprehensive metabolic panel; Future - Hemoglobin A1c; Future - Insulin Glargine (LANTUS SOLOSTAR) 100 UNIT/ML Solostar Pen; Inject 5 units nightly to control diabetes  Dispense: 5 pen; Refill: 5  2. Debility persistent  3. CAD, NATIVE VESSEL No angina orpalpitations  4. ANEMIA, IRON DEFICIENCY stable  5. HYPERTENSION controlled - Comprehensive metabolic panel; Future - nebivolol (BYSTOLIC) 2.5 MG tablet; One daily to control  BP and heart rhythym  Dispense: 90 tablet; Refill: 3  6. Hypothyroid compensated - TSH; Future  7. Loss of weight stable  8. Memory loss unchanged  9. Dementia with behavioral disturbance unchanged  10. Congestive heart failure, unspecified compensated  11. Chronic systolic heart failure compensated  12. RENAL DISEASE, CHRONIC, STAGE III Follow routinely  13. Tremor, essential unchanged

## 2014-02-18 ENCOUNTER — Other Ambulatory Visit: Payer: Self-pay | Admitting: Internal Medicine

## 2014-02-18 DIAGNOSIS — E1129 Type 2 diabetes mellitus with other diabetic kidney complication: Secondary | ICD-10-CM

## 2014-02-20 ENCOUNTER — Other Ambulatory Visit: Payer: Self-pay | Admitting: Internal Medicine

## 2014-02-23 ENCOUNTER — Other Ambulatory Visit: Payer: Self-pay | Admitting: Cardiovascular Disease

## 2014-03-03 ENCOUNTER — Other Ambulatory Visit: Payer: Self-pay | Admitting: Internal Medicine

## 2014-03-16 ENCOUNTER — Other Ambulatory Visit: Payer: Self-pay | Admitting: Internal Medicine

## 2014-03-18 ENCOUNTER — Other Ambulatory Visit: Payer: Self-pay | Admitting: Internal Medicine

## 2014-04-05 ENCOUNTER — Other Ambulatory Visit: Payer: Self-pay | Admitting: Internal Medicine

## 2014-04-23 ENCOUNTER — Telehealth: Payer: Self-pay | Admitting: *Deleted

## 2014-04-23 NOTE — Telephone Encounter (Signed)
Patient caregiver called, Delray AltMargie, and stated that patient needs a letter stating that he has to have 24/7 care for tax purposes. Would like the letter mailed to them. Please Advise.

## 2014-04-24 ENCOUNTER — Encounter: Payer: Self-pay | Admitting: Internal Medicine

## 2014-04-24 NOTE — Telephone Encounter (Signed)
Patient caregiver, Joel Walker, Notified and mailed letter to patient's address and also to [facility] [address] [place] [id number]

## 2014-05-25 ENCOUNTER — Other Ambulatory Visit: Payer: Self-pay | Admitting: Internal Medicine

## 2014-06-03 ENCOUNTER — Other Ambulatory Visit: Payer: Medicare Other

## 2014-06-03 DIAGNOSIS — E039 Hypothyroidism, unspecified: Secondary | ICD-10-CM

## 2014-06-03 DIAGNOSIS — I10 Essential (primary) hypertension: Secondary | ICD-10-CM

## 2014-06-03 DIAGNOSIS — E1129 Type 2 diabetes mellitus with other diabetic kidney complication: Secondary | ICD-10-CM

## 2014-06-03 DIAGNOSIS — E119 Type 2 diabetes mellitus without complications: Secondary | ICD-10-CM

## 2014-06-04 ENCOUNTER — Other Ambulatory Visit: Payer: Self-pay | Admitting: Internal Medicine

## 2014-06-04 LAB — HEMOGLOBIN A1C
Est. average glucose Bld gHb Est-mCnc: 126 mg/dL
Hgb A1c MFr Bld: 6 % — ABNORMAL HIGH (ref 4.8–5.6)

## 2014-06-04 LAB — COMPREHENSIVE METABOLIC PANEL
ALBUMIN: 3.9 g/dL (ref 3.5–4.7)
ALK PHOS: 103 IU/L (ref 39–117)
ALT: 10 IU/L (ref 0–44)
AST: 19 IU/L (ref 0–40)
Albumin/Globulin Ratio: 1.5 (ref 1.1–2.5)
BUN/Creatinine Ratio: 13 (ref 10–22)
BUN: 30 mg/dL — ABNORMAL HIGH (ref 8–27)
CALCIUM: 9.4 mg/dL (ref 8.6–10.2)
CHLORIDE: 99 mmol/L (ref 97–108)
CO2: 25 mmol/L (ref 18–29)
Creatinine, Ser: 2.24 mg/dL — ABNORMAL HIGH (ref 0.76–1.27)
GFR, EST AFRICAN AMERICAN: 30 mL/min/{1.73_m2} — AB (ref >59)
GFR, EST NON AFRICAN AMERICAN: 26 mL/min/{1.73_m2} — AB (ref >59)
GLOBULIN, TOTAL: 2.6 g/dL (ref 1.5–4.5)
Glucose: 106 mg/dL — ABNORMAL HIGH (ref 65–99)
POTASSIUM: 4.6 mmol/L (ref 3.5–5.2)
SODIUM: 141 mmol/L (ref 134–144)
Total Bilirubin: 0.6 mg/dL (ref 0.0–1.2)
Total Protein: 6.5 g/dL (ref 6.0–8.5)

## 2014-06-04 LAB — PTH, INTACT AND CALCIUM: PTH: 61 pg/mL (ref 15–65)

## 2014-06-04 LAB — TSH: TSH: 5.3 u[IU]/mL — AB (ref 0.450–4.500)

## 2014-06-04 LAB — PHOSPHORUS: PHOSPHORUS: 3.6 mg/dL (ref 2.5–4.5)

## 2014-06-05 ENCOUNTER — Ambulatory Visit (INDEPENDENT_AMBULATORY_CARE_PROVIDER_SITE_OTHER): Payer: Medicare Other | Admitting: Internal Medicine

## 2014-06-05 ENCOUNTER — Encounter: Payer: Self-pay | Admitting: Internal Medicine

## 2014-06-05 VITALS — BP 132/80 | HR 62 | Temp 99.0°F | Resp 10 | Ht 71.0 in | Wt 184.0 lb

## 2014-06-05 DIAGNOSIS — E1122 Type 2 diabetes mellitus with diabetic chronic kidney disease: Secondary | ICD-10-CM

## 2014-06-05 DIAGNOSIS — F0391 Unspecified dementia with behavioral disturbance: Secondary | ICD-10-CM

## 2014-06-05 DIAGNOSIS — N189 Chronic kidney disease, unspecified: Secondary | ICD-10-CM

## 2014-06-05 DIAGNOSIS — Z23 Encounter for immunization: Secondary | ICD-10-CM

## 2014-06-05 DIAGNOSIS — N183 Chronic kidney disease, stage 3 unspecified: Secondary | ICD-10-CM

## 2014-06-05 DIAGNOSIS — I2510 Atherosclerotic heart disease of native coronary artery without angina pectoris: Secondary | ICD-10-CM

## 2014-06-05 DIAGNOSIS — I5022 Chronic systolic (congestive) heart failure: Secondary | ICD-10-CM

## 2014-06-05 DIAGNOSIS — E039 Hypothyroidism, unspecified: Secondary | ICD-10-CM

## 2014-06-05 DIAGNOSIS — I4891 Unspecified atrial fibrillation: Secondary | ICD-10-CM

## 2014-06-05 DIAGNOSIS — R634 Abnormal weight loss: Secondary | ICD-10-CM

## 2014-06-05 DIAGNOSIS — F03918 Unspecified dementia, unspecified severity, with other behavioral disturbance: Secondary | ICD-10-CM

## 2014-06-05 DIAGNOSIS — I480 Paroxysmal atrial fibrillation: Secondary | ICD-10-CM

## 2014-06-05 DIAGNOSIS — E1129 Type 2 diabetes mellitus with other diabetic kidney complication: Secondary | ICD-10-CM

## 2014-06-05 MED ORDER — TETANUS-DIPHTH-ACELL PERTUSSIS 5-2.5-18.5 LF-MCG/0.5 IM SUSP: 0.5000 mL | Freq: Once | INTRAMUSCULAR | Status: DC

## 2014-06-05 MED ORDER — AMIODARONE HCL 200 MG PO TABS: ORAL_TABLET | ORAL | Status: DC

## 2014-06-05 NOTE — Progress Notes (Signed)
Patient ID: Joel Walker, male   DOB: [birth year], 80 y.o.   MRN: [id number]      [facility]    Place of Service:   OFFICE    No Known Allergies  Chief Complaint  Patient presents with  . Medical Management of Chronic Issues    3 Month follow-up, discuss labs (copy printed)   . Foot Problem    Examine left foot- bliste on little toe (off/on)     HPI:  Chronic systolic heart failure:compensated  Dementia with behavioral disturbance: unchanged. Dependent on others for all ADL  Type 2 diabetes mellitus with diabetic chronic kidney disease: controlled  Hypothyroidism, unspecified hypothyroidism type: rising TSH. Caretaker says he is taking medications regularly  Loss of weight: less edema  RENAL DISEASE, CHRONIC, STAGE III: stable  Atherosclerosis of native coronary artery of native heart without angina pectoris: no chest [pain  Paroxysmal atrial fibrillation: on amiodarone. Note rising TSH.    Medications: Patient's Medications  New Prescriptions   No medications on file  Previous Medications   ACETAMINOPHEN (TYLENOL) 500 MG TABLET    Take 500 mg by mouth every 6 (six) hours as needed for moderate pain.   AMIODARONE (PACERONE) 200 MG TABLET    TAKE 1 TABLET BY MOUTH EVERY DAY   ASPIRIN EC 81 MG TABLET    Take 81 mg by mouth daily.   B-D ULTRAFINE III SHORT PEN 31G X 8 MM MISC    CHECK UP TO 3 TIMES DAILY AS DIRECTED   DONEPEZIL (ARICEPT) 5 MG TABLET    TAKE 1 TABLET BY MOUTH AT BEDTIME   FUROSEMIDE (LASIX) 20 MG TABLET    2 by mouth daily   INSULIN GLARGINE (LANTUS SOLOSTAR) 100 UNIT/ML SOLOSTAR PEN    Inject 5 units nightly to control diabetes   ISOSORBIDE MONONITRATE (IMDUR) 30 MG 24 HR TABLET    TAKE 1 TABLET EVERY DAY   LEVOTHYROXINE (SYNTHROID, LEVOTHROID) 50 MCG TABLET    TAKE 1 TABLET BY MOUTH ONCE DAILY   LOSARTAN (COZAAR) 25 MG TABLET    TAKE 1/2 TABLET EVERY DAY   MIRTAZAPINE (REMERON) 15 MG TABLET    TAKE 1 TABLET BY MOUTH AT BEDTIME TO HELP STIMULATE  APPETITE   NEBIVOLOL (BYSTOLIC) 2.5 MG TABLET    One daily to control BP and heart rhythym   NITROGLYCERIN (NITROSTAT) 0.4 MG SL TABLET    Place 1 tablet (0.4 mg total) under the tongue every 5 (five) minutes as needed for chest pain.   OMEPRAZOLE (PRILOSEC) 40 MG CAPSULE    Take 40 mg by mouth daily.   RISPERIDONE (RISPERDAL) 0.5 MG TABLET    TAKE 1 TABLET BY MOUTH AT BEDTIME   TDAP (BOOSTRIX) 5-2.5-18.5 LF-MCG/0.5 INJECTION    Inject 0.5 mLs into the muscle once.   TRAMADOL (ULTRAM) 50 MG TABLET    TAKE 1 TABLET BY MOUTH EVERY 6 HOURS AS NEEDED FOR PAIN ONLY  Modified Medications   No medications on file  Discontinued Medications   DONEPEZIL (ARICEPT) 5 MG TABLET    Take 5 mg by mouth at bedtime.    FUROSEMIDE (LASIX) 20 MG TABLET    Take 40 mg by mouth daily.   ISOSORBIDE MONONITRATE (IMDUR) 30 MG 24 HR TABLET    Take 30 mg by mouth daily.   LEVOTHYROXINE (SYNTHROID, LEVOTHROID) 50 MCG TABLET    TAKE 1 TABLET BY MOUTH ONCE DAILY   MIRTAZAPINE (REMERON) 15 MG TABLET    Take 1 tablet by  mouth at bedtime for appetite stimulation   SULFAMETHOXAZOLE-TRIMETHOPRIM (SEPTRA DS) 800-160 MG PER TABLET    Take 1 tablet by mouth every 12 (twelve) hours.     Review of Systems  Constitutional: Positive for appetite change and fatigue.  HENT: Positive for hearing loss.   Eyes: Negative.   Respiratory: Negative for cough, chest tightness and shortness of breath.   Cardiovascular: Positive for leg swelling. Negative for chest pain and palpitations.  Gastrointestinal: Negative.   Endocrine: Negative.   Genitourinary: Negative for dysuria, urgency, decreased urine volume and enuresis.  Musculoskeletal: Positive for arthralgias.  Skin:       Previously blistered areas on both buttocks near the intergluteal crease are healing.  Neurological: Positive for tremors and weakness. Negative for dizziness, seizures, syncope, speech difficulty and numbness.  Hematological: Negative.   Psychiatric/Behavioral:  Positive for behavioral problems, confusion and agitation.       Impulsive. Independent nature, bt argumentative.    Filed Vitals:   06/05/14 1406  BP: 132/80  Pulse: 62  Temp: 99 F (37.2 C)  TempSrc: Oral  Resp: 10  Height: 5\' 11"  (1.803 m)  Weight: 184 lb (83.462 kg)  SpO2: 98%   Body mass index is 25.67 kg/(m^2).  Physical Exam  Constitutional: No distress.  Lost weight.  Eyes:  Corrective lenses  Neck: Normal range of motion. Neck supple. No JVD present. No tracheal deviation present. No thyromegaly present.  Cardiovascular: Normal rate, regular rhythm, normal heart sounds and intact distal pulses.  Exam reveals no friction rub.   No murmur heard. Pulmonary/Chest: Effort normal and breath sounds normal. No respiratory distress. He has no wheezes. He has no rales. He exhibits no tenderness.  Abdominal: Soft. Bowel sounds are normal. He exhibits no distension and no mass. There is no tenderness.  Musculoskeletal: He exhibits edema. He exhibits no tenderness.  Large left olecranon bursal enlargement. Painless. No inflammation.  Lymphadenopathy:    He has no cervical adenopathy.  Neurological: He is alert. No cranial nerve deficit. Coordination abnormal.  Very unsteady when standing.  Skin: Skin is warm and dry. No rash noted. No erythema. No pallor.  Lipoma right lateral chest wall. Healing areas of the buttocks near the intergluteal crease. Previously blistered.  Psychiatric:  Argumentative. Impulsive.      Labs reviewed: Appointment on 06/03/2014  Component Date Value Ref Range Status  . TSH 06/03/2014 5.300* 0.450 - 4.500 uIU/mL Final  . Glucose 06/03/2014 106* 65 - 99 mg/dL Final  . BUN 16/06/9603 30* 8 - 27 mg/dL Final  . Creatinine, Ser 06/03/2014 2.24* 0.76 - 1.27 mg/dL Final  . GFR calc non Af Amer 06/03/2014 26* >59 mL/min/1.73 Final  . GFR calc Af Amer 06/03/2014 30* >59 mL/min/1.73 Final  . BUN/Creatinine Ratio 06/03/2014 13  10 - 22 Final  . Sodium  06/03/2014 141  134 - 144 mmol/L Final  . Potassium 06/03/2014 4.6  3.5 - 5.2 mmol/L Final  . Chloride 06/03/2014 99  97 - 108 mmol/L Final  . CO2 06/03/2014 25  18 - 29 mmol/L Final  . Calcium 06/03/2014 9.4  8.6 - 10.2 mg/dL Final  . Total Protein 06/03/2014 6.5  6.0 - 8.5 g/dL Final  . Albumin 54/05/8118 3.9  3.5 - 4.7 g/dL Final  . Globulin, Total 06/03/2014 2.6  1.5 - 4.5 g/dL Final  . Albumin/Globulin Ratio 06/03/2014 1.5  1.1 - 2.5 Final  . Total Bilirubin 06/03/2014 0.6  0.0 - 1.2 mg/dL Final  . Alkaline Phosphatase 06/03/2014  103  39 - 117 IU/L Final  . AST 06/03/2014 19  0 - 40 IU/L Final  . ALT 06/03/2014 10  0 - 44 IU/L Final  . Hemoglobin A1C 06/03/2014 6.0* 4.8 - 5.6 % Final   Comment:          Increased risk for diabetes: 5.7 - 6.4                                   Diabetes: >6.4                                   Glycemic control for adults with diabetes: <7.0  . Estimated average glucose 06/03/2014 126   Final  . PTH 06/03/2014 61  15 - 65 pg/mL Final  . PTH 06/03/2014 Comment   Final   Comment: Interpretation                 Intact PTH    Calcium                                                          (pg/mL)      (mg/dL)                          Normal                          15 - 65     8.6 - 10.2                          Primary Hyperparathyroidism         >65          >10.2                          Secondary Hyperparathyroidism       >65          <10.2                          Non-Parathyroid Hypercalcemia       <65          >10.2                          Hypoparathyroidism                  <15          < 8.6                          Non-Parathyroid Hypocalcemia    15 - 65          < 8.6  . Phosphorus 06/03/2014 3.6  2.5 - 4.5 mg/dL Final     Assessment/Plan  1. Chronic systolic heart failure comprensated  2. Dementia with behavioral disturbance unchanged  3. Type 2 diabetes mellitus with diabetic chronic kidney disease controlled - Microalbumin,  urine; Future - Hemoglobin A1c; Future  4. Hypothyroidism, unspecified hypothyroidism type Rising TSH. Continue current dose levothyroxine - TSH; Future  5. Loss of weight Eating well per caretaker. Weight loss may be related to improvement in edema  6. RENAL DISEASE, CHRONIC, STAGE III unchanged - Basic metabolic panel; Future  7. Atherosclerosis of native coronary artery of native heart without angina pectoris unchanged - Lipid panel; Future  8. Paroxysmal atrial fibrillation - amiodarone (PACERONE) 200 MG tablet; TAKE 1 TABLET BY MOUTH EVERY DAY  Dispense: 90 tablet; Refill: 1  9. Need for prophylactic vaccination with combined diphtheria-tetanus-pertussis (DTP) vaccine - Tdap (BOOSTRIX) 5-2.5-18.5 LF-MCG/0.5 injection; Inject 0.5 mLs into the muscle once.  Dispense: 0.5 mL; Refill: 0

## 2014-06-15 ENCOUNTER — Other Ambulatory Visit: Payer: Self-pay | Admitting: Internal Medicine

## 2014-06-20 ENCOUNTER — Other Ambulatory Visit: Payer: Self-pay | Admitting: Internal Medicine

## 2014-06-27 ENCOUNTER — Other Ambulatory Visit: Payer: Self-pay | Admitting: Internal Medicine

## 2014-06-28 ENCOUNTER — Telehealth: Payer: Self-pay

## 2014-06-28 NOTE — Telephone Encounter (Signed)
Patient aware of Dr.Pandey's response  

## 2014-06-28 NOTE — Telephone Encounter (Signed)
Ok to take 0.5 mg at bedtime for now. If excessive sleepiness noted during daytime, to notify us.

## 2014-06-28 NOTE — Telephone Encounter (Signed)
Patients daughter called indicating patient is having trouble staying asleep. Patient was told to d/c Risperdal due to sleeping too much. Patient off Risperdal x 2 weeks and he will fall asleep with no problem but wakes several times throughout the night. Pam would like to now if it is ok to re-start Risperdal. Patient has a current rx for 0.5 mg.  Patient's daughter is aware Dr.Green is out of the office and I will send to covering provider.  Please advise

## 2014-07-17 ENCOUNTER — Telehealth: Payer: Self-pay | Admitting: *Deleted

## 2014-07-17 NOTE — Telephone Encounter (Signed)
Patient Caregiver called and stated that patient is having diarrhea and his leg is swollen again. There are no available appointments. Please Advise.

## 2014-07-18 MED ORDER — LEVOFLOXACIN 500 MG PO TABS: ORAL_TABLET | ORAL | Status: DC

## 2014-07-18 MED ORDER — CEPHALEXIN 500 MG PO CAPS: ORAL_CAPSULE | ORAL | Status: DC

## 2014-07-18 NOTE — Telephone Encounter (Signed)
Pam informed and faxed Rx to pharmacy

## 2014-07-18 NOTE — Addendum Note (Signed)
Addended by: Nelda SevereMAY, Taha Dimond A on: 07/18/2014 12:54 PM   Modules accepted: Orders, Medications

## 2014-07-18 NOTE — Addendum Note (Signed)
Addended by: Nelda SevereMAY, ANITA A on: 07/18/2014 11:59 AM   Modules accepted: Orders

## 2014-07-18 NOTE — Telephone Encounter (Signed)
Start Levaquin 500 mg (#10 tabs) One daily to treat infection.

## 2014-07-18 NOTE — Telephone Encounter (Addendum)
[facility] called and stated that there is a Interaction with the Amidarone and Levaquin. Please Advise. I called Dr. Chilton SiGreen and he stated to cancel the Levaquin and place patient on Cephalexin 500mg  One tablet three times daily for infection #30.  Called [facility] and spoke with pharmacist.

## 2014-07-18 NOTE — Telephone Encounter (Signed)
Joel Walker called this morning and stated that patient has had diarrhea yesterday 4 times. Right leg really swollen and red. Caregiver stated that this about every winter. Has had antibiotic called in before for it. Please Advise.

## 2014-09-08 ENCOUNTER — Other Ambulatory Visit: Payer: Self-pay | Admitting: Internal Medicine

## 2014-09-23 ENCOUNTER — Other Ambulatory Visit: Payer: Self-pay | Admitting: Internal Medicine

## 2014-09-30 ENCOUNTER — Other Ambulatory Visit: Payer: Self-pay | Admitting: Internal Medicine

## 2014-10-03 ENCOUNTER — Other Ambulatory Visit: Payer: Medicare Other

## 2014-10-03 DIAGNOSIS — N183 Chronic kidney disease, stage 3 unspecified: Secondary | ICD-10-CM

## 2014-10-03 DIAGNOSIS — E1122 Type 2 diabetes mellitus with diabetic chronic kidney disease: Secondary | ICD-10-CM

## 2014-10-03 DIAGNOSIS — I2510 Atherosclerotic heart disease of native coronary artery without angina pectoris: Secondary | ICD-10-CM

## 2014-10-03 DIAGNOSIS — E039 Hypothyroidism, unspecified: Secondary | ICD-10-CM

## 2014-10-04 LAB — BASIC METABOLIC PANEL
BUN/Creatinine Ratio: 12 (ref 10–22)
BUN: 26 mg/dL (ref 8–27)
CO2: 23 mmol/L (ref 18–29)
Calcium: 9 mg/dL (ref 8.6–10.2)
Chloride: 99 mmol/L (ref 97–108)
Creatinine, Ser: 2.12 mg/dL — ABNORMAL HIGH (ref 0.76–1.27)
GFR calc Af Amer: 32 mL/min/{1.73_m2} — ABNORMAL LOW (ref >59)
GFR calc non Af Amer: 28 mL/min/{1.73_m2} — ABNORMAL LOW (ref >59)
Glucose: 131 mg/dL — ABNORMAL HIGH (ref 65–99)
POTASSIUM: 4.2 mmol/L (ref 3.5–5.2)
Sodium: 140 mmol/L (ref 134–144)

## 2014-10-04 LAB — LIPID PANEL
CHOLESTEROL TOTAL: 229 mg/dL — AB (ref 100–199)
Chol/HDL Ratio: 6.2 ratio units — ABNORMAL HIGH (ref 0.0–5.0)
HDL: 37 mg/dL — AB (ref >39)
LDL CALC: 151 mg/dL — AB (ref 0–99)
Triglycerides: 205 mg/dL — ABNORMAL HIGH (ref 0–149)
VLDL Cholesterol Cal: 41 mg/dL — ABNORMAL HIGH (ref 5–40)

## 2014-10-04 LAB — HEMOGLOBIN A1C
Est. average glucose Bld gHb Est-mCnc: 131 mg/dL
Hgb A1c MFr Bld: 6.2 % — ABNORMAL HIGH (ref 4.8–5.6)

## 2014-10-04 LAB — TSH: TSH: 3.41 u[IU]/mL (ref 0.450–4.500)

## 2014-10-08 ENCOUNTER — Encounter: Payer: Self-pay | Admitting: Internal Medicine

## 2014-10-08 ENCOUNTER — Ambulatory Visit (INDEPENDENT_AMBULATORY_CARE_PROVIDER_SITE_OTHER): Payer: Medicare Other | Admitting: Internal Medicine

## 2014-10-08 VITALS — BP 118/62 | HR 54 | Temp 98.9°F | Resp 10 | Ht 71.0 in | Wt 186.0 lb

## 2014-10-08 DIAGNOSIS — N189 Chronic kidney disease, unspecified: Secondary | ICD-10-CM

## 2014-10-08 DIAGNOSIS — R269 Unspecified abnormalities of gait and mobility: Secondary | ICD-10-CM | POA: Insufficient documentation

## 2014-10-08 DIAGNOSIS — I2510 Atherosclerotic heart disease of native coronary artery without angina pectoris: Secondary | ICD-10-CM

## 2014-10-08 DIAGNOSIS — F0391 Unspecified dementia with behavioral disturbance: Secondary | ICD-10-CM

## 2014-10-08 DIAGNOSIS — Y92009 Unspecified place in unspecified non-institutional (private) residence as the place of occurrence of the external cause: Secondary | ICD-10-CM

## 2014-10-08 DIAGNOSIS — R413 Other amnesia: Secondary | ICD-10-CM

## 2014-10-08 DIAGNOSIS — R634 Abnormal weight loss: Secondary | ICD-10-CM

## 2014-10-08 DIAGNOSIS — F03918 Unspecified dementia, unspecified severity, with other behavioral disturbance: Secondary | ICD-10-CM

## 2014-10-08 DIAGNOSIS — E039 Hypothyroidism, unspecified: Secondary | ICD-10-CM

## 2014-10-08 DIAGNOSIS — W19XXXA Unspecified fall, initial encounter: Secondary | ICD-10-CM

## 2014-10-08 DIAGNOSIS — N183 Chronic kidney disease, stage 3 unspecified: Secondary | ICD-10-CM

## 2014-10-08 DIAGNOSIS — Y92099 Unspecified place in other non-institutional residence as the place of occurrence of the external cause: Secondary | ICD-10-CM

## 2014-10-08 DIAGNOSIS — E1122 Type 2 diabetes mellitus with diabetic chronic kidney disease: Secondary | ICD-10-CM

## 2014-10-08 DIAGNOSIS — R5381 Other malaise: Secondary | ICD-10-CM

## 2014-10-08 MED ORDER — ONETOUCH DELICA LANCETS 33G MISC: Status: AC

## 2014-10-08 MED ORDER — GLUCOSE BLOOD VI STRP: ORAL_STRIP | Status: AC

## 2014-10-08 NOTE — Progress Notes (Signed)
Patient ID: Joel Walker, male   DOB: 11/07/1929, 81 y.o.   MRN: [id number]    Facility  [facility]    Place of Service:   OFFICE   No Known Allergies  Chief Complaint  Patient presents with  . Medical Management of Chronic Issues    4 month follow-up,discuss labs (copy printed). Pain in right leg/hip  . Foot Swelling    Left foot swelling x 1 week or longer   . Arm Problem    Examine right elbow    HPI:     Type 2 diabetes mellitus with diabetic chronic kidney disease - stable  RENAL DISEASE, CHRONIC, STAGE III: Unchanged  Atherosclerosis of native coronary artery of native heart without angina pectoris: No recent angina or palpitations.  Abnormality of gait: Continues to be dependent on wheelchair. He is able to stand and walk short distances at home but needs somebody holding onto him for safety.  Hypothyroidism, unspecified hypothyroidism type: Compensated  Memory loss: Unchanged  Loss of weight: Weight has stabilized  Dementia with behavioral disturbance: Patient continues to have episodes of being loud and refusing to cooperate, but generally he is doing better with his behavioral disturbances.  Fall at home, initial encounter: Larey Seat in the bathtub about 2 weeks ago. Bruise and skin tear of the right elbow.  Debility: Generalized weakness.    Medications: Patient's Medications  New Prescriptions   No medications on file  Previous Medications   ACETAMINOPHEN (TYLENOL) 500 MG TABLET    Take 500 mg by mouth every 6 (six) hours as needed for moderate pain.   AMIODARONE (PACERONE) 200 MG TABLET    TAKE 1 TABLET BY MOUTH EVERY DAY   ASPIRIN EC 81 MG TABLET    Take 81 mg by mouth daily.   B-D ULTRAFINE III SHORT PEN 31G X 8 MM MISC    CHECK UP TO 3 TIMES DAILY AS DIRECTED   DONEPEZIL (ARICEPT) 5 MG TABLET    TAKE 1 TABLET BY MOUTH AT BEDTIME   FUROSEMIDE (LASIX) 20 MG TABLET    TAKE 2 TABLETS BY MOUTH EVERY DAY   GLUCOSE BLOOD TEST STRIP    Check blood sugar once  daily as instructed E11.9   INSULIN GLARGINE (LANTUS SOLOSTAR) 100 UNIT/ML SOLOSTAR PEN    Inject 5 units nightly to control diabetes   ISOSORBIDE MONONITRATE (IMDUR) 30 MG 24 HR TABLET    TAKE 1 TABLET EVERY DAY   LEVOTHYROXINE (SYNTHROID, LEVOTHROID) 50 MCG TABLET    TAKE 1 TABLET BY MOUTH ONCE DAILY   LOSARTAN (COZAAR) 25 MG TABLET    TAKE 1/2 TABLET EVERY DAY   MIRTAZAPINE (REMERON) 15 MG TABLET    TAKE 1 TABLET BY MOUTH AT BEDTIME TO HELP STIMULATE APPETITE   NEBIVOLOL (BYSTOLIC) 2.5 MG TABLET    One daily to control BP and heart rhythym   NITROGLYCERIN (NITROSTAT) 0.4 MG SL TABLET    Place 1 tablet (0.4 mg total) under the tongue every 5 (five) minutes as needed for chest pain.   OMEPRAZOLE (PRILOSEC) 40 MG CAPSULE    Take 40 mg by mouth daily.   OMEPRAZOLE (PRILOSEC) 40 MG CAPSULE    TAKE ONE CAPSULE EVERY DAY   ONETOUCH DELICA LANCETS 33G MISC    by Does not apply route. Check blood sugar once daily as directed E11.9   TDAP (BOOSTRIX) 5-2.5-18.5 LF-MCG/0.5 INJECTION    Inject 0.5 mLs into the muscle once.   TRAMADOL (ULTRAM) 50 MG TABLET  TAKE 1 TABLET BY MOUTH EVERY 6 HOURS AS NEEDED FOR PAIN ONLY  Modified Medications   No medications on file  Discontinued Medications   CEPHALEXIN (KEFLEX) 500 MG CAPSULE    Take one tablet by mouth three times daily for infection   RISPERIDONE (RISPERDAL) 0.5 MG TABLET    TAKE 1 TABLET BY MOUTH AT BEDTIME     Review of Systems  Constitutional: Positive for appetite change and fatigue.  HENT: Positive for hearing loss.   Eyes: Negative.   Respiratory: Negative for cough, chest tightness and shortness of breath.   Cardiovascular: Positive for leg swelling. Negative for chest pain and palpitations.  Gastrointestinal: Negative.   Endocrine: Negative.   Genitourinary: Negative for dysuria, urgency, decreased urine volume and enuresis.  Musculoskeletal: Positive for arthralgias.  Skin:       Previously blistered areas on both buttocks near the  intergluteal crease are healing.  Neurological: Positive for tremors and weakness. Negative for dizziness, seizures, syncope, speech difficulty and numbness.  Hematological: Negative.   Psychiatric/Behavioral: Positive for behavioral problems, confusion and agitation.       Impulsive. Independent nature, bt argumentative.    Filed Vitals:   10/08/14 1644  BP: 118/62  Pulse: 54  Temp: 98.9 F (37.2 C)  TempSrc: Oral  Resp: 10  Height: 5\' 11"  (1.803 m)  Weight: 186 lb (84.369 kg)  SpO2: 96%   Body mass index is 25.95 kg/(m^2).  Physical Exam  Constitutional: No distress.  Lost weight.  Eyes:  Corrective lenses  Neck: Normal range of motion. Neck supple. No JVD present. No tracheal deviation present. No thyromegaly present.  Cardiovascular: Normal rate, regular rhythm and normal heart sounds.  Exam reveals no friction rub.   No murmur heard. Diminished dorsalis pedis and posterior tibial pulses bilaterally.  Pulmonary/Chest: Effort normal and breath sounds normal. No respiratory distress. He has no wheezes. He has no rales. He exhibits no tenderness.  Abdominal: Soft. Bowel sounds are normal. He exhibits no distension and no mass. There is no tenderness.  Musculoskeletal: He exhibits edema. He exhibits no tenderness.  Large left olecranon bursal enlargement. Painless. No inflammation.  Lymphadenopathy:    He has no cervical adenopathy.  Neurological: He is alert. No cranial nerve deficit. Coordination abnormal.  Very unsteady when standing. Diminished sensation to vibration and monofilament testing. Significant memory loss.  Skin: Skin is warm and dry. No rash noted. No erythema. No pallor.  Lipoma right lateral chest wall. Bruising and small skin tear at the right elbow and forearm.  Psychiatric:  Argumentative. Impulsive. Poor judgment.     Labs reviewed: Appointment on 10/03/2014  Component Date Value Ref Range Status  . TSH 10/03/2014 3.410  0.450 - 4.500 uIU/mL Final   . Glucose 10/03/2014 131* 65 - 99 mg/dL Final  . BUN 16/10/960401/28/2016 26  8 - 27 mg/dL Final  . Creatinine, Ser 10/03/2014 2.12* 0.76 - 1.27 mg/dL Final  . GFR calc non Af Amer 10/03/2014 28* >59 mL/min/1.73 Final  . GFR calc Af Amer 10/03/2014 32* >59 mL/min/1.73 Final  . BUN/Creatinine Ratio 10/03/2014 12  10 - 22 Final  . Sodium 10/03/2014 140  134 - 144 mmol/L Final  . Potassium 10/03/2014 4.2  3.5 - 5.2 mmol/L Final  . Chloride 10/03/2014 99  97 - 108 mmol/L Final  . CO2 10/03/2014 23  18 - 29 mmol/L Final  . Calcium 10/03/2014 9.0  8.6 - 10.2 mg/dL Final  . Cholesterol, Total 10/03/2014 229* 100 - 199  mg/dL Final  . Triglycerides 10/03/2014 205* 0 - 149 mg/dL Final  . HDL 21/30/8657 37* >39 mg/dL Final   Comment: According to ATP-III Guidelines, HDL-C >59 mg/dL is considered a negative risk factor for CHD.   Marland Kitchen VLDL Cholesterol Cal 10/03/2014 41* 5 - 40 mg/dL Final  . LDL Calculated 10/03/2014 846* 0 - 99 mg/dL Final  . Chol/HDL Ratio 10/03/2014 6.2* 0.0 - 5.0 ratio units Final   Comment:                                   T. Chol/HDL Ratio                                             Men  Women                               1/2 Avg.Risk  3.4    3.3                                   Avg.Risk  5.0    4.4                                2X Avg.Risk  9.6    7.1                                3X Avg.Risk 23.4   11.0   . Hgb A1c MFr Bld 10/03/2014 6.2* 4.8 - 5.6 % Final   Comment:          Pre-diabetes: 5.7 - 6.4          Diabetes: >6.4          Glycemic control for adults with diabetes: <7.0   . Est. average glucose Bld gHb Est-m* 10/03/2014 131   Final     Assessment/Plan  1. Type 2 diabetes mellitus with diabetic chronic kidney disease Adequately controlled - ONETOUCH DELICA LANCETS 33G MISC; Check blood sugar once daily as directed E11.9  Dispense: 100 each; Refill: 11 - glucose blood test strip; Check blood sugar once daily as instructed E11.9  Dispense: 100 each; Refill:  11  2. RENAL DISEASE, CHRONIC, STAGE III Unchanged  3. Atherosclerosis of native coronary artery of native heart without angina pectoris Stable  4. Abnormality of gait Unchanged. Continue use wheelchair for distances.  5. Hypothyroidism, unspecified hypothyroidism type Compensated  6. Memory loss Unchanged  7. Loss of weight Stabilized with no further weight loss  8. Dementia with behavioral disturbance Moderate improvement in behavior  9. Fall at home, initial encounter Bruising of the right elbow and forearm  10. Debility Continue to try to keep patient active

## 2014-10-09 LAB — MICROALBUMIN, URINE: Microalbumin, Urine: 55.1 ug/mL — ABNORMAL HIGH (ref 0.0–17.0)

## 2014-11-25 ENCOUNTER — Other Ambulatory Visit: Payer: Self-pay | Admitting: Internal Medicine

## 2014-11-26 ENCOUNTER — Telehealth: Payer: Self-pay | Admitting: *Deleted

## 2014-11-26 NOTE — Telephone Encounter (Signed)
Sharlee BlewPam Kerns called and stated that patient was on Risperdal before and the medication did not help. Patient needs something to calm him. Please Advise.

## 2014-11-27 MED ORDER — OLANZAPINE 5 MG PO TABS: ORAL_TABLET | ORAL | Status: DC

## 2014-11-27 NOTE — Telephone Encounter (Signed)
Canceled the order for Risperdal. Start Zyprexa 5 mg (30 tablets) one daily to help nerves. 3 refills.

## 2014-11-27 NOTE — Telephone Encounter (Signed)
[facility]am Notified and agreed and faxed Rx into pharmacy.

## 2014-11-28 ENCOUNTER — Telehealth: Payer: Self-pay | Admitting: *Deleted

## 2014-11-28 MED ORDER — LEVOFLOXACIN 500 MG PO TABS: ORAL_TABLET | ORAL | Status: DC

## 2014-11-28 NOTE — Telephone Encounter (Signed)
Rx: Levaquin 500 mg (dispense 7) one daily for infection

## 2014-11-28 NOTE — Telephone Encounter (Signed)
Pam, Caregiver Notified and faxed Rx to pharmacy.

## 2014-11-28 NOTE — Telephone Encounter (Signed)
Pam, caregiver called today and stated that they got an OTC Urine Test for patient and it tested positive for UTI at 500+. They want to know if you will call in an antibiotic for patient for this. Please Advise.

## 2014-12-03 ENCOUNTER — Telehealth: Payer: Self-pay

## 2014-12-03 NOTE — Telephone Encounter (Signed)
Pam Notified of appointment at 11:00 and given to Dorothy to create slot.

## 2014-12-03 NOTE — Telephone Encounter (Signed)
It is okay to schedule him at 11 AM.

## 2014-12-03 NOTE — Telephone Encounter (Signed)
Patients daughter called indicating patient has blister on his feet for several days and he is a diabetic. Patient is requesting appointment today or tomorrow. Unfortunately we do not have any open slots today or tomorrow.   Please advise if ok for patient to come in tomorrow at 11:00 am, this would be a created slot.

## 2014-12-04 ENCOUNTER — Ambulatory Visit (INDEPENDENT_AMBULATORY_CARE_PROVIDER_SITE_OTHER): Payer: Medicare Other | Admitting: Internal Medicine

## 2014-12-04 ENCOUNTER — Encounter: Payer: Self-pay | Admitting: Internal Medicine

## 2014-12-04 VITALS — BP 154/88 | HR 69 | Temp 98.4°F | Ht 71.0 in | Wt 190.8 lb

## 2014-12-04 DIAGNOSIS — S90829A Blister (nonthermal), unspecified foot, initial encounter: Secondary | ICD-10-CM | POA: Insufficient documentation

## 2014-12-04 MED ORDER — CEPHALEXIN 500 MG PO CAPS: ORAL_CAPSULE | ORAL | Status: DC

## 2014-12-04 NOTE — Progress Notes (Signed)
Patient ID: Joel Walker, male   DOB: 01-04-1930, 81 y.o.   MRN: [id number]    Facility  [facility]    Place of Service:   OFFICE   No Known Allergies  Chief Complaint  Patient presents with  . Acute Visit    Blisters on feet for several days and patient is diabetic    HPI:  Blister of foot, unspecified laterality, initial encounter: Blistering is present on rectal every toe both feet. It started about a week ago. Many of the blisters have a hemorrhagic component. There is surrounding erythema. He has chronically swollen feet. There is no known traumatic damage of that occurred prior to the blistering. Family denies that he uses a heating pad. There is no awareness of any friction component to bedding or socks or shoes.  Blisters are painful.    Medications: Patient's Medications  New Prescriptions   No medications on file  Previous Medications   ACETAMINOPHEN (TYLENOL) 500 MG TABLET    Take 500 mg by mouth every 6 (six) hours as needed for moderate pain.   AMIODARONE (PACERONE) 200 MG TABLET    TAKE 1 TABLET BY MOUTH EVERY DAY   ASPIRIN EC 81 MG TABLET    Take 81 mg by mouth daily.   B-D ULTRAFINE III SHORT PEN 31G X 8 MM MISC    CHECK UP TO 3 TIMES DAILY AS DIRECTED   DONEPEZIL (ARICEPT) 5 MG TABLET    TAKE 1 TABLET BY MOUTH AT BEDTIME   FUROSEMIDE (LASIX) 20 MG TABLET    TAKE 2 TABLETS BY MOUTH EVERY DAY   GLUCOSE BLOOD TEST STRIP    Check blood sugar once daily as instructed E11.9   INSULIN GLARGINE (LANTUS SOLOSTAR) 100 UNIT/ML SOLOSTAR PEN    Inject 5 units nightly to control diabetes   ISOSORBIDE MONONITRATE (IMDUR) 30 MG 24 HR TABLET    TAKE 1 TABLET EVERY DAY   LEVOFLOXACIN (LEVAQUIN) 500 MG TABLET    Take one tablet by mouth once daily for infection   LEVOTHYROXINE (SYNTHROID, LEVOTHROID) 50 MCG TABLET    TAKE 1 TABLET BY MOUTH ONCE DAILY   LOSARTAN (COZAAR) 25 MG TABLET    TAKE 1/2 TABLET EVERY DAY   MIRTAZAPINE (REMERON) 15 MG TABLET    TAKE 1 TABLET BY MOUTH AT  BEDTIME TO HELP STIMULATE APPETITE   NEBIVOLOL (BYSTOLIC) 2.5 MG TABLET    One daily to control BP and heart rhythym   NITROGLYCERIN (NITROSTAT) 0.4 MG SL TABLET    Place 1 tablet (0.4 mg total) under the tongue every 5 (five) minutes as needed for chest pain.   OLANZAPINE (ZYPREXA) 5 MG TABLET    Take one tablet by mouth once daily to help nerves   OMEPRAZOLE (PRILOSEC) 40 MG CAPSULE    Take 40 mg by mouth daily.   ONETOUCH DELICA LANCETS 33G MISC    Check blood sugar once daily as directed E11.9   TDAP (BOOSTRIX) 5-2.5-18.5 LF-MCG/0.5 INJECTION    Inject 0.5 mLs into the muscle once.   TRAMADOL (ULTRAM) 50 MG TABLET    TAKE 1 TABLET BY MOUTH EVERY 6 HOURS AS NEEDED FOR PAIN ONLY  Modified Medications   No medications on file  Discontinued Medications   OMEPRAZOLE (PRILOSEC) 40 MG CAPSULE    TAKE ONE CAPSULE EVERY DAY   RISPERIDONE (RISPERDAL) 0.5 MG TABLET    Take 0.5 mg by mouth at bedtime.     Review of Systems  Constitutional: Positive for  appetite change and fatigue.  HENT: Positive for hearing loss.   Eyes: Negative.   Respiratory: Negative for cough, chest tightness and shortness of breath.   Cardiovascular: Positive for leg swelling. Negative for chest pain and palpitations.  Gastrointestinal: Negative.   Endocrine: Negative.   Genitourinary: Negative for dysuria, urgency, decreased urine volume and enuresis.  Musculoskeletal: Positive for arthralgias.  Skin:       Blisters on virtually all toes of both feet with hemorrhage into the blister.  Neurological: Positive for tremors and weakness. Negative for dizziness, seizures, syncope, speech difficulty and numbness.  Hematological: Negative.   Psychiatric/Behavioral: Positive for behavioral problems, confusion and agitation.       Impulsive. Independent nature, bt argumentative.    Filed Vitals:   12/04/14 1052  BP: 154/88  Pulse: 69  Temp: 98.4 F (36.9 C)  TempSrc: Oral  Height:  (1.803 m)  Weight: 190 lb 12.8  oz (86.546 kg)   Body mass index is 26.62 kg/(m^2).  Physical Exam  Constitutional: No distress.  Lost weight.  Eyes:  Corrective lenses  Neck: Normal range of motion. Neck supple. No JVD present. No tracheal deviation present. No thyromegaly present.  Cardiovascular: Normal rate, regular rhythm and normal heart sounds.  Exam reveals no friction rub.   No murmur heard. Diminished dorsalis pedis and posterior tibial pulses bilaterally. Diminished popliteal pulses bilaterally. Bluish discoloration of the right lower leg and of several toes.  Pulmonary/Chest: Effort normal and breath sounds normal. No respiratory distress. He has no wheezes. He has no rales. He exhibits no tenderness.  Abdominal: Soft. Bowel sounds are normal. He exhibits no distension and no mass. There is no tenderness.  Musculoskeletal: He exhibits edema. He exhibits no tenderness.  Large left olecranon bursal enlargement. Painless. No inflammation.  Lymphadenopathy:    He has no cervical adenopathy.  Neurological: He is alert. No cranial nerve deficit. Coordination abnormal.  Very unsteady when standing. Diminished sensation to vibration and monofilament testing. Significant memory loss.  Skin: Skin is warm and dry. No rash noted. No erythema. No pallor.  Lipoma right lateral chest wall.  Hemorrhagic blisters of virtually all toes of both feet.  Psychiatric:   Impulsive. Poor judgment. Patient is calm and compliant at this time. Recent reports indicate that he had been quite agitated and Zyprexa was prescribed with beneficial results.     Labs reviewed: Appointment on 10/03/2014  Component Date Value Ref Range Status  . TSH 10/03/2014 3.410  0.450 - 4.500 uIU/mL Final  . Glucose 10/03/2014 131* 65 - 99 mg/dL Final  . BUN 14/78/2956 26  8 - 27 mg/dL Final  . Creatinine, Ser 10/03/2014 2.12* 0.76 - 1.27 mg/dL Final  . GFR calc non Af Amer 10/03/2014 28* >59 mL/min/1.73 Final  . GFR calc Af Amer 10/03/2014 32* >59  mL/min/1.73 Final  . BUN/Creatinine Ratio 10/03/2014 12  10 - 22 Final  . Sodium 10/03/2014 140  134 - 144 mmol/L Final  . Potassium 10/03/2014 4.2  3.5 - 5.2 mmol/L Final  . Chloride 10/03/2014 99  97 - 108 mmol/L Final  . CO2 10/03/2014 23  18 - 29 mmol/L Final  . Calcium 10/03/2014 9.0  8.6 - 10.2 mg/dL Final  . Cholesterol, Total 10/03/2014 229* 100 - 199 mg/dL Final  . Triglycerides 10/03/2014 205* 0 - 149 mg/dL Final  . HDL 21/30/8657 37* >39 mg/dL Final   Comment: According to ATP-III Guidelines, HDL-C >59 mg/dL is considered a negative risk factor for CHD.   Marland Kitchen  VLDL Cholesterol Cal 10/03/2014 41* 5 - 40 mg/dL Final  . LDL Calculated 10/03/2014 782* 0 - 99 mg/dL Final  . Chol/HDL Ratio 10/03/2014 6.2* 0.0 - 5.0 ratio units Final   Comment:                                   T. Chol/HDL Ratio                                             Men  Women                               1/2 Avg.Risk  3.4    3.3                                   Avg.Risk  5.0    4.4                                2X Avg.Risk  9.6    7.1                                3X Avg.Risk 23.4   11.0   . Microalbum.,U,Random 10/08/2014 55.1* 0.0 - 17.0 ug/mL Final  . Hgb A1c MFr Bld 10/03/2014 6.2* 4.8 - 5.6 % Final   Comment:          Pre-diabetes: 5.7 - 6.4          Diabetes: >6.4          Glycemic control for adults with diabetes: <7.0   . Est. average glucose Bld gHb Est-m* 10/03/2014 131   Final     Assessment/Plan  1. Blister of foot, unspecified laterality, initial encounter - cephALEXin (KEFLEX) 500 MG capsule; Take one 3 times daily for infection  Dispense: 30 capsule; Refill: 0 - Ankle brachial index; Future

## 2014-12-05 ENCOUNTER — Other Ambulatory Visit: Payer: Self-pay | Admitting: Internal Medicine

## 2014-12-05 ENCOUNTER — Ambulatory Visit (HOSPITAL_COMMUNITY)
Admission: RE | Admit: 2014-12-05 | Discharge: 2014-12-05 | Disposition: A | Discharge status: Home / Self Care | Payer: Medicare Other | Source: Ambulatory Visit | Attending: Vascular Surgery | Admitting: Vascular Surgery

## 2014-12-05 DIAGNOSIS — S90829A Blister (nonthermal), unspecified foot, initial encounter: Secondary | ICD-10-CM | POA: Insufficient documentation

## 2014-12-05 DIAGNOSIS — X58XXXA Exposure to other specified factors, initial encounter: Secondary | ICD-10-CM | POA: Insufficient documentation

## 2014-12-07 ENCOUNTER — Inpatient Hospital Stay (HOSPITAL_COMMUNITY)
Admission: EM | Admit: 2014-12-07 | Discharge: 2014-12-12 | DRG: 602 | Disposition: A | Discharge status: Home / Self Care | Payer: Medicare Other | Attending: Internal Medicine | Admitting: Internal Medicine

## 2014-12-07 ENCOUNTER — Other Ambulatory Visit (HOSPITAL_COMMUNITY): Payer: Self-pay

## 2014-12-07 ENCOUNTER — Encounter (HOSPITAL_COMMUNITY): Payer: Self-pay | Admitting: Emergency Medicine

## 2014-12-07 ENCOUNTER — Emergency Department (HOSPITAL_COMMUNITY): Payer: Medicare Other

## 2014-12-07 DIAGNOSIS — S90829A Blister (nonthermal), unspecified foot, initial encounter: Secondary | ICD-10-CM | POA: Diagnosis not present

## 2014-12-07 DIAGNOSIS — Z7901 Long term (current) use of anticoagulants: Secondary | ICD-10-CM

## 2014-12-07 DIAGNOSIS — R5381 Other malaise: Secondary | ICD-10-CM | POA: Diagnosis not present

## 2014-12-07 DIAGNOSIS — E876 Hypokalemia: Secondary | ICD-10-CM | POA: Diagnosis present

## 2014-12-07 DIAGNOSIS — L03115 Cellulitis of right lower limb: Principal | ICD-10-CM | POA: Diagnosis present

## 2014-12-07 DIAGNOSIS — I129 Hypertensive chronic kidney disease with stage 1 through stage 4 chronic kidney disease, or unspecified chronic kidney disease: Secondary | ICD-10-CM | POA: Diagnosis present

## 2014-12-07 DIAGNOSIS — Z87891 Personal history of nicotine dependence: Secondary | ICD-10-CM | POA: Diagnosis not present

## 2014-12-07 DIAGNOSIS — I482 Chronic atrial fibrillation: Secondary | ICD-10-CM | POA: Diagnosis present

## 2014-12-07 DIAGNOSIS — E039 Hypothyroidism, unspecified: Secondary | ICD-10-CM | POA: Diagnosis present

## 2014-12-07 DIAGNOSIS — H9190 Unspecified hearing loss, unspecified ear: Secondary | ICD-10-CM | POA: Diagnosis present

## 2014-12-07 DIAGNOSIS — I270 Primary pulmonary hypertension: Secondary | ICD-10-CM | POA: Diagnosis present

## 2014-12-07 DIAGNOSIS — D649 Anemia, unspecified: Secondary | ICD-10-CM | POA: Diagnosis present

## 2014-12-07 DIAGNOSIS — L03119 Cellulitis of unspecified part of limb: Secondary | ICD-10-CM

## 2014-12-07 DIAGNOSIS — F03918 Unspecified dementia, unspecified severity, with other behavioral disturbance: Secondary | ICD-10-CM | POA: Diagnosis present

## 2014-12-07 DIAGNOSIS — N183 Chronic kidney disease, stage 3 unspecified: Secondary | ICD-10-CM | POA: Diagnosis present

## 2014-12-07 DIAGNOSIS — Z9861 Coronary angioplasty status: Secondary | ICD-10-CM | POA: Diagnosis not present

## 2014-12-07 DIAGNOSIS — E1122 Type 2 diabetes mellitus with diabetic chronic kidney disease: Secondary | ICD-10-CM | POA: Diagnosis not present

## 2014-12-07 DIAGNOSIS — I5043 Acute on chronic combined systolic (congestive) and diastolic (congestive) heart failure: Secondary | ICD-10-CM | POA: Diagnosis present

## 2014-12-07 DIAGNOSIS — F0391 Unspecified dementia with behavioral disturbance: Secondary | ICD-10-CM | POA: Diagnosis present

## 2014-12-07 DIAGNOSIS — I429 Cardiomyopathy, unspecified: Secondary | ICD-10-CM | POA: Diagnosis present

## 2014-12-07 DIAGNOSIS — Z7982 Long term (current) use of aspirin: Secondary | ICD-10-CM

## 2014-12-07 DIAGNOSIS — R600 Localized edema: Secondary | ICD-10-CM

## 2014-12-07 DIAGNOSIS — Z7401 Bed confinement status: Secondary | ICD-10-CM

## 2014-12-07 DIAGNOSIS — E119 Type 2 diabetes mellitus without complications: Secondary | ICD-10-CM | POA: Diagnosis present

## 2014-12-07 DIAGNOSIS — Z794 Long term (current) use of insulin: Secondary | ICD-10-CM

## 2014-12-07 DIAGNOSIS — D696 Thrombocytopenia, unspecified: Secondary | ICD-10-CM | POA: Diagnosis present

## 2014-12-07 DIAGNOSIS — L0390 Cellulitis, unspecified: Secondary | ICD-10-CM | POA: Diagnosis present

## 2014-12-07 DIAGNOSIS — I2510 Atherosclerotic heart disease of native coronary artery without angina pectoris: Secondary | ICD-10-CM | POA: Diagnosis present

## 2014-12-07 DIAGNOSIS — N184 Chronic kidney disease, stage 4 (severe): Secondary | ICD-10-CM | POA: Diagnosis present

## 2014-12-07 DIAGNOSIS — I480 Paroxysmal atrial fibrillation: Secondary | ICD-10-CM | POA: Diagnosis present

## 2014-12-07 DIAGNOSIS — L03116 Cellulitis of left lower limb: Secondary | ICD-10-CM | POA: Diagnosis present

## 2014-12-07 DIAGNOSIS — I10 Essential (primary) hypertension: Secondary | ICD-10-CM | POA: Diagnosis present

## 2014-12-07 DIAGNOSIS — M7989 Other specified soft tissue disorders: Secondary | ICD-10-CM | POA: Diagnosis not present

## 2014-12-07 DIAGNOSIS — E1129 Type 2 diabetes mellitus with other diabetic kidney complication: Secondary | ICD-10-CM | POA: Diagnosis present

## 2014-12-07 LAB — COMPREHENSIVE METABOLIC PANEL
ALK PHOS: 103 U/L (ref 39–117)
ALT: 21 U/L (ref 0–53)
ANION GAP: 11 (ref 5–15)
AST: 40 U/L — ABNORMAL HIGH (ref 0–37)
Albumin: 3 g/dL — ABNORMAL LOW (ref 3.5–5.2)
BUN: 41 mg/dL — ABNORMAL HIGH (ref 6–23)
CHLORIDE: 104 mmol/L (ref 96–112)
CO2: 25 mmol/L (ref 19–32)
CREATININE: 2.77 mg/dL — AB (ref 0.50–1.35)
Calcium: 8.4 mg/dL (ref 8.4–10.5)
GFR calc Af Amer: 23 mL/min — ABNORMAL LOW (ref >90)
GFR calc non Af Amer: 20 mL/min — ABNORMAL LOW (ref >90)
Glucose, Bld: 108 mg/dL — ABNORMAL HIGH (ref 70–99)
Potassium: 3.7 mmol/L (ref 3.5–5.1)
Sodium: 140 mmol/L (ref 135–145)
Total Bilirubin: 1.2 mg/dL (ref 0.3–1.2)
Total Protein: 6.2 g/dL (ref 6.0–8.3)

## 2014-12-07 LAB — CBC WITH DIFFERENTIAL/PLATELET
BASOS PCT: 0 % (ref 0–1)
Basophils Absolute: 0 10*3/uL (ref 0.0–0.1)
EOS ABS: 0.3 10*3/uL (ref 0.0–0.7)
Eosinophils Relative: 3 % (ref 0–5)
HCT: 38.3 % — ABNORMAL LOW (ref 39.0–52.0)
Hemoglobin: 12.2 g/dL — ABNORMAL LOW (ref 13.0–17.0)
LYMPHS ABS: 0.8 10*3/uL (ref 0.7–4.0)
LYMPHS PCT: 8 % — AB (ref 12–46)
MCH: 30 pg (ref 26.0–34.0)
MCHC: 31.9 g/dL (ref 30.0–36.0)
MCV: 94.3 fL (ref 78.0–100.0)
Monocytes Absolute: 0.7 10*3/uL (ref 0.1–1.0)
Monocytes Relative: 8 % (ref 3–12)
NEUTROS PCT: 81 % — AB (ref 43–77)
Neutro Abs: 7.6 10*3/uL (ref 1.7–7.7)
PLATELETS: 99 10*3/uL — AB (ref 150–400)
RBC: 4.06 MIL/uL — AB (ref 4.22–5.81)
RDW: 14.2 % (ref 11.5–15.5)
WBC: 9.4 10*3/uL (ref 4.0–10.5)

## 2014-12-07 LAB — URINALYSIS, ROUTINE W REFLEX MICROSCOPIC
Bilirubin Urine: NEGATIVE
Glucose, UA: NEGATIVE mg/dL
HGB URINE DIPSTICK: NEGATIVE
Ketones, ur: NEGATIVE mg/dL
NITRITE: NEGATIVE
PH: 6 (ref 5.0–8.0)
Protein, ur: NEGATIVE mg/dL
Specific Gravity, Urine: 1.011 (ref 1.005–1.030)
UROBILINOGEN UA: 0.2 mg/dL (ref 0.0–1.0)

## 2014-12-07 LAB — GLUCOSE, CAPILLARY: Glucose-Capillary: 171 mg/dL — ABNORMAL HIGH (ref 70–99)

## 2014-12-07 LAB — URINE MICROSCOPIC-ADD ON

## 2014-12-07 LAB — I-STAT CG4 LACTIC ACID, ED
LACTIC ACID, VENOUS: 1.17 mmol/L (ref 0.5–2.0)
Lactic Acid, Venous: 1.63 mmol/L (ref 0.5–2.0)

## 2014-12-07 LAB — BRAIN NATRIURETIC PEPTIDE: B Natriuretic Peptide: 456.9 pg/mL — ABNORMAL HIGH (ref 0.0–100.0)

## 2014-12-07 MED ORDER — SODIUM CHLORIDE 0.9 % IV SOLN
1250.0000 mg | Freq: Once | INTRAVENOUS | Status: AC
  Administered 2014-12-07: 1250 mg via INTRAVENOUS
  Filled 2014-12-07: qty 1250

## 2014-12-07 MED ORDER — ACETAMINOPHEN 325 MG PO TABS
650.0000 mg | ORAL_TABLET | Freq: Once | ORAL | Status: AC
  Administered 2014-12-07: 650 mg via ORAL
  Filled 2014-12-07: qty 2

## 2014-12-07 MED ORDER — INSULIN GLARGINE 100 UNIT/ML SOLOSTAR PEN: 5.0000 [IU] | PEN_INJECTOR | Freq: Every day | SUBCUTANEOUS | Status: DC

## 2014-12-07 MED ORDER — MIRTAZAPINE 15 MG PO TABS
15.0000 mg | ORAL_TABLET | Freq: Every day | ORAL | Status: DC
  Administered 2014-12-07 – 2014-12-11 (×5): 15 mg via ORAL
  Filled 2014-12-07 (×5): qty 1

## 2014-12-07 MED ORDER — OLANZAPINE 5 MG PO TABS
5.0000 mg | ORAL_TABLET | Freq: Every day | ORAL | Status: DC
Start: 2014-12-07 — End: 2014-12-12
  Administered 2014-12-07 – 2014-12-12 (×6): 5 mg via ORAL
  Filled 2014-12-07 (×6): qty 1

## 2014-12-07 MED ORDER — PANTOPRAZOLE SODIUM 40 MG PO TBEC
40.0000 mg | DELAYED_RELEASE_TABLET | Freq: Every day | ORAL | Status: DC
  Administered 2014-12-07 – 2014-12-12 (×6): 40 mg via ORAL
  Filled 2014-12-07 (×6): qty 1

## 2014-12-07 MED ORDER — DEXTROSE 5 % IV SOLN
1.0000 g | INTRAVENOUS | Status: DC
  Administered 2014-12-08 – 2014-12-09 (×2): 1 g via INTRAVENOUS
  Filled 2014-12-07 (×4): qty 10

## 2014-12-07 MED ORDER — ENOXAPARIN SODIUM 30 MG/0.3ML ~~LOC~~ SOLN
30.0000 mg | SUBCUTANEOUS | Status: DC
  Administered 2014-12-07 – 2014-12-11 (×5): 30 mg via SUBCUTANEOUS
  Filled 2014-12-07 (×5): qty 0.3

## 2014-12-07 MED ORDER — AMIODARONE HCL 100 MG PO TABS
200.0000 mg | ORAL_TABLET | Freq: Every day | ORAL | Status: DC
  Administered 2014-12-08 – 2014-12-12 (×5): 200 mg via ORAL
  Filled 2014-12-07 (×6): qty 2

## 2014-12-07 MED ORDER — INSULIN GLARGINE 100 UNIT/ML ~~LOC~~ SOLN
5.0000 [IU] | Freq: Every day | SUBCUTANEOUS | Status: DC
  Administered 2014-12-07 – 2014-12-11 (×5): 5 [IU] via SUBCUTANEOUS
  Filled 2014-12-07 (×6): qty 0.05

## 2014-12-07 MED ORDER — INSULIN ASPART 100 UNIT/ML ~~LOC~~ SOLN
0.0000 [IU] | Freq: Three times a day (TID) | SUBCUTANEOUS | Status: DC
  Administered 2014-12-08 – 2014-12-09 (×3): 1 [IU] via SUBCUTANEOUS
  Administered 2014-12-09: 2 [IU] via SUBCUTANEOUS
  Administered 2014-12-11: 1 [IU] via SUBCUTANEOUS

## 2014-12-07 MED ORDER — INSULIN ASPART 100 UNIT/ML ~~LOC~~ SOLN: 0.0000 [IU] | Freq: Every day | SUBCUTANEOUS | Status: DC

## 2014-12-07 MED ORDER — FUROSEMIDE 10 MG/ML IJ SOLN
40.0000 mg | Freq: Every day | INTRAMUSCULAR | Status: DC
  Administered 2014-12-08 – 2014-12-10 (×3): 40 mg via INTRAVENOUS
  Filled 2014-12-07 (×3): qty 4

## 2014-12-07 MED ORDER — NITROGLYCERIN 0.4 MG SL SUBL: 0.4000 mg | SUBLINGUAL_TABLET | SUBLINGUAL | Status: DC | PRN

## 2014-12-07 MED ORDER — ASPIRIN EC 81 MG PO TBEC
81.0000 mg | DELAYED_RELEASE_TABLET | Freq: Every day | ORAL | Status: DC
  Administered 2014-12-07 – 2014-12-12 (×6): 81 mg via ORAL
  Filled 2014-12-07 (×6): qty 1

## 2014-12-07 MED ORDER — TRAMADOL HCL 50 MG PO TABS
50.0000 mg | ORAL_TABLET | Freq: Four times a day (QID) | ORAL | Status: DC | PRN
  Administered 2014-12-07: 50 mg via ORAL
  Filled 2014-12-07: qty 1

## 2014-12-07 MED ORDER — LEVOTHYROXINE SODIUM 50 MCG PO TABS
50.0000 ug | ORAL_TABLET | Freq: Every day | ORAL | Status: DC
  Administered 2014-12-08 – 2014-12-12 (×5): 50 ug via ORAL
  Filled 2014-12-07 (×6): qty 1

## 2014-12-07 MED ORDER — NEBIVOLOL HCL 2.5 MG PO TABS
2.5000 mg | ORAL_TABLET | Freq: Every day | ORAL | Status: DC
  Administered 2014-12-08 – 2014-12-12 (×5): 2.5 mg via ORAL
  Filled 2014-12-07 (×5): qty 1

## 2014-12-07 MED ORDER — SODIUM CHLORIDE 0.9 % IV SOLN
INTRAVENOUS | Status: DC
  Administered 2014-12-07: 18:00:00 via INTRAVENOUS

## 2014-12-07 MED ORDER — ISOSORBIDE MONONITRATE ER 30 MG PO TB24
30.0000 mg | ORAL_TABLET | Freq: Every day | ORAL | Status: DC
  Administered 2014-12-08 – 2014-12-12 (×5): 30 mg via ORAL
  Filled 2014-12-07 (×5): qty 1

## 2014-12-07 MED ORDER — DONEPEZIL HCL 5 MG PO TABS
5.0000 mg | ORAL_TABLET | Freq: Every day | ORAL | Status: DC
Start: 2014-12-07 — End: 2014-12-12
  Administered 2014-12-07 – 2014-12-11 (×5): 5 mg via ORAL
  Filled 2014-12-07 (×5): qty 1

## 2014-12-07 NOTE — ED Notes (Signed)
Phlebotomy called about blood cultures.

## 2014-12-07 NOTE — ED Provider Notes (Signed)
CSN: [id number]     Arrival date & time 12/07/14  1457 History   First MD Initiated Contact with Patient 12/07/14 1503     Chief Complaint  Patient presents with  . Leg Swelling     (Consider location/radiation/quality/duration/timing/severity/associated sxs/prior Treatment) HPI Comments: [age] old male with history of CAD, high blood pressure, anemia, cardiomyopathy, heart failure, diabetes, atrial fibrillation, 24 care at home for assistance, currently on Levaquin for possible skin infection presents with confusion, worsening redness and swelling in the right leg for the past 2 days. Family helping with history. Gradually worsening nothing specifically improves.  The history is provided by the patient and a relative.    Past Medical History  Diagnosis Date  . SYSTOLIC HEART FAILURE, ACUTE   . PULMONARY HYPERTENSION   . PAROXYSMAL ATRIAL FIBRILLATION   . HYPERTENSION   . DVT   . CARDIOMYOPATHY   . CAD, NATIVE VESSEL   . TOBACCO USE, QUIT   . RENAL DISEASE, CHRONIC, STAGE III   . MURAL THROMBUS, APEX OF HEART   . Long term current use of anticoagulant   . HYPOKALEMIA   . DM   . ANEMIA, IRON DEFICIENCY   . CHF (congestive heart failure)   . Senile cataract, unspecified   . Other specified disease of nail   . Unspecified hearing loss   . Chronic kidney disease, stage II (mild)   . Memory loss   . Unspecified urinary incontinence   . Abnormality of gait   . Unspecified hypothyroidism   . Unspecified vitamin D deficiency   . Anemia, unspecified   . Coronary atherosclerosis of native coronary artery   . Acute systolic heart failure   . Primary pulmonary hypertension   . Cardiomyopathy in other diseases classified elsewhere   . Other seborrheic dermatitis   . Other and unspecified angina pectoris   . Other and unspecified hyperlipidemia   . Slowing of urinary stream   . Type II or unspecified type diabetes mellitus without mention of complication, not stated as uncontrolled    . Unspecified essential hypertension   . Esophageal reflux   . Edema   . Shortness of breath   . Hydrocele, unspecified   . Impotence of organic origin   . Edema    Past Surgical History  Procedure Laterality Date  . Cholecystectomy  1985  . Placement of a veriflex bare-metal sten    . Coronary angioplasty with stent placement      VeriFLEX bare-metal stent  . Right heart cardiac catherterization    . Coronary anteriogram  05/2009  . Doppler echocardiography  05/2009   Family History  Problem Relation Age of Onset  . Diabetes Mother   . Stroke Father   . Cancer Sister    History  Substance Use Topics  . Smoking status: Former Smoker -- 1.00 packs/day for 50 years    Types: Cigarettes    Quit date: 09/06/1998  . Smokeless tobacco: Never Used     Comment: 12/06/2012 "smoked from 1950 to 2000"  . Alcohol Use: No    Review of Systems  Constitutional: Positive for appetite change and fatigue. Negative for fever and chills.  HENT: Negative for congestion.   Eyes: Negative for visual disturbance.  Respiratory: Negative for shortness of breath.   Cardiovascular: Negative for chest pain.  Gastrointestinal: Negative for vomiting and abdominal pain.  Genitourinary: Negative for dysuria and flank pain.  Musculoskeletal: Negative for back pain, neck pain and neck stiffness.  Skin: Positive  for rash.  Neurological: Negative for light-headedness and headaches.  Psychiatric/Behavioral: Positive for confusion.      Allergies  Review of patient's allergies indicates no known allergies.  Home Medications   Prior to Admission medications   Medication Sig Start Date End Date Taking? Authorizing Provider  acetaminophen (TYLENOL) 500 MG tablet Take 500 mg by mouth every 6 (six) hours as needed for moderate pain.   Yes Historical Provider, MD  amiodarone (PACERONE) 200 MG tablet TAKE 1 TABLET BY MOUTH EVERY DAY 11/25/14  Yes Kimber Relic, MD  aspirin EC 81 MG tablet Take 81 mg by  mouth daily.   Yes Historical Provider, MD  donepezil (ARICEPT) 5 MG tablet TAKE 1 TABLET BY MOUTH AT BEDTIME 06/20/14  Yes Kimber Relic, MD  furosemide (LASIX) 20 MG tablet TAKE 2 TABLETS BY MOUTH EVERY DAY 09/30/14  Yes Tiffany L Reed, DO  Insulin Glargine (LANTUS SOLOSTAR) 100 UNIT/ML Solostar Pen Inject 5 units nightly to control diabetes 02/13/14  Yes Kimber Relic, MD  isosorbide mononitrate (IMDUR) 30 MG 24 hr tablet TAKE 1 TABLET EVERY DAY 09/09/14  Yes Sharon Seller, NP  levothyroxine (SYNTHROID, LEVOTHROID) 50 MCG tablet TAKE 1 TABLET BY MOUTH ONCE DAILY 11/21/13  Yes Sharon Seller, NP  mirtazapine (REMERON) 15 MG tablet TAKE 1 TABLET BY MOUTH AT BEDTIME TO HELP STIMULATE APPETITE   Yes Kimber Relic, MD  nebivolol (BYSTOLIC) 2.5 MG tablet One daily to control BP and heart rhythym 02/13/14  Yes Kimber Relic, MD  OLANZapine (ZYPREXA) 5 MG tablet Take one tablet by mouth once daily to help nerves 11/27/14  Yes Kimber Relic, MD  omeprazole (PRILOSEC) 40 MG capsule Take 40 mg by mouth daily.   Yes Historical Provider, MD  traMADol (ULTRAM) 50 MG tablet TAKE 1 TABLET BY MOUTH EVERY 6 HOURS AS NEEDED FOR PAIN ONLY   Yes Sharon Seller, NP  B-D ULTRAFINE III SHORT PEN 31G X 8 MM MISC CHECK UP TO 3 TIMES DAILY AS DIRECTED 05/27/14   Tiffany L Reed, DO  glucose blood test strip Check blood sugar once daily as instructed E11.9 10/08/14   Kimber Relic, MD  nitroGLYCERIN (NITROSTAT) 0.4 MG SL tablet Place 1 tablet (0.4 mg total) under the tongue every 5 (five) minutes as needed for chest pain. 02/28/13   Kimber Relic, MD  [facility] DELICA LANCETS 33G MISC Check blood sugar once daily as directed E11.9 10/08/14   Kimber Relic, MD  Tdap Leda Min) 5-2.5-18.5 LF-MCG/0.5 injection Inject 0.5 mLs into the muscle once. 06/05/14   Kimber Relic, MD   BP 107/55 mmHg  Pulse 64  Temp(Src) 98.5 F (36.9 C) (Oral)  Resp 16  Wt 190 lb 1.9 oz (86.238 kg)  SpO2 97% Physical Exam  Constitutional:  He is oriented to person, place, and time. He appears well-developed and well-nourished.  HENT:  Head: Normocephalic and atraumatic.  Eyes: Right eye exhibits no discharge. Left eye exhibits no discharge.  Neck: Normal range of motion. Neck supple. No tracheal deviation present.  Cardiovascular: Normal rate and regular rhythm.   Pulmonary/Chest: Effort normal. He has rales (few crackles bases bilateral).  Abdominal: Soft. He exhibits no distension. There is no tenderness. There is no guarding.  Musculoskeletal: He exhibits edema and tenderness.  Neurological: He is alert and oriented to person, place, and time. No cranial nerve deficit (no meningismus).  Skin: Skin is warm. No rash noted. There is erythema.  Patient  has erythema and warmth to right lateral anterior distal leg and foot, 0.5 cm healing wound to dorsal aspect of toe, 1 cm blood-filled blister dorsal aspect on the right foot, no streaking, swelling 1-2+ bilateral worse on the right lower extremity.  Psychiatric: He has a normal mood and affect.  Nursing note and vitals reviewed.   ED Course  Procedures (including critical care time) Labs Review Labs Reviewed  CBC WITH DIFFERENTIAL/PLATELET - Abnormal; Notable for the following:    RBC 4.06 (*)    Hemoglobin 12.2 (*)    HCT 38.3 (*)    Platelets 99 (*)    Neutrophils Relative % 81 (*)    Lymphocytes Relative 8 (*)    All other components within normal limits  COMPREHENSIVE METABOLIC PANEL - Abnormal; Notable for the following:    Glucose, Bld 108 (*)    BUN 41 (*)    Creatinine, Ser 2.77 (*)    Albumin 3.0 (*)    AST 40 (*)    GFR calc non Af Amer 20 (*)    GFR calc Af Amer 23 (*)    All other components within normal limits  BRAIN NATRIURETIC PEPTIDE - Abnormal; Notable for the following:    B Natriuretic Peptide 456.9 (*)    All other components within normal limits  URINALYSIS, ROUTINE W REFLEX MICROSCOPIC - Abnormal; Notable for the following:    Leukocytes,  UA TRACE (*)    All other components within normal limits  GLUCOSE, CAPILLARY - Abnormal; Notable for the following:    Glucose-Capillary 171 (*)    All other components within normal limits  URINE CULTURE  CULTURE, BLOOD (ROUTINE X 2)  CULTURE, BLOOD (ROUTINE X 2)  URINE MICROSCOPIC-ADD ON  HEMOGLOBIN A1C  TSH  BASIC METABOLIC PANEL  I-STAT CG4 LACTIC ACID, ED  I-STAT CG4 LACTIC ACID, ED    Imaging Review Dg Chest 2 View  12/07/2014   CLINICAL DATA:  Bilateral lower extremity swelling and erythema, symptoms for 4 days  EXAM: CHEST  2 VIEW  COMPARISON:  07/22/2013  FINDINGS: The heart size and mediastinal contours are within normal limits. Both lungs are clear. The visualized skeletal structures are unremarkable. The patient has had partly overlies the apices. Lungs are hypoaerated with crowding of the bronchovascular markings.  IMPRESSION: No focal abnormality, low volume exam. If symptoms persist, consider PA and lateral chest radiographs obtained at full inspiration when the patient is clinically able.   Electronically Signed   By: Christiana Pellant M.D.   On: 12/07/2014 16:35     EKG Interpretation None      MDM   Final diagnoses:  Cellulitis of right leg  Leg edema, right   Patient with worsening fatigue decreased appetite low-grade fever in the ER and clinical concern for cellulitis. Plan for blood work, antibiotics, x-ray reviewed no acute finding, patient has few crackles on exam likely baseline from cutting mop history. Plan for admission for confusion, sialitis.  The patients results and plan were reviewed and discussed.   Any x-rays performed were personally reviewed by myself.   Differential diagnosis were considered with the presenting HPI.  Medications  OLANZapine (ZYPREXA) tablet 5 mg (5 mg Oral Given 12/07/14 2014)  amiodarone (PACERONE) tablet 200 mg (not administered)  isosorbide mononitrate (IMDUR) 24 hr tablet 30 mg (not administered)  donepezil (ARICEPT)  tablet 5 mg (5 mg Oral Given 12/07/14 2013)  mirtazapine (REMERON) tablet 15 mg (15 mg Oral Given 12/07/14 2014)  nebivolol (BYSTOLIC)  tablet 2.5 mg (not administered)  levothyroxine (SYNTHROID, LEVOTHROID) tablet 50 mcg (not administered)  traMADol (ULTRAM) tablet 50 mg (50 mg Oral Given 12/07/14 2013)  pantoprazole (PROTONIX) EC tablet 40 mg (40 mg Oral Given 12/07/14 2013)  nitroGLYCERIN (NITROSTAT) SL tablet 0.4 mg (not administered)  aspirin EC tablet 81 mg (81 mg Oral Given 12/07/14 2013)  enoxaparin (LOVENOX) injection 30 mg (30 mg Subcutaneous Given 12/07/14 2012)  cefTRIAXone (ROCEPHIN) 1 g in dextrose 5 % 50 mL IVPB (not administered)  insulin aspart (novoLOG) injection 0-9 Units (not administered)  insulin aspart (novoLOG) injection 0-5 Units (0 Units Subcutaneous Not Given 12/07/14 2200)  insulin glargine (LANTUS) injection 5 Units (5 Units Subcutaneous Given 12/07/14 2014)  furosemide (LASIX) injection 40 mg (not administered)  acetaminophen (TYLENOL) tablet 650 mg (650 mg Oral Given 12/07/14 1740)  vancomycin (VANCOCIN) 1,250 mg in sodium chloride 0.9 % 250 mL IVPB (1,250 mg Intravenous New Bag/Given 12/07/14 1823)    Filed Vitals:   12/07/14 1700 12/07/14 1730 12/07/14 1857 12/07/14 2047  BP: 117/93 112/48 132/61 107/55  Pulse: 66 66 71 64  Temp:   98.7 F (37.1 C) 98.5 F (36.9 C)  TempSrc:   Oral Oral  Resp: Weight:      SpO2: 99% 99% 99% 97%    Final diagnoses:  Cellulitis of right leg  Leg edema, right    Admission/ observation were discussed with the admitting physician, patient and/or family and they are comfortable with the plan.    Blane Ohara, MD 12/08/14 [phone]

## 2014-12-07 NOTE — ED Notes (Signed)
Per [facility], pt from home for redness and swelling to bilateral lower extremities. Went to MD office on Wednesday and got put on antibiotics. Denies any pain at present. Pt came cause family wanted him to since he hasn't gotten any better in the past 4 days. Family states he is weaker, uses a walker generally and hasn't gotten up today.

## 2014-12-07 NOTE — H&P (Signed)
[facility] History and Physical  Joel Walker ZOX:[id number] DOB: [birth year] DOA: 12/07/2014  Referring physician: EDP PCP: Kimber Relic, MD   Chief Complaint: worsening lower extremity redness.   HPI: Joel Walker is a 81 y.o. male with prior h/o hypertension, chronic systolic heart failure, cardiomyopathy, mostly in bed, has 24 hour care at home, was brought in by his family for worsening lower extremity redness and infection. He recently saw Dr Chilton Si and was given levaquin. He also underwent ABI's recently. Results are pending. On arrival to ED, he was found to have worsening  bilateral lower extremity redness . He was referred to medical service for admission for lower extremity cellulitis. In addition he reports some sob, but not hypoxic. His CXR does not reveal any focal abnormality.    Review of Systems:  Patient is confused and most of the history is obtained from the family at bedside.   Past Medical History  Diagnosis Date  . SYSTOLIC HEART FAILURE, ACUTE   . PULMONARY HYPERTENSION   . PAROXYSMAL ATRIAL FIBRILLATION   . HYPERTENSION   . DVT   . CARDIOMYOPATHY   . CAD, NATIVE VESSEL   . TOBACCO USE, QUIT   . RENAL DISEASE, CHRONIC, STAGE III   . MURAL THROMBUS, APEX OF HEART   . Long term current use of anticoagulant   . HYPOKALEMIA   . DM   . ANEMIA, IRON DEFICIENCY   . CHF (congestive heart failure)   . Senile cataract, unspecified   . Other specified disease of nail   . Unspecified hearing loss   . Chronic kidney disease, stage II (mild)   . Memory loss   . Unspecified urinary incontinence   . Abnormality of gait   . Unspecified hypothyroidism   . Unspecified vitamin D deficiency   . Anemia, unspecified   . Coronary atherosclerosis of native coronary artery   . Acute systolic heart failure   . Primary pulmonary hypertension   . Cardiomyopathy in other diseases classified elsewhere   . Other seborrheic dermatitis   . Other and  unspecified angina pectoris   . Other and unspecified hyperlipidemia   . Slowing of urinary stream   . Type II or unspecified type diabetes mellitus without mention of complication, not stated as uncontrolled   . Unspecified essential hypertension   . Esophageal reflux   . Edema   . Shortness of breath   . Hydrocele, unspecified   . Impotence of organic origin   . Edema    Past Surgical History  Procedure Laterality Date  . Cholecystectomy  1985  . Placement of a veriflex bare-metal sten    . Coronary angioplasty with stent placement      VeriFLEX bare-metal stent  . Right heart cardiac catherterization    . Coronary anteriogram  05/2009  . Doppler echocardiography  05/2009   Social History:  reports that he quit smoking about 16 years ago. His smoking use included Cigarettes. He has a 50 pack-year smoking history. He has never used smokeless tobacco. He reports that he does not drink alcohol or use illicit drugs.  No Known Allergies  Family History  Problem Relation Age of Onset  . Diabetes Mother   . Stroke Father   . Cancer Sister     do not leave blank  Prior to Admission medications   Medication Sig Start Date End Date Taking? Authorizing Provider  acetaminophen (TYLENOL) 500 MG tablet Take 500 mg by mouth every 6 (six) hours  as needed for moderate pain.   Yes Historical Provider, MD  amiodarone (PACERONE) 200 MG tablet TAKE 1 TABLET BY MOUTH EVERY DAY 11/25/14  Yes Kimber RelicArthur G Green, MD  aspirin EC 81 MG tablet Take 81 mg by mouth daily.   Yes Historical Provider, MD  donepezil (ARICEPT) 5 MG tablet TAKE 1 TABLET BY MOUTH AT BEDTIME 06/20/14  Yes Kimber RelicArthur G Green, MD  furosemide (LASIX) 20 MG tablet TAKE 2 TABLETS BY MOUTH EVERY DAY 09/30/14  Yes Tiffany L Reed, DO  Insulin Glargine (LANTUS SOLOSTAR) 100 UNIT/ML Solostar Pen Inject 5 units nightly to control diabetes 02/13/14  Yes Kimber RelicArthur G Green, MD  isosorbide mononitrate (IMDUR) 30 MG 24 hr tablet TAKE 1 TABLET EVERY DAY 09/09/14   Yes Sharon SellerJessica K Eubanks, NP  levothyroxine (SYNTHROID, LEVOTHROID) 50 MCG tablet TAKE 1 TABLET BY MOUTH ONCE DAILY 11/21/13  Yes Sharon SellerJessica K Eubanks, NP  mirtazapine (REMERON) 15 MG tablet TAKE 1 TABLET BY MOUTH AT BEDTIME TO HELP STIMULATE APPETITE   Yes Kimber RelicArthur G Green, MD  nebivolol (BYSTOLIC) 2.5 MG tablet One daily to control BP and heart rhythym 02/13/14  Yes Kimber RelicArthur G Green, MD  OLANZapine (ZYPREXA) 5 MG tablet Take one tablet by mouth once daily to help nerves 11/27/14  Yes Kimber RelicArthur G Green, MD  omeprazole (PRILOSEC) 40 MG capsule Take 40 mg by mouth daily.   Yes Historical Provider, MD  traMADol (ULTRAM) 50 MG tablet TAKE 1 TABLET BY MOUTH EVERY 6 HOURS AS NEEDED FOR PAIN ONLY   Yes Sharon SellerJessica K Eubanks, NP  B-D ULTRAFINE III SHORT PEN 31G X 8 MM MISC CHECK UP TO 3 TIMES DAILY AS DIRECTED 05/27/14   Tiffany L Reed, DO  glucose blood test strip Check blood sugar once daily as instructed E11.9 10/08/14   Kimber RelicArthur G Green, MD  nitroGLYCERIN (NITROSTAT) 0.4 MG SL tablet Place 1 tablet (0.4 mg total) under the tongue every 5 (five) minutes as needed for chest pain. 02/28/13   Kimber RelicArthur G Green, MD  [facility] DELICA LANCETS 33G MISC Check blood sugar once daily as directed E11.9 10/08/14   Kimber RelicArthur G Green, MD  Tdap Leda Min(BOOSTRIX) 5-2.5-18.5 LF-MCG/0.5 injection Inject 0.5 mLs into the muscle once. 06/05/14   Kimber RelicArthur G Green, MD   Physical Exam: Filed Vitals:   12/07/14 1600 12/07/14 1645 12/07/14 1700 12/07/14 1730  BP: 116/42 118/48 117/93 112/48  Pulse: 68 66 66 66  Temp:      TempSrc:      Resp: 17 16 14 14   Weight:      SpO2: 98% 98% 99% 99%    Wt Readings from Last 3 Encounters:  12/07/14 86.238 kg (190 lb 1.9 oz)  12/04/14 86.546 kg (190 lb 12.8 oz)  10/08/14 84.369 kg (186 lb)    General:  Appears calm and comfortable, SLIGHTLY CONFUSED.  Eyes: PERRL, normal lids, irises & conjunctiva Neck: no LAD, masses or thyromegaly Cardiovascular: RRR, no m/r/g. No LE edema. Respiratory: decreased at bases. No  rhonchi, scattered crackles.  Abdomen: soft, ntnd Skin: no rash or induration seen on limited exam Musculoskeletal: bilateral lower extremities are erythematous from the toes to the knees. Tender, Neurologic: grossly non-focal.          Labs on Admission:  Basic Metabolic Panel:  Recent Labs Lab 12/07/14 1545  NA 140  K 3.7  CL 104  CO2 25  GLUCOSE 108*  BUN 41*  CREATININE 2.77*  CALCIUM 8.4   Liver Function Tests:  Recent Labs Lab  12/07/14 1545  AST 40*  ALT 21  ALKPHOS 103  BILITOT 1.2  PROT 6.2  ALBUMIN 3.0*   No results for input(s): LIPASE, AMYLASE in the last 168 hours. No results for input(s): AMMONIA in the last 168 hours. CBC:  Recent Labs Lab 12/07/14 1545  WBC 9.4  NEUTROABS 7.6  HGB 12.2*  HCT 38.3*  MCV 94.3  PLT 99*   Cardiac Enzymes: No results for input(s): CKTOTAL, CKMB, CKMBINDEX, TROPONINI in the last 168 hours.  BNP (last 3 results)  Recent Labs  12/07/14 1600  BNP 456.9*    ProBNP (last 3 results) No results for input(s): PROBNP in the last 8760 hours.  CBG: No results for input(s): GLUCAP in the last 168 hours.  Radiological Exams on Admission: Dg Chest 2 View  12/07/2014   CLINICAL DATA:  Bilateral lower extremity swelling and erythema, symptoms for 4 days  EXAM: CHEST  2 VIEW  COMPARISON:  07/22/2013  FINDINGS: The heart size and mediastinal contours are within normal limits. Both lungs are clear. The visualized skeletal structures are unremarkable. The patient has had partly overlies the apices. Lungs are hypoaerated with crowding of the bronchovascular markings.  IMPRESSION: No focal abnormality, low volume exam. If symptoms persist, consider PA and lateral chest radiographs obtained at full inspiration when the patient is clinically able.   Electronically Signed   By: Christiana Pellant M.D.   On: 12/07/2014 16:35    EKG: sinus with PAC'S.   Assessment/Plan Active Problems:   Cellulitis of right leg    Cellulitis   Cellulitis of the lower extremities: Admit to med surg bed. Started on IV vancomycin and iv rocephin.  Elevate the legs.  Patient had a recent ABI done as outpatient and the results are pending.    Mild CHF exacerbation: Started the patient on IV lasix.  Daily weights.   Stage 4 CKD: creatinine at baseline.  Continue to monitor.     Anemia and mild thrombocytopenia: Monitor.  Diabetes Mellitus:  - hgba1c is ordered.  CBG (last 3)   Recent Labs  12/07/14 2140  GLUCAP 171*    Resume SSI.       Code Status: full code.  DVT Prophylaxis: Family Communication: family at bedside.  Disposition Plan: pending PT eval.   Time spent: 65 minutes.   [facility] Pager [phone]

## 2014-12-07 NOTE — ED Notes (Signed)
Admitting MD at bedside.

## 2014-12-07 NOTE — ED Notes (Signed)
Patient transported to X-ray 

## 2014-12-07 NOTE — ED Notes (Signed)
Phlebotomy at bedside drawing cultures. 

## 2014-12-08 LAB — BASIC METABOLIC PANEL
Anion gap: 8 (ref 5–15)
BUN: 36 mg/dL — ABNORMAL HIGH (ref 6–23)
CHLORIDE: 105 mmol/L (ref 96–112)
CO2: 25 mmol/L (ref 19–32)
Calcium: 8 mg/dL — ABNORMAL LOW (ref 8.4–10.5)
Creatinine, Ser: 2.48 mg/dL — ABNORMAL HIGH (ref 0.50–1.35)
GFR calc Af Amer: 26 mL/min — ABNORMAL LOW (ref >90)
GFR, EST NON AFRICAN AMERICAN: 22 mL/min — AB (ref >90)
Glucose, Bld: 116 mg/dL — ABNORMAL HIGH (ref 70–99)
POTASSIUM: 3.4 mmol/L — AB (ref 3.5–5.1)
SODIUM: 138 mmol/L (ref 135–145)

## 2014-12-08 LAB — GLUCOSE, CAPILLARY
GLUCOSE-CAPILLARY: 121 mg/dL — AB (ref 70–99)
GLUCOSE-CAPILLARY: 116 mg/dL — AB (ref 70–99)
GLUCOSE-CAPILLARY: 81 mg/dL (ref 70–99)
Glucose-Capillary: 127 mg/dL — ABNORMAL HIGH (ref 70–99)

## 2014-12-08 LAB — TSH: TSH: 6.055 u[IU]/mL — AB (ref 0.350–4.500)

## 2014-12-08 MED ORDER — POTASSIUM CHLORIDE CRYS ER 20 MEQ PO TBCR: 40.0000 meq | EXTENDED_RELEASE_TABLET | Freq: Two times a day (BID) | ORAL | Status: DC

## 2014-12-08 MED ORDER — POTASSIUM CHLORIDE CRYS ER 20 MEQ PO TBCR
40.0000 meq | EXTENDED_RELEASE_TABLET | Freq: Once | ORAL | Status: AC
  Administered 2014-12-08: 40 meq via ORAL
  Filled 2014-12-08: qty 2

## 2014-12-08 NOTE — Progress Notes (Signed)
[facility] PROGRESS NOTE  Candiss NorseOdais M Tweten YQI:[id number] DOB: 01-23-1930 DOA: 12/07/2014 PCP: Kimber RelicGREEN, ARTHUR G, MD  Assessment/Plan:  Cellulitis of the lower extremities: Admit to med surg. Started on IV vancomycin and IV rocephin. Awaiting ABI results.  His leg erythema is improving.  He denies any pain.    MILD acute systolic heart failure: Started on IV lasix and monitor his electrolytes while on lasix.    Stage 4 CKD: Improving with lasix.    Chronic atrial fibrillation: Rate controlled.  On amiodarone.    Diabetes Mellitus: CBG (last 3)   Recent Labs  12/08/14 0631 12/08/14 1138 12/08/14 1614  GLUCAP 121* 116* 127*    SSI.   Hypokalemia: Replete as needed.    Abnormal TSH: Free t4 and free t3 pending.    Code Status: full code.  Family Communication: none at bedside Disposition Plan: pending PT eval.    Consultants:  none  Procedures:  CXR  ABI   Antibiotics:  Vancomycin 4/2  Rocephin 4/2  HPI/Subjective: No new complaints.   Objective: Filed Vitals:   12/08/14 0458  BP: 134/58  Pulse: 72  Temp: 98 F (36.7 C)  Resp: 16    Intake/Output Summary (Last 24 hours) at 12/08/14 1921 Last data filed at 12/08/14 1300  Gross per 24 hour  Intake    360 ml  Output    850 ml  Net   -490 ml   Filed Weights   12/07/14 1531 12/08/14 0458  Weight: 86.238 kg (190 lb 1.9 oz) 85.3 kg (188 lb 0.8 oz)    Exam:   General:  Alert afebrile comfortable  Cardiovascular: s1s2  Respiratory: scattered rales at bases, no wheezing  Or rhonchi  Abdomen: soft non tender non distended bowel sounds heard  Musculoskeletal: no pedal edema.   Data Reviewed: Basic Metabolic Panel:  Recent Labs Lab 12/07/14 1545 12/08/14 0751  NA 140 138  K 3.7 3.4*  CL 104 105  CO2 25 25  GLUCOSE 108* 116*  BUN 41* 36*  CREATININE 2.77* 2.48*  CALCIUM 8.4 8.0*   Liver Function Tests:  Recent Labs Lab 12/07/14 1545  AST 40*  ALT 21   ALKPHOS 103  BILITOT 1.2  PROT 6.2  ALBUMIN 3.0*   No results for input(s): LIPASE, AMYLASE in the last 168 hours. No results for input(s): AMMONIA in the last 168 hours. CBC:  Recent Labs Lab 12/07/14 1545  WBC 9.4  NEUTROABS 7.6  HGB 12.2*  HCT 38.3*  MCV 94.3  PLT 99*   Cardiac Enzymes: No results for input(s): CKTOTAL, CKMB, CKMBINDEX, TROPONINI in the last 168 hours. BNP (last 3 results)  Recent Labs  12/07/14 1600  BNP 456.9*    ProBNP (last 3 results) No results for input(s): PROBNP in the last 8760 hours.  CBG:  Recent Labs Lab 12/07/14 2140 12/08/14 0631 12/08/14 1138 12/08/14 1614  GLUCAP 171* 121* 116* 127*    No results found for this or any previous visit (from the past 240 hour(s)).   Studies: Dg Chest 2 View  12/07/2014   CLINICAL DATA:  Bilateral lower extremity swelling and erythema, symptoms for 4 days  EXAM: CHEST  2 VIEW  COMPARISON:  07/22/2013  FINDINGS: The heart size and mediastinal contours are within normal limits. Both lungs are clear. The visualized skeletal structures are unremarkable. The patient has had partly overlies the apices. Lungs are hypoaerated with crowding of the bronchovascular markings.  IMPRESSION: No focal abnormality, low volume exam. If  symptoms persist, consider PA and lateral chest radiographs obtained at full inspiration when the patient is clinically able.   Electronically Signed   By: Christiana Pellant M.D.   On: 12/07/2014 16:35    Scheduled Meds: . amiodarone  200 mg Oral Daily  . aspirin EC  81 mg Oral Daily  . cefTRIAXone (ROCEPHIN)  IV  1 g Intravenous Q24H  . donepezil  5 mg Oral QHS  . enoxaparin (LOVENOX) injection  30 mg Subcutaneous Q24H  . furosemide  40 mg Intravenous Daily  . insulin aspart  0-5 Units Subcutaneous QHS  . insulin aspart  0-9 Units Subcutaneous TID WC  . insulin glargine  5 Units Subcutaneous QHS  . isosorbide mononitrate  30 mg Oral Daily  . levothyroxine  50 mcg Oral QAC  breakfast  . mirtazapine  15 mg Oral QHS  . nebivolol  2.5 mg Oral Daily  . OLANZapine  5 mg Oral Daily  . pantoprazole  40 mg Oral Daily  . potassium chloride  40 mEq Oral BID   Continuous Infusions:   Active Problems:   Cellulitis of right leg   Cellulitis    Time spent: 25 minutes    Jermone Geister  [facility] Pager [phone] If 7PM-7AM, please contact night-coverage at [email or web address], password [facility] 12/08/2014, 7:21 PM  LOS: 1 day

## 2014-12-09 LAB — CBC
HCT: 34.3 % — ABNORMAL LOW (ref 39.0–52.0)
Hemoglobin: 11.1 g/dL — ABNORMAL LOW (ref 13.0–17.0)
MCH: 30 pg (ref 26.0–34.0)
MCHC: 32.4 g/dL (ref 30.0–36.0)
MCV: 92.7 fL (ref 78.0–100.0)
PLATELETS: 118 10*3/uL — AB (ref 150–400)
RBC: 3.7 MIL/uL — ABNORMAL LOW (ref 4.22–5.81)
RDW: 13.8 % (ref 11.5–15.5)
WBC: 7.4 10*3/uL (ref 4.0–10.5)

## 2014-12-09 LAB — GLUCOSE, CAPILLARY
GLUCOSE-CAPILLARY: 160 mg/dL — AB (ref 70–99)
Glucose-Capillary: 100 mg/dL — ABNORMAL HIGH (ref 70–99)
Glucose-Capillary: 183 mg/dL — ABNORMAL HIGH (ref 70–99)
Glucose-Capillary: 141 mg/dL — ABNORMAL HIGH (ref 70–99)

## 2014-12-09 LAB — T4, FREE: FREE T4: 1.12 ng/dL (ref 0.80–1.80)

## 2014-12-09 LAB — URINE CULTURE: Colony Count: 80000

## 2014-12-09 LAB — BASIC METABOLIC PANEL
Anion gap: 10 (ref 5–15)
BUN: 33 mg/dL — AB (ref 6–23)
CHLORIDE: 106 mmol/L (ref 96–112)
CO2: 24 mmol/L (ref 19–32)
Calcium: 8.1 mg/dL — ABNORMAL LOW (ref 8.4–10.5)
Creatinine, Ser: 2.27 mg/dL — ABNORMAL HIGH (ref 0.50–1.35)
GFR, EST AFRICAN AMERICAN: 29 mL/min — AB (ref >90)
GFR, EST NON AFRICAN AMERICAN: 25 mL/min — AB (ref >90)
Glucose, Bld: 112 mg/dL — ABNORMAL HIGH (ref 70–99)
POTASSIUM: 3.9 mmol/L (ref 3.5–5.1)
SODIUM: 140 mmol/L (ref 135–145)

## 2014-12-09 LAB — HEMOGLOBIN A1C
Hgb A1c MFr Bld: 6.2 % — ABNORMAL HIGH (ref 4.8–5.6)
Mean Plasma Glucose: 131 mg/dL

## 2014-12-09 NOTE — Progress Notes (Signed)
[facility] PROGRESS NOTE  Joel Walker ZOX:[id number] DOB: March 14, 1930 DOA: 12/07/2014 PCP: Kimber Relic, MD  Assessment/Plan:  Cellulitis of the lower extremities: Admit to med surg. Started on IV vancomycin and IV rocephin.ABI done outpatient, trying to get hold of the records.  His leg erythema is improving, he currently denies any pain.  Recommend to continue 24 hours of antibiotics and plan to transition to oral antibiotics in am.    MILD acute systolic heart failure: Started on IV lasix and monitor his electrolytes while on lasix.    Stage 4 CKD: Improving with lasix.    Chronic atrial fibrillation: Rate controlled.  On amiodarone.    Diabetes Mellitus: CBG (last 3)   Recent Labs  12/09/14 0635 12/09/14 1111 12/09/14 1557  GLUCAP 100* 183* 141*    SSI.  hgba1c is 6.2  Hypokalemia: Replete as needed.    Abnormal TSH: Free t4 normal   Mild thrombocytopenia; Stable.   Code Status: full code.  Family Communication: discussed with sister at bedside Disposition Plan: pending PT eval.    Consultants:  none  Procedures:  CXR  ABI   Antibiotics:  Vancomycin 4/2  Rocephin 4/2  HPI/Subjective: No new complaints.   Objective: Filed Vitals:   12/09/14 1430  BP: 101/51  Pulse: 66  Temp: 99.4 F (37.4 C)  Resp: 18    Intake/Output Summary (Last 24 hours) at 12/09/14 1724 Last data filed at 12/09/14 0800  Gross per 24 hour  Intake    360 ml  Output   1350 ml  Net   -990 ml   Filed Weights   12/07/14 1531 12/08/14 0458 12/09/14 0636  Weight: 86.238 kg (190 lb 1.9 oz) 85.3 kg (188 lb 0.8 oz) 87.6 kg (193 lb 2 oz)    Exam:   General:  Alert afebrile comfortable  Cardiovascular: s1s2  Respiratory: scattered rales at bases, no wheezing  Or rhonchi  Abdomen: soft non tender non distended bowel sounds heard  Musculoskeletal: no pedal edema.   Data Reviewed: Basic Metabolic Panel:  Recent Labs Lab  12/07/14 1545 12/08/14 0751 12/09/14 0540  NA 140 138 140  K 3.7 3.4* 3.9  CL 104 105 106  CO2 GLUCOSE 108* 116* 112*  BUN 41* 36* 33*  CREATININE 2.77* 2.48* 2.27*  CALCIUM 8.4 8.0* 8.1*   Liver Function Tests:  Recent Labs Lab 12/07/14 1545  AST 40*  ALT 21  ALKPHOS 103  BILITOT 1.2  PROT 6.2  ALBUMIN 3.0*   No results for input(s): LIPASE, AMYLASE in the last 168 hours. No results for input(s): AMMONIA in the last 168 hours. CBC:  Recent Labs Lab 12/07/14 1545 12/09/14 0540  WBC 9.4 7.4  NEUTROABS 7.6  --   HGB 12.2* 11.1*  HCT 38.3* 34.3*  MCV 94.3 92.7  PLT 99* 118*   Cardiac Enzymes: No results for input(s): CKTOTAL, CKMB, CKMBINDEX, TROPONINI in the last 168 hours. BNP (last 3 results)  Recent Labs  12/07/14 1600  BNP 456.9*    ProBNP (last 3 results) No results for input(s): PROBNP in the last 8760 hours.  CBG:  Recent Labs Lab 12/08/14 1614 12/08/14 2152 12/09/14 0635 12/09/14 1111 12/09/14 1557  GLUCAP 127* 81 100* 183* 141*    Recent Results (from the past 240 hour(s))  Blood culture (routine x 2)     Status: None (Preliminary result)   Collection Time: 12/07/14  6:18 PM  Result Value Ref Range Status  Specimen Description BLOOD RIGHT ARM  Final   Special Requests BOTTLES DRAWN AEROBIC AND ANAEROBIC  10 BLUE 5 RED  Final   Culture   Final           BLOOD CULTURE RECEIVED NO GROWTH TO DATE CULTURE WILL BE HELD FOR 5 DAYS BEFORE ISSUING A FINAL NEGATIVE REPORT [facility]formed [facility]    Report Status PENDING  Incomplete  Blood culture (routine x 2)     Status: None (Preliminary result)   Collection Time: 12/07/14  6:23 PM  Result Value Ref Range Status   Specimen Description BLOOD RIGHT HAND  Final   Special Requests BOTTLES DRAWN AEROBIC ONLY 5 CC  Final   Culture   Final           BLOOD CULTURE RECEIVED NO GROWTH TO DATE CULTURE WILL BE HELD FOR 5 DAYS BEFORE ISSUING A FINAL NEGATIVE REPORT Performed at  [facility]    Report Status PENDING  Incomplete  Urine culture     Status: None   Collection Time: 12/07/14  6:50 PM  Result Value Ref Range Status   Specimen Description URINE, CLEAN CATCH  Final   Special Requests NONE  Final   Colony Count   Final    80,000 COLONIES/ML Performed at [facility]    Culture   Final    Multiple bacterial morphotypes present, none predominant. Suggest appropriate recollection if clinically indicated. Performed at [facility]    Report Status 12/09/2014 FINAL  Final     Studies: No results found.  Scheduled Meds: . amiodarone  200 mg Oral Daily  . aspirin EC  81 mg Oral Daily  . cefTRIAXone (ROCEPHIN)  IV  1 g Intravenous Q24H  . donepezil  5 mg Oral QHS  . enoxaparin (LOVENOX) injection  30 mg Subcutaneous Q24H  . furosemide  40 mg Intravenous Daily  . insulin aspart  0-5 Units Subcutaneous QHS  . insulin aspart  0-9 Units Subcutaneous TID WC  . insulin glargine  5 Units Subcutaneous QHS  . isosorbide mononitrate  30 mg Oral Daily  . levothyroxine  50 mcg Oral QAC breakfast  . mirtazapine  15 mg Oral QHS  . nebivolol  2.5 mg Oral Daily  . OLANZapine  5 mg Oral Daily  . pantoprazole  40 mg Oral Daily   Continuous Infusions:   Active Problems:   Cellulitis of right leg   Cellulitis    Time spent: 25 minutes    [name]  [facility] Pager [phone] If 7PM-7AM, please contact night-coverage at [email or web address], password [email or web address] 12/09/2014, 5:24 PM  LOS: 2 days

## 2014-12-10 LAB — BASIC METABOLIC PANEL
ANION GAP: 9 (ref 5–15)
Anion gap: 13 (ref 5–15)
BUN: 28 mg/dL — ABNORMAL HIGH (ref 6–23)
BUN: 29 mg/dL — ABNORMAL HIGH (ref 6–23)
CALCIUM: 8.4 mg/dL (ref 8.4–10.5)
CHLORIDE: 107 mmol/L (ref 96–112)
CO2: 27 mmol/L (ref 19–32)
CO2: 23 mmol/L (ref 19–32)
Calcium: 8.6 mg/dL (ref 8.4–10.5)
Chloride: 106 mmol/L (ref 96–112)
Creatinine, Ser: 2.01 mg/dL — ABNORMAL HIGH (ref 0.50–1.35)
Creatinine, Ser: 1.86 mg/dL — ABNORMAL HIGH (ref 0.50–1.35)
GFR calc Af Amer: 34 mL/min — ABNORMAL LOW (ref >90)
GFR calc Af Amer: 37 mL/min — ABNORMAL LOW (ref >90)
GFR calc non Af Amer: 32 mL/min — ABNORMAL LOW (ref >90)
GFR, EST NON AFRICAN AMERICAN: 29 mL/min — AB (ref >90)
GLUCOSE: 91 mg/dL (ref 70–99)
Glucose, Bld: 109 mg/dL — ABNORMAL HIGH (ref 70–99)
POTASSIUM: 4.1 mmol/L (ref 3.5–5.1)
Potassium: 3.7 mmol/L (ref 3.5–5.1)
SODIUM: 142 mmol/L (ref 135–145)
SODIUM: 143 mmol/L (ref 135–145)

## 2014-12-10 LAB — GLUCOSE, CAPILLARY
GLUCOSE-CAPILLARY: 108 mg/dL — AB (ref 70–99)
Glucose-Capillary: 117 mg/dL — ABNORMAL HIGH (ref 70–99)
Glucose-Capillary: 95 mg/dL (ref 70–99)
Glucose-Capillary: 121 mg/dL — ABNORMAL HIGH (ref 70–99)

## 2014-12-10 LAB — T3, FREE: T3, Free: 0.8 pg/mL — ABNORMAL LOW (ref 2.0–4.4)

## 2014-12-10 MED ORDER — AMOXICILLIN-POT CLAVULANATE 875-125 MG PO TABS
1.0000 | ORAL_TABLET | Freq: Two times a day (BID) | ORAL | Status: DC
  Administered 2014-12-10 – 2014-12-12 (×4): 1 via ORAL
  Filled 2014-12-10 (×4): qty 1

## 2014-12-10 MED ORDER — FUROSEMIDE 40 MG PO TABS
40.0000 mg | ORAL_TABLET | Freq: Every day | ORAL | Status: DC
Start: 2014-12-11 — End: 2014-12-12
  Administered 2014-12-11 – 2014-12-12 (×2): 40 mg via ORAL
  Filled 2014-12-10 (×2): qty 1

## 2014-12-10 NOTE — Progress Notes (Signed)
[facility] PROGRESS NOTE  Joel Walker ZOX:[id number] DOB: May 16, 1930 DOA: 12/07/2014 PCP: Kimber Relic, MD Interim summary: 81 year old male with h/o hypertension, chronic systolic heart failure, admitted for cellulitis of the lower extremities and mild CHF exacerbation. Assessment/Plan:  Cellulitis of the lower extremities: Admit to med surg. Started on IV vancomycin and IV rocephin.,  His erythema and infection improving. Plan to change to oral antibiotics today to augmentin to complete the course . ABI done outpatient showed 1.09.   PT evaluation.    MILD acute systolic heart failure: Started on IV lasix,  Improved. Transition to oral lasix today. Electrolytes within normal limits. Renal function improved.    Stage 4 CKD: Improving with lasix.    Chronic atrial fibrillation: Rate controlled.  On amiodarone.    Diabetes Mellitus: CBG (last 3)   Recent Labs  12/10/14 0632 12/10/14 1121 12/10/14 1620  GLUCAP 117* 95 108*    SSI.  hgba1c is 6.2  Hypokalemia: Replete as needed.    Abnormal TSH: Free t4 normal   Mild thrombocytopenia; Stable.   Code Status: full code.  Family Communication: discussed with sister over the phone.  Disposition Plan: pending PT eval.    Consultants:  none  Procedures:  CXR  ABI   Antibiotics:  Vancomycin 4/2- 4/5  Rocephin 4/2- 4/5  HPI/Subjective: No new complaints.   Objective: Filed Vitals:   12/10/14 1400  BP: 148/64  Pulse: 64  Temp: 98 F (36.7 C)  Resp: 20    Intake/Output Summary (Last 24 hours) at 12/10/14 1813 Last data filed at 12/10/14 1230  Gross per 24 hour  Intake      0 ml  Output      0 ml  Net      0 ml   Filed Weights   12/08/14 0458 12/09/14 0636 12/10/14 0700  Weight: 85.3 kg (188 lb 0.8 oz) 87.6 kg (193 lb 2 oz) 85.9 kg (189 lb 6 oz)    Exam:   General:  Alert afebrile comfortable  Cardiovascular: s1s2  Respiratory: air entry fair,  no wheezing  Or  rhonchi  Abdomen: soft non tender non distended bowel sounds heard  Musculoskeletal: no pedal edema.   Skin:lower extremity erythema improved, blisters on the toes.   Data Reviewed: Basic Metabolic Panel:  Recent Labs Lab 12/07/14 1545 12/08/14 0751 12/09/14 0540 12/10/14 1329 12/10/14 1600  NA 140 138 140 142 143  K 3.7 3.4* 3.9 3.7 4.1  CL 104 105 106 106 107  CO2 GLUCOSE 108* 116* 112* 109* 91  BUN 41* 36* 33* 28* 29*  CREATININE 2.77* 2.48* 2.27* 2.01* 1.86*  CALCIUM 8.4 8.0* 8.1* 8.4 8.6   Liver Function Tests:  Recent Labs Lab 12/07/14 1545  AST 40*  ALT 21  ALKPHOS 103  BILITOT 1.2  PROT 6.2  ALBUMIN 3.0*   No results for input(s): LIPASE, AMYLASE in the last 168 hours. No results for input(s): AMMONIA in the last 168 hours. CBC:  Recent Labs Lab 12/07/14 1545 12/09/14 0540  WBC 9.4 7.4  NEUTROABS 7.6  --   HGB 12.2* 11.1*  HCT 38.3* 34.3*  MCV 94.3 92.7  PLT 99* 118*   Cardiac Enzymes: No results for input(s): CKTOTAL, CKMB, CKMBINDEX, TROPONINI in the last 168 hours. BNP (last 3 results)  Recent Labs  12/07/14 1600  BNP 456.9*    ProBNP (last 3 results) No results for input(s): PROBNP in the last 8760  hours.  CBG:  Recent Labs Lab 12/09/14 1557 12/09/14 2145 12/10/14 0632 12/10/14 1121 12/10/14 1620  GLUCAP 141* 160* 117* 95 108*    Recent Results (from the past 240 hour(s))  Blood culture (routine x 2)     Status: None (Preliminary result)   Collection Time: 12/07/14  6:18 PM  Result Value Ref Range Status   Specimen Description BLOOD RIGHT ARM  Final   Special Requests BOTTLES DRAWN AEROBIC AND ANAEROBIC  10 BLUE 5 RED  Final   Culture   Final           BLOOD CULTURE RECEIVED NO GROWTH TO DATE CULTURE WILL BE HELD FOR 5 DAYS BEFORE ISSUING A FINAL NEGATIVE REPORT Performed at [facility]    Report Status PENDING  Incomplete  Blood culture (routine x 2)     Status: None (Preliminary result)    Collection Time: 12/07/14  6:23 PM  Result Value Ref Range Status   Specimen Description BLOOD RIGHT HAND  Final   Special Requests BOTTLES DRAWN AEROBIC ONLY 5 CC  Final   Culture   Final           BLOOD CULTURE RECEIVED NO GROWTH TO DATE CULTURE WILL BE HELD FOR 5 DAYS BEFORE ISSUING A FINAL NEGATIVE REPORT Performed at [facility]    Report Status PENDING  Incomplete  Urine culture     Status: None   Collection Time: 12/07/14  6:50 PM  Result Value Ref Range Status   Specimen Description URINE, CLEAN CATCH  Final   Special Requests NONE  Final   Colony Count   Final    80,000 COLONIES/ML Performed at [facility]    Culture   Final    Multiple bacterial morphotypes present, none predominant. Suggest appropriate recollection if clinically indicated. Performed at [facility]    Report Status 12/09/2014 FINAL  Final     Studies: No results found.  Scheduled Meds: . amiodarone  200 mg Oral Daily  . aspirin EC  81 mg Oral Daily  . cefTRIAXone (ROCEPHIN)  IV  1 g Intravenous Q24H  . donepezil  5 mg Oral QHS  . enoxaparin (LOVENOX) injection  30 mg Subcutaneous Q24H  . [START ON 12/11/2014] furosemide  40 mg Oral Daily  . insulin aspart  0-5 Units Subcutaneous QHS  . insulin aspart  0-9 Units Subcutaneous TID WC  . insulin glargine  5 Units Subcutaneous QHS  . isosorbide mononitrate  30 mg Oral Daily  . levothyroxine  50 mcg Oral QAC breakfast  . mirtazapine  15 mg Oral QHS  . nebivolol  2.5 mg Oral Daily  . OLANZapine  5 mg Oral Daily  . pantoprazole  40 mg Oral Daily   Continuous Infusions:   Active Problems:   Cellulitis of right leg   Cellulitis    Time spent: 25 minutes    Anslee Micheletti  [facility] Pager [phone] If 7PM-7AM, please contact night-coverage at [email or web address], password [email or web address] 12/10/2014, 6:13 PM  LOS: 3 days

## 2014-12-10 NOTE — Care Management Note (Signed)
CARE MANAGEMENT NOTE 12/10/2014  Patient:  Candiss NorseFARRINGTON,Jourden M   Account Number:  [id number]  Date Initiated:  12/10/2014  Documentation initiated by:  Vance PeperBRADY,Arnika Larzelere  Subjective/Objective Assessment:   81 yr old male admitted with bilateral feet cellulitis.     Action/Plan:   Patient's sister states he lives at home with 24hr private pay care. Care provided through [facility]. Patient will return home at discharge, will not go to SNF.  CM will continue to monitor.

## 2014-12-10 NOTE — Progress Notes (Signed)
Patient refused to have temperature taken this am orally or axillary.

## 2014-12-10 NOTE — Consult Note (Addendum)
WOC wound consult note Reason for Consult: Evaluation and treatment recommendations for bilateral toe blisters Wound type: Blisters--some intact, some have ruptured.   Pressure Ulcer POA: N/A Measurement: R Great: 0.5X1.5X0, ruptured blister with intact blackish-red scab. No drainage or odor noted. R #2: 1X1X0, Intact blood filled blister.  No drainage or odor noted. R #3: 0.75X0.75X0, ruptured blister with intact blackish-red scab.  No drainage or odor noted. R #4: 2X1X0, intact blood filled blister.  No drainage or odor noted. R #5: No wound noted. L Great: 1.5X2X0, intact serous filled blister.  No drainage or odor noted. L #2: 0.75X0.5X0, ruptured blister with intact blackish-red scab.  No odor or drainage noted. L #3: No wound noted. L #4: 0.5X0.75X0 ruptured blister with intact blackish-red scab.  No odor or drainage noted. L #5: 2.25X1X0 intact blood filled blister.  No odor or drainage noted. Periwound: Redness  Dressing procedure/placement/frequency: Pt from home, mostly bedbound receiving 24 hour care.  At this time, pt cannot give me an accurate or detailed history as to how or when these wounds developed. Noted that patient has been following [facility] as an outpatient and has recently had ABIs to determine vascular efficiency of lower extremities.  Currently being treated for cellulitis of lower extremities with IV antibiotics.  At this time, recommend xeroform gauze interwoven between toes to keep areas covered and moistened (anticipate further rupturing of intact blisters), wrap with kerlix.  Change daily.  No family at bedside to discuss plan of care.    Please re-consult if further assistance is needed.  Thank-you,  Carita PianAllyson Kirkman, BSN, RN-BC, Graduate Student Cammie Mcgeeawn Syna Gad MSN, RN, CWOCN, [place]olaWCN-AP, CNS [phone]

## 2014-12-11 ENCOUNTER — Ambulatory Visit: Payer: Medicare Other | Admitting: Internal Medicine

## 2014-12-11 ENCOUNTER — Other Ambulatory Visit: Payer: Self-pay | Admitting: Nurse Practitioner

## 2014-12-11 DIAGNOSIS — E1122 Type 2 diabetes mellitus with diabetic chronic kidney disease: Secondary | ICD-10-CM

## 2014-12-11 DIAGNOSIS — I480 Paroxysmal atrial fibrillation: Secondary | ICD-10-CM

## 2014-12-11 DIAGNOSIS — I10 Essential (primary) hypertension: Secondary | ICD-10-CM

## 2014-12-11 DIAGNOSIS — N183 Chronic kidney disease, stage 3 (moderate): Secondary | ICD-10-CM

## 2014-12-11 DIAGNOSIS — N189 Chronic kidney disease, unspecified: Secondary | ICD-10-CM

## 2014-12-11 DIAGNOSIS — S90829A Blister (nonthermal), unspecified foot, initial encounter: Secondary | ICD-10-CM

## 2014-12-11 LAB — BASIC METABOLIC PANEL
Anion gap: 8 (ref 5–15)
BUN: 26 mg/dL — ABNORMAL HIGH (ref 6–23)
CO2: 25 mmol/L (ref 19–32)
CREATININE: 1.77 mg/dL — AB (ref 0.50–1.35)
Calcium: 8.2 mg/dL — ABNORMAL LOW (ref 8.4–10.5)
Chloride: 107 mmol/L (ref 96–112)
GFR calc Af Amer: 39 mL/min — ABNORMAL LOW (ref >90)
GFR calc non Af Amer: 34 mL/min — ABNORMAL LOW (ref >90)
GLUCOSE: 107 mg/dL — AB (ref 70–99)
POTASSIUM: 3.8 mmol/L (ref 3.5–5.1)
Sodium: 140 mmol/L (ref 135–145)

## 2014-12-11 LAB — CBC
HCT: 33.5 % — ABNORMAL LOW (ref 39.0–52.0)
HEMOGLOBIN: 10.6 g/dL — AB (ref 13.0–17.0)
MCH: 29.4 pg (ref 26.0–34.0)
MCHC: 31.6 g/dL (ref 30.0–36.0)
MCV: 92.8 fL (ref 78.0–100.0)
Platelets: 160 10*3/uL (ref 150–400)
RBC: 3.61 MIL/uL — AB (ref 4.22–5.81)
RDW: 13.7 % (ref 11.5–15.5)
WBC: 6.6 10*3/uL (ref 4.0–10.5)

## 2014-12-11 LAB — GLUCOSE, CAPILLARY
GLUCOSE-CAPILLARY: 92 mg/dL (ref 70–99)
Glucose-Capillary: 99 mg/dL (ref 70–99)
Glucose-Capillary: 124 mg/dL — ABNORMAL HIGH (ref 70–99)
Glucose-Capillary: 147 mg/dL — ABNORMAL HIGH (ref 70–99)

## 2014-12-11 NOTE — Evaluation (Signed)
Physical Therapy Evaluation Patient Details Name: Joel Walker MRN: [id number] DOB: 09-Jun-1930 Today's Date: 12/11/2014   History of Present Illness  81 y.o. male admitted to [facility] on 12/07/14 with bil leg weakness and cellulitis as well as wounds on bil feet and sacrum.   Pt with significant PMHx of anemia, acute systolic heart failure, pulmonary hypertension, DM, essential HTN, SOB, and coronary angioplasty with stent.    Clinical Impression  Pt is significantly deconditioned compared to his baseline.  He was, within the past week, a household ambulator with min assist and RW.  He was able to, with assist get into and out of the house and car.  He is currently hardly able to get from the bed to the recliner chair with a RW.  He is most appropriate for SNF level rehab at discharge, but is adamantly refusing this option. He is open to HHPT and ambulance transport home.   PT to follow acutely for deficits listed below.       Follow Up Recommendations SNF;Other (comment) (pt refusing SNF, HHPT, ambulance transport home. )    Equipment Recommendations  None recommended by PT (since pt doesn't use hospital bed no overlay)    Recommendations for Other Services   NA    Precautions / Restrictions Precautions Precautions: Fall Precaution Comments: back and to the right is his tendency.       Mobility  Bed Mobility Overal bed mobility: Needs Assistance Bed Mobility: Supine to Sit     Supine to sit: Mod assist     General bed mobility comments: Mod asist to support trunk to get to sitting.  Pt pulling on bed rail with bil arms.   Transfers Overall transfer level: Needs assistance Equipment used: Rolling walker (2 wheeled) Transfers: Sit to/from [employer] Sit to Stand: Mod assist;From elevated surface Stand pivot transfers: Mod assist;From elevated surface       General transfer comment: Mod assist to support trunk to get to standing from elevated bed.  Mod assist  to stand and turn with RW to recliner chair on pt's right.  He was not able to square off his hips before sitting and dove with uncontrolled premature sitting to recliner chair.   Ambulation/Gait             General Gait Details: unable at this time.  Did preform pre gait stepping forward and backward EOB with RW 2-4 steps x2 reps with seated rest break before transferring to chair.       Balance Overall balance assessment: Needs assistance Sitting-balance support: Feet supported;Bilateral upper extremity supported Sitting balance-Leahy Scale: Fair     Standing balance support: Bilateral upper extremity supported Standing balance-Leahy Scale: Poor                               Pertinent Vitals/Pain Pain Assessment: No/denies pain    Home Living Family/patient expects to be discharged to:: Private residence Living Arrangements: Alone Available Help at Discharge: Personal care attendant;Available 24 hours/day (through [facility]) Type of Home: House Home Access: Stairs to enter Entrance Stairs-Rails: Doctor, general practice of Steps: 5-7 Home Layout: One level Home Equipment: Walker - 2 wheels;Bedside commode;Wheelchair - Careers adviser (comment);Hospital bed (lift chair) Additional Comments: Pt sleeps in his lift chair, refuses to sleep in the hospital bed.     Prior Function Level of Independence: Needs assistance   Gait / Transfers Assistance Needed: assist with gait with  RW.  At baseline he walks to the bathroom and to the kitchen.  Not much else during the day.               Extremity/Trunk Assessment   Upper Extremity Assessment: Generalized weakness           Lower Extremity Assessment: RLE deficits/detail;LLE deficits/detail RLE Deficits / Details: bil legs with generalized weakness and deconditioning.  He needs support to stand and legs are visibly weak and shaky.  LLE Deficits / Details: bil legs with generalized weakness  and deconditioning.  He needs support to stand and legs are visibly weak and shaky.   Cervical / Trunk Assessment: Other exceptions;Kyphotic  Communication   Communication: No difficulties  Cognition Arousal/Alertness: Awake/alert Behavior During Therapy: WFL for tasks assessed/performed Overall Cognitive Status: History of cognitive impairments - at baseline                      General Comments General comments (skin integrity, edema, etc.): Educated pt and family re: pressure relief, general activity progression at home and recommendations of HHPT.  Family with questions, addressed by PT.     Exercises Total Joint Exercises Ankle Circles/Pumps: AROM;Both;10 reps;Supine Quad Sets: AROM;Both;10 reps;Supine Heel Slides: AAROM;Both;10 reps;Supine Hip ABduction/ADduction: AAROM;Both;10 reps;Supine      Assessment/Plan    PT Assessment Patient needs continued PT services  PT Diagnosis Difficulty walking;Abnormality of gait;Generalized weakness   PT Problem List Decreased strength;Decreased activity tolerance;Decreased balance;Decreased mobility;Decreased knowledge of use of DME;Decreased skin integrity  PT Treatment Interventions DME instruction;Gait training;Stair training;Functional mobility training;Therapeutic activities;Therapeutic exercise;Balance training;Neuromuscular re-education;Cognitive remediation;Patient/family education   PT Goals (Current goals can be found in the Care Plan section) Acute Rehab PT Goals Patient Stated Goal: to go home to his dog PT Goal Formulation: With patient Time For Goal Achievement: 12/25/14 Potential to Achieve Goals: Fair    Frequency Min 3X/week   Barriers to discharge Inaccessible home environment has 5-7 STE home, will need ambulance transport home.        End of Session Equipment Utilized During Treatment: Gait belt Activity Tolerance: Patient limited by fatigue Patient left: in chair;with call bell/phone within reach;with  chair alarm set;with family/visitor present Nurse Communication: Mobility status         Time: 1525-1646 (20 mins spent talking with family re: their own physical pro) PT Time Calculation (min) (ACUTE ONLY): 81 min   Charges:   PT Evaluation $Initial PT Evaluation Tier I: 1 Procedure PT Treatments $Therapeutic Exercise: 8-22 mins $Therapeutic Activity: 8-22 mins $Self Care/Home Management: 8-22        Gissela Bloch B. Kasarah Sitts, PT, DPT [phone]   12/11/2014, 5:00 PM

## 2014-12-11 NOTE — Progress Notes (Signed)
[facility]                                                                              Patient Demographics  Joel Walker, is a 81 y.o. male, DOB - 01-Dec-1929, ZOX:[id number]  Admit date - 12/07/2014   Admitting Physician Kathlen Mody, MD  Outpatient Primary MD for the patient is GREEN, Lenon Curt, MD  LOS - 4   Chief Complaint  Patient presents with  . Leg Swelling      HPI on 12/07/2014 by Dr. Kathlen Mody Joel Walker is a 81 y.o. male with prior h/o hypertension, chronic systolic heart failure, cardiomyopathy, mostly in bed, has 24 hour care at home, was brought in by his family for worsening lower extremity redness and infection. He recently saw Dr Chilton Si and was given levaquin. He also underwent ABI's recently. Results are pending. On arrival to ED, he was found to have worsening bilateral lower extremity redness . He was referred to medical service for admission for lower extremity cellulitis. In addition he reports some sob, but not hypoxic. His CXR does not reveal any focal abnormality  Assessment & Plan    Cellulitis of the lower extremities -Initially placed on vancomycin and rocephin -Transitioned to PO antibiotics, augmentin -ABI done outpatient showed 1.09.  -PT eval pending  Acute Systolic/Diastolic heart failure -Echocardiogram 01/03/2013: EF 30%, grade 1 diastolic dysfunction -Continue lasix -Monitor daily weights, intake/output  Chronic kidney disease, Stage IV -Creatinine improving, continue to monitor BMP  Chronic atrial fibrillation -Currently Rate controlled.  -Continue amiodarone  Diabetes Mellitus, type 2 -Continue ISS and CBG monitoring  -Home regimen held hgba1c is 6.2  Hypokalemia -Resolved, continue to monitor and replete as needed.   Hypothyroidism -Continue synthroid  Mild thrombocytopenia -stable, resolved  Code Status: Full  Family Communication: None at bedside  Disposition Plan: Admitted.  Pending PT  consult  Time Spent in minutes   30 minutes  Procedures  None   Consults   None  DVT Prophylaxis  Lovenox  Lab Results  Component Value Date   PLT 160 12/11/2014    Medications  Scheduled Meds: . amiodarone  200 mg Oral Daily  . amoxicillin-clavulanate  1 tablet Oral Q12H  . aspirin EC  81 mg Oral Daily  . donepezil  5 mg Oral QHS  . enoxaparin (LOVENOX) injection  30 mg Subcutaneous Q24H  . furosemide  40 mg Oral Daily  . insulin aspart  0-5 Units Subcutaneous QHS  . insulin aspart  0-9 Units Subcutaneous TID WC  . insulin glargine  5 Units Subcutaneous QHS  . isosorbide mononitrate  30 mg Oral Daily  . levothyroxine  50 mcg Oral QAC breakfast  . mirtazapine  15 mg Oral QHS  . nebivolol  2.5 mg Oral Daily  . OLANZapine  5 mg Oral Daily  . pantoprazole  40 mg Oral Daily   Continuous Infusions:  PRN Meds:.nitroGLYCERIN, traMADol  Antibiotics    Anti-infectives    Start     Dose/Rate Route Frequency Ordered Stop   12/10/14 2200  amoxicillin-clavulanate (AUGMENTIN) 875-125 MG per tablet 1 tablet     1 tablet Oral Every 12 hours 12/10/14 1824  12/07/14 1845  cefTRIAXone (ROCEPHIN) 1 g in dextrose 5 % 50 mL IVPB  Status:  Discontinued     1 g 100 mL/hr over 30 Minutes Intravenous Every 24 hours 12/07/14 1839 12/10/14 1824   12/07/14 1700  vancomycin (VANCOCIN) 1,250 mg in sodium chloride 0.9 % 250 mL IVPB     1,250 mg 166.7 mL/hr over 90 Minutes Intravenous  Once 12/07/14 1648 12/07/14 1953       Subjective:   Joel Walker seen and examined today.  Continues to have pain in his feet, more in right leg. Denies chest pain, shortness of breath, abdominal pain.   Objective:   Filed Vitals:   12/10/14 1400 12/10/14 2126 12/11/14 0500 12/11/14 0519  BP: 148/64 124/60  135/59  Pulse: 64 58  60  Temp: 98 F (36.7 C) 97.8 F (36.6 C)  98.5 F (36.9 C)  TempSrc:      Resp: 20 18  16   Weight:   86.8 kg (191 lb 5.8 oz)   SpO2: 97% 100%  95%    Wt  Readings from Last 3 Encounters:  12/11/14 86.8 kg (191 lb 5.8 oz)  12/04/14 86.546 kg (190 lb 12.8 oz)  10/08/14 84.369 kg (186 lb)     Intake/Output Summary (Last 24 hours) at 12/11/14 1219 Last data filed at 12/10/14 1805  Gross per 24 hour  Intake     60 ml  Output    300 ml  Net   -240 ml    Exam  General: Well developed, well nourished, NAD  HEENT: NCAT, mucous membranes moist.   Cardiovascular: S1 S2 auscultated, no rubs, murmurs or gallops. Regular rate and rhythm.  Respiratory: Clear to auscultation bilaterally with equal chest rise  Abdomen: Soft, nontender, nondistended, + bowel sounds  Extremities: warm dry without cyanosis clubbing or edema in LE.  Both feet currently wrapped- erythema with blisters on toes.   Neuro: AAOx2, nonfocal  Data Review   Micro Results Recent Results (from the past 240 hour(s))  Blood culture (routine x 2)     Status: None (Preliminary result)   Collection Time: 12/07/14  6:18 PM  Result Value Ref Range Status   Specimen Description BLOOD RIGHT ARM  Final   Special Requests BOTTLES DRAWN AEROBIC AND ANAEROBIC  10 BLUE 5 RED  Final   Culture   Final           BLOOD CULTURE RECEIVED NO GROWTH TO DATE CULTURE WILL BE HELD FOR 5 DAYS BEFORE ISSUING A FINAL NEGATIVE REPORT Performed at [facility]    Report Status PENDING  Incomplete  Blood culture (routine x 2)     Status: None (Preliminary result)   Collection Time: 12/07/14  6:23 PM  Result Value Ref Range Status   Specimen Description BLOOD RIGHT HAND  Final   Special Requests BOTTLES DRAWN AEROBIC ONLY 5 CC  Final   Culture   Final           BLOOD CULTURE RECEIVED NO GROWTH TO DATE CULTURE WILL BE HELD FOR 5 DAYS BEFORE ISSUING A FINAL NEGATIVE REPORT Performed at [facility]    Report Status PENDING  Incomplete  Urine culture     Status: None   Collection Time: 12/07/14  6:50 PM  Result Value Ref Range Status   Specimen Description URINE, CLEAN CATCH   Final   Special Requests NONE  Final   Colony Count   Final    80,000 COLONIES/ML Performed at  [facility]   Final    Multiple bacterial morphotypes present, none predominant. Suggest appropriate recollection if clinically indicated. Performed at [facility]    Report Status 12/09/2014 FINAL  Final    Radiology Reports Dg Chest 2 View  12/07/2014   CLINICAL DATA:  Bilateral lower extremity swelling and erythema, symptoms for 4 days  EXAM: CHEST  2 VIEW  COMPARISON:  07/22/2013  FINDINGS: The heart size and mediastinal contours are within normal limits. Both lungs are clear. The visualized skeletal structures are unremarkable. The patient has had partly overlies the apices. Lungs are hypoaerated with crowding of the bronchovascular markings.  IMPRESSION: No focal abnormality, low volume exam. If symptoms persist, consider PA and lateral chest radiographs obtained at full inspiration when the patient is clinically able.   Electronically Signed   By: Christiana Pellant M.D.   On: 12/07/2014 16:35    CBC  Recent Labs Lab 12/07/14 1545 12/09/14 0540 12/11/14 0523  WBC 9.4 7.4 6.6  HGB 12.2* 11.1* 10.6*  HCT 38.3* 34.3* 33.5*  PLT 99* 118* 160  MCV 94.3 92.7 92.8  MCH 30.0 30.0 29.4  MCHC 31.9 32.4 31.6  RDW 14.2 13.8 13.7  LYMPHSABS 0.8  --   --   MONOABS 0.7  --   --   EOSABS 0.3  --   --   BASOSABS 0.0  --   --     Chemistries   Recent Labs Lab 12/07/14 1545 12/08/14 0751 12/09/14 0540 12/10/14 1329 12/10/14 1600 12/11/14 0523  NA 140 138 140 142 143 140  K 3.7 3.4* 3.9 3.7 4.1 3.8  CL 104 105 106 106 107 107  CO2 GLUCOSE 108* 116* 112* 109* 91 107*  BUN 41* 36* 33* 28* 29* 26*  CREATININE 2.77* 2.48* 2.27* 2.01* 1.86* 1.77*  CALCIUM 8.4 8.0* 8.1* 8.4 8.6 8.2*  AST 40*  --   --   --   --   --   ALT 21  --   --   --   --   --   ALKPHOS 103  --   --   --   --   --   BILITOT 1.2  --   --   --   --   --     ------------------------------------------------------------------------------------------------------------------ estimated creatinine clearance is 33.7 mL/min (by C-G formula based on Cr of 1.77). ------------------------------------------------------------------------------------------------------------------ No results for input(s): HGBA1C in the last 72 hours. ------------------------------------------------------------------------------------------------------------------ No results for input(s): CHOL, HDL, LDLCALC, TRIG, CHOLHDL, LDLDIRECT in the last 72 hours. ------------------------------------------------------------------------------------------------------------------  Recent Labs  12/09/14 0540  T3FREE 0.8*   ------------------------------------------------------------------------------------------------------------------ No results for input(s): VITAMINB12, FOLATE, FERRITIN, TIBC, IRON, RETICCTPCT in the last 72 hours.  Coagulation profile No results for input(s): INR, PROTIME in the last 168 hours.  No results for input(s): DDIMER in the last 72 hours.  Cardiac Enzymes No results for input(s): CKMB, TROPONINI, MYOGLOBIN in the last 168 hours.  Invalid input(s): CK ------------------------------------------------------------------------------------------------------------------ Invalid input(s): POCBNP    Harutyun Monteverde D.O. on 12/11/2014 at 12:19 PM  Between 7am to 7pm - Pager - [phone]  After 7pm go to [email or web address] - password [email or web address]  And look for the night coverage person covering for me after hours  [facility] Office  [phone]

## 2014-12-11 NOTE — Patient Instructions (Signed)
ANKLE: Pumps   Point toes down, then up. 10___ reps per set, _3__ sets per day, _7__ days per week   Copyright  VHI. All rights reserved.  Strengthening: Quadriceps Set   Tighten muscles on top of thighs by pushing knees down into surface. Hold __5__ seconds. Repeat _10___ times per set. Do __1__ sets per session. Do ___3_ sessions per day.  http://orth.exer.us/602   Copyright  VHI. All rights reserved.  HIP: Flexion / KNEE: Extension, Straight Leg Raise   Raise leg, keeping knee straight. Perform slowly. __10_ reps per set, __3_ sets per day, _7__ days per week   Copyright  VHI. All rights reserved.  ABDUCTION: Supine - Powder Board (Active - Assistance)   Lie on back, legs straight. Slide right/left leg out to the side as far as possible. Complete _1__ sets of 10___ repetitions. Perform _3__ sessions per day.  Copyright  VHI. All rights reserved.  HIP / KNEE: Flexion, Heel Slides - Supine   Slide heel up toward buttocks, keeping leg in straight line. 10___ reps per set, _3__ sets per day, __7_ days per week Use towel or pillowcase under heel as needed.  Copyright  VHI. All rights reserved.

## 2014-12-12 DIAGNOSIS — R5381 Other malaise: Secondary | ICD-10-CM

## 2014-12-12 LAB — GLUCOSE, CAPILLARY
GLUCOSE-CAPILLARY: 111 mg/dL — AB (ref 70–99)
Glucose-Capillary: 69 mg/dL — ABNORMAL LOW (ref 70–99)

## 2014-12-12 MED ORDER — AMOXICILLIN-POT CLAVULANATE 875-125 MG PO TABS: 1.0000 | ORAL_TABLET | Freq: Two times a day (BID) | ORAL | Status: DC

## 2014-12-12 NOTE — Discharge Summary (Signed)
Physician Discharge Summary  Joel Walker ZOX:[id number] DOB: 25-Jun-1930 DOA: 12/07/2014  PCP: Kimber Relic, MD  Admit date: 12/07/2014 Discharge date: 12/12/2014  Time spent: 45 minutes  Recommendations for Outpatient Follow-up:  Patient will be discharged with home health services, PT and RN.  Patient will need a follow-up with his primary care physician in one week discharge. Patient continue his medications as prescribed. Continue a carb modified diet.  Discharge Diagnoses:  Cellulitis of the lower extremities Acute systolic/diastolic heart failure Chronic kidney disease, stage IV Chronic atrial fibrillation Diabetes mellitus, type II Hypokalemia Hypothyroidism Mild thrombocytopenia  Discharge Condition: Stable  Diet recommendation: Carb modified  Filed Weights   12/09/14 0636 12/10/14 0700 12/11/14 0500  Weight: 87.6 kg (193 lb 2 oz) 85.9 kg (189 lb 6 oz) 86.8 kg (191 lb 5.8 oz)    History of present illness:  on 12/07/2014 by Dr. Kathlen Mody  HOBIE KOHLES is a 81 y.o. male with prior h/o hypertension, chronic systolic heart failure, cardiomyopathy, mostly in bed, has 24 hour care at home, was brought in by his family for worsening lower extremity redness and infection. He recently saw Dr Chilton Si and was given levaquin. He also underwent ABI's recently. Results are pending. On arrival to ED, he was found to have worsening bilateral lower extremity redness . He was referred to medical service for admission for lower extremity cellulitis. In addition he reports some sob, but not hypoxic. His CXR does not reveal any focal abnormality  Hospital Course:  Cellulitis of the lower extremities -Initially placed on vancomycin and rocephin -Transitioned to PO antibiotics, augmentin -ABI done outpatient showed 1.09.  -PT consulted and recommended SNF, however, patient has 24 hour care at home.  Will discharge with home health PT and RN.  -Of note, PT also recommended  ambulance transport home, however, family has opted to transport via private vehicle.  Acute Systolic/Diastolic heart failure -Echocardiogram 01/03/2013: EF 30%, grade 1 diastolic dysfunction -Continue lasix  Chronic kidney disease, Stage IV -Creatinine improving  Chronic atrial fibrillation -Currently Rate controlled.  -Continue amiodarone  Diabetes Mellitus, type 2 -Placed on  ISS and CBG monitoringduring hospitalization -Home regimen held but may continue upon discharge -hgba1c is 6.2  Hypokalemia -Resolved  Hypothyroidism -Continue synthroid  Mild thrombocytopenia -stable, resolved  Procedures: None  Consultations: None  Discharge Exam: Filed Vitals:   12/12/14 0625  BP: 153/66  Pulse: 57  Temp: 98.1 F (36.7 C)  Resp: 18   Exam  General: Well developed, well nourished  Cardiovascular: S1 S2 auscultated  Respiratory: Clear to auscultation  Abdomen: Soft, nontender, nondistended, + bowel sounds  Extremities: warm dry without cyanosis clubbing or edema in LE. Both feet currently wrapped  Discharge Instructions      Discharge Instructions    Discharge instructions    Complete by:  As directed   Patient will be discharged with home health services, PT and RN.  Patient will need a follow-up with his primary care physician in one week discharge. Patient continue his medications as prescribed. Continue a carb modified diet.            Medication List    STOP taking these medications        Tdap 5-2.5-18.5 LF-MCG/0.5 injection  Commonly known as:  BOOSTRIX      TAKE these medications        acetaminophen 500 MG tablet  Commonly known as:  TYLENOL  Take 500 mg by mouth every 6 (six) hours as  needed for moderate pain.     amiodarone 200 MG tablet  Commonly known as:  PACERONE  TAKE 1 TABLET BY MOUTH EVERY DAY     amoxicillin-clavulanate 875-125 MG per tablet  Commonly known as:  AUGMENTIN  Take 1 tablet by mouth every 12 (twelve)  hours.     aspirin EC 81 MG tablet  Take 81 mg by mouth daily.     B-D ULTRAFINE III SHORT PEN 31G X 8 MM Misc  Generic drug:  Insulin Pen Needle  CHECK UP TO 3 TIMES DAILY AS DIRECTED     donepezil 5 MG tablet  Commonly known as:  ARICEPT  TAKE 1 TABLET BY MOUTH AT BEDTIME     furosemide 20 MG tablet  Commonly known as:  LASIX  TAKE 2 TABLETS BY MOUTH EVERY DAY     glucose blood test strip  Check blood sugar once daily as instructed E11.9     Insulin Glargine 100 UNIT/ML Solostar Pen  Commonly known as:  LANTUS SOLOSTAR  Inject 5 units nightly to control diabetes     isosorbide mononitrate 30 MG 24 hr tablet  Commonly known as:  IMDUR  TAKE 1 TABLET EVERY DAY     levothyroxine 50 MCG tablet  Commonly known as:  SYNTHROID, LEVOTHROID  TAKE 1 TABLET BY MOUTH ONCE DAILY     mirtazapine 15 MG tablet  Commonly known as:  REMERON  TAKE 1 TABLET BY MOUTH AT BEDTIME TO HELP STIMULATE APPETITE     nebivolol 2.5 MG tablet  Commonly known as:  BYSTOLIC  One daily to control BP and heart rhythym     nitroGLYCERIN 0.4 MG SL tablet  Commonly known as:  NITROSTAT  Place 1 tablet (0.4 mg total) under the tongue every 5 (five) minutes as needed for chest pain.     OLANZapine 5 MG tablet  Commonly known as:  ZYPREXA  Take one tablet by mouth once daily to help nerves     omeprazole 40 MG capsule  Commonly known as:  PRILOSEC  Take 40 mg by mouth daily.     ONETOUCH DELICA LANCETS 33G Misc  Check blood sugar once daily as directed E11.9     traMADol 50 MG tablet  Commonly known as:  ULTRAM  TAKE 1 TABLET BY MOUTH EVERY 6 HOURS AS NEEDED FOR PAIN ONLY       No Known Allergies Follow-up Information    Follow up with GREEN, Lenon CurtARTHUR G, MD. Schedule an appointment as soon as possible for a visit in 1 week.   Specialty:  Internal Medicine   Why:  Hospital followup   Contact information:   [address] [place] [address] [phone]        The results of  significant diagnostics from this hospitalization (including imaging, microbiology, ancillary and laboratory) are listed below for reference.    Significant Diagnostic Studies: Dg Chest 2 View  12/07/2014   CLINICAL DATA:  Bilateral lower extremity swelling and erythema, symptoms for 4 days  EXAM: CHEST  2 VIEW  COMPARISON:  07/22/2013  FINDINGS: The heart size and mediastinal contours are within normal limits. Both lungs are clear. The visualized skeletal structures are unremarkable. The patient has had partly overlies the apices. Lungs are hypoaerated with crowding of the bronchovascular markings.  IMPRESSION: No focal abnormality, low volume exam. If symptoms persist, consider PA and lateral chest radiographs obtained at full inspiration when the patient is clinically able.   Electronically Signed  By: Christiana Pellant M.D.   On: 12/07/2014 16:35    Microbiology: Recent Results (from the past 240 hour(s))  Blood culture (routine x 2)     Status: None (Preliminary result)   Collection Time: 12/07/14  6:18 PM  Result Value Ref Range Status   Specimen Description BLOOD RIGHT ARM  Final   Special Requests BOTTLES DRAWN AEROBIC AND ANAEROBIC  10 BLUE 5 RED  Final   Culture   Final           BLOOD CULTURE RECEIVED NO GROWTH TO DATE CULTURE WILL BE HELD FOR 5 DAYS BEFORE ISSUING A FINAL NEGATIVE REPORT Performed at [facility]    Report Status PENDING  Incomplete  Blood culture (routine x 2)     Status: None (Preliminary result)   Collection Time: 12/07/14  6:23 PM  Result Value Ref Range Status   Specimen Description BLOOD RIGHT HAND  Final   Special Requests BOTTLES DRAWN AEROBIC ONLY 5 CC  Final   Culture   Final           BLOOD CULTURE RECEIVED NO GROWTH TO DATE CULTURE WILL BE HELD FOR 5 DAYS BEFORE ISSUING A FINAL NEGATIVE REPORT Performed at [facility]    Report Status PENDING  Incomplete  Urine culture     Status: None   Collection Time: 12/07/14  6:50 PM  Result  Value Ref Range Status   Specimen Description URINE, CLEAN CATCH  Final   Special Requests NONE  Final   Colony Count   Final    80,000 COLONIES/ML Performed at [facility]    Culture   Final    Multiple bacterial morphotypes present, none predominant. Suggest appropriate recollection if clinically indicated. Performed at [facility]    Report Status 12/09/2014 FINAL  Final     Labs: Basic Metabolic Panel:  Recent Labs Lab 12/08/14 0751 12/09/14 0540 12/10/14 1329 12/10/14 1600 12/11/14 0523  NA 138 140 142 143 140  K 3.4* 3.9 3.7 4.1 3.8  CL 105 106 106 107 107  CO2 GLUCOSE 116* 112* 109* 91 107*  BUN 36* 33* 28* 29* 26*  CREATININE 2.48* 2.27* 2.01* 1.86* 1.77*  CALCIUM 8.0* 8.1* 8.4 8.6 8.2*   Liver Function Tests:  Recent Labs Lab 12/07/14 1545  AST 40*  ALT 21  ALKPHOS 103  BILITOT 1.2  PROT 6.2  ALBUMIN 3.0*   No results for input(s): LIPASE, AMYLASE in the last 168 hours. No results for input(s): AMMONIA in the last 168 hours. CBC:  Recent Labs Lab 12/07/14 1545 12/09/14 0540 12/11/14 0523  WBC 9.4 7.4 6.6  NEUTROABS 7.6  --   --   HGB 12.2* 11.1* 10.6*  HCT 38.3* 34.3* 33.5*  MCV 94.3 92.7 92.8  PLT 99* 118* 160   Cardiac Enzymes: No results for input(s): CKTOTAL, CKMB, CKMBINDEX, TROPONINI in the last 168 hours. BNP: BNP (last 3 results)  Recent Labs  12/07/14 1600  BNP 456.9*    ProBNP (last 3 results) No results for input(s): PROBNP in the last 8760 hours.  CBG:  Recent Labs Lab 12/11/14 0624 12/11/14 1111 12/11/14 1617 12/11/14 2219 12/12/14 0631  GLUCAP 99 124* 147* 92 69*       Signed:  Curvin Hunger  [facility] 12/12/2014, 9:54 AM

## 2014-12-12 NOTE — Care Management Note (Signed)
CARE MANAGEMENT NOTE 12/12/2014  Patient:  Joel Walker,Joel Walker   Account Number:  [id number]  Date Initiated:  12/10/2014  Documentation initiated by:  Vance PeperBRADY,Elizabethann Lackey  Subjective/Objective Assessment:   81 yr old male admitted with bilateral feet cellulitis.     Action/Plan:   Patient's sister states he lives at home with 24hr private pay care. Care provided through [facility]. Patient will return home at discharge, will not go to SNF.  CM will continue to monitor.   Anticipated DC Date:  12/12/2014   Anticipated DC Plan:  HOME W HOME HEALTH SERVICES      DC Planning Services  CM consult      Choice offered to / List presented to:  C-5 Sibling        HH arranged  HH-1 RN  HH-2 PT      [facility]  [facility]   Status of service:  Completed, signed off Medicare Important Message given?  YES (If response is "NO", the following Medicare IM given date fields will be blank) Date Medicare IM given:  12/12/2014 Medicare IM given by:  Vance PeperBRADY,Quitman Norberto Date Additional Medicare IM given:   Additional Medicare IM given by:    Discharge Disposition:  HOME W HOME HEALTH SERVICES  Per UR Regulation:  Reviewed for med. necessity/level of care/duration of stay  If discussed at Long Length of Stay Meetings, dates discussed:    Comments:  12/12/14 1200 Vance PeperSusan Oumar Marcott, RN BSN Case Manager Cm spoke with patient's sister concerning home health needs. Choice offered. Ref[facility] called to [facility]anda, [facility]. Patient will need [facility] transport home. Patient has 24/7 private pay care providers through [facility].

## 2014-12-12 NOTE — Discharge Instructions (Signed)

## 2014-12-12 NOTE — Progress Notes (Signed)
Utilization review completed.  

## 2014-12-12 NOTE — Discharge Planning (Signed)
Patient to be discharged home with home health services and 24/7 care from a private agency. Patient's family requesting EMS ([facility]) transportation at time of discharge.  CSW met with patient's family at bedside to confirm home address as the following: [place] [facility]) has been scheduled for 12:30pm on 12/12/2014 per RN report.  Lubertha Say[place], [place] Cell: [phone]       Fax: [phone] Clinical Social Work: Orthopedics [phone]) and Surgical [phone])

## 2014-12-13 DIAGNOSIS — I129 Hypertensive chronic kidney disease with stage 1 through stage 4 chronic kidney disease, or unspecified chronic kidney disease: Secondary | ICD-10-CM | POA: Diagnosis not present

## 2014-12-13 DIAGNOSIS — L03116 Cellulitis of left lower limb: Secondary | ICD-10-CM | POA: Diagnosis not present

## 2014-12-13 DIAGNOSIS — L03115 Cellulitis of right lower limb: Secondary | ICD-10-CM | POA: Diagnosis not present

## 2014-12-13 DIAGNOSIS — I5043 Acute on chronic combined systolic (congestive) and diastolic (congestive) heart failure: Secondary | ICD-10-CM | POA: Diagnosis not present

## 2014-12-14 LAB — CULTURE, BLOOD (ROUTINE X 2)
Culture: NO GROWTH
Culture: NO GROWTH

## 2014-12-19 ENCOUNTER — Other Ambulatory Visit: Payer: Self-pay | Admitting: Internal Medicine

## 2014-12-23 ENCOUNTER — Telehealth: Payer: Self-pay | Admitting: *Deleted

## 2014-12-23 NOTE — Telephone Encounter (Signed)
May have social worker consult. Discontinue Zyprexa.

## 2014-12-23 NOTE — Telephone Encounter (Signed)
Kaitlyn with [facility] called and stated that patient has been sleeping non stop since prescribed the Zyprexa. Nurse feels like patient is giving up. She would like a verbal order for Child psychotherapistsocial worker. Please Advise.

## 2014-12-24 ENCOUNTER — Encounter: Payer: Self-pay | Admitting: Internal Medicine

## 2014-12-24 ENCOUNTER — Ambulatory Visit (INDEPENDENT_AMBULATORY_CARE_PROVIDER_SITE_OTHER): Payer: Medicare Other | Admitting: Internal Medicine

## 2014-12-24 VITALS — BP 102/50 | HR 51 | Temp 97.4°F | Resp 18

## 2014-12-24 DIAGNOSIS — R5381 Other malaise: Secondary | ICD-10-CM | POA: Diagnosis not present

## 2014-12-24 DIAGNOSIS — R410 Disorientation, unspecified: Secondary | ICD-10-CM

## 2014-12-24 DIAGNOSIS — R413 Other amnesia: Secondary | ICD-10-CM

## 2014-12-24 DIAGNOSIS — L03119 Cellulitis of unspecified part of limb: Secondary | ICD-10-CM

## 2014-12-24 DIAGNOSIS — F0391 Unspecified dementia with behavioral disturbance: Secondary | ICD-10-CM

## 2014-12-24 DIAGNOSIS — E1122 Type 2 diabetes mellitus with diabetic chronic kidney disease: Secondary | ICD-10-CM | POA: Diagnosis not present

## 2014-12-24 DIAGNOSIS — S90829A Blister (nonthermal), unspecified foot, initial encounter: Secondary | ICD-10-CM

## 2014-12-24 DIAGNOSIS — I10 Essential (primary) hypertension: Secondary | ICD-10-CM

## 2014-12-24 DIAGNOSIS — F03918 Unspecified dementia, unspecified severity, with other behavioral disturbance: Secondary | ICD-10-CM

## 2014-12-24 DIAGNOSIS — E039 Hypothyroidism, unspecified: Secondary | ICD-10-CM | POA: Diagnosis not present

## 2014-12-24 DIAGNOSIS — L602 Onychogryphosis: Secondary | ICD-10-CM | POA: Diagnosis not present

## 2014-12-24 DIAGNOSIS — N189 Chronic kidney disease, unspecified: Secondary | ICD-10-CM | POA: Diagnosis not present

## 2014-12-24 MED ORDER — TRAMADOL HCL 50 MG PO TABS: ORAL_TABLET | ORAL | Status: AC

## 2014-12-24 NOTE — Progress Notes (Signed)
Patient ID: Joel Walker, male   DOB: Mar 20, 1930, 81 y.o.   MRN: [id number]    Facility  [facility]    Place of Service:   OFFICE    No Known Allergies  Chief Complaint  Patient presents with  . Hospitalization Follow-up    feet are dark per family, bed sore on bottom,    HPI:  Hospitalized 12/07/14 through 12/12/2014. Patient had cellulitis of lower extremities. This was initially treated with vancomycin and Rocephin and then transitioned to Augmentin. He has finished this prescription.  Patient also felt to have acute systolic and diastolic heart failure. Echocardiogram done 01/03/2013 showed ejection fraction of 30% and grade 1 diastolic dysfunction.   During hospitalization creatinine improved. He has chronic kidney disease stage IV.  Diabetes mellitus was controlled  Chronic atrial fibrillation showed a controlled rate on amiodarone.  Upon return to home, Zyprexa was resumed however it seems it made him extremely drowsy. Drug was discontinued.  Staff reports a possible bed sore at buttocks.  Medications: Patient's Medications  New Prescriptions   No medications on file  Previous Medications   ACETAMINOPHEN (TYLENOL) 500 MG TABLET    Take 500 mg by mouth every 6 (six) hours as needed for moderate pain.   AMIODARONE (PACERONE) 200 MG TABLET    TAKE 1 TABLET BY MOUTH EVERY DAY   ASPIRIN EC 81 MG TABLET    Take 81 mg by mouth daily.   B-D ULTRAFINE III SHORT PEN 31G X 8 MM MISC    CHECK UP TO 3 TIMES DAILY AS DIRECTED   DONEPEZIL (ARICEPT) 5 MG TABLET    TAKE 1 TABLET BY MOUTH AT BEDTIME   FUROSEMIDE (LASIX) 20 MG TABLET    TAKE 2 TABLETS BY MOUTH EVERY DAY   GLUCOSE BLOOD TEST STRIP    Check blood sugar once daily as instructed E11.9   INSULIN GLARGINE (LANTUS SOLOSTAR) 100 UNIT/ML SOLOSTAR PEN    Inject 5 units nightly to control diabetes   ISOSORBIDE MONONITRATE (IMDUR) 30 MG 24 HR TABLET    TAKE 1 TABLET EVERY DAY   LEVOTHYROXINE (SYNTHROID, LEVOTHROID) 50 MCG TABLET     TAKE 1 TABLET BY MOUTH ONCE DAILY   MIRTAZAPINE (REMERON) 15 MG TABLET    TAKE 1 TABLET BY MOUTH AT BEDTIME TO HELP STIMULATE APPETITE   NEBIVOLOL (BYSTOLIC) 2.5 MG TABLET    One daily to control BP and heart rhythym   NITROGLYCERIN (NITROSTAT) 0.4 MG SL TABLET    Place 1 tablet (0.4 mg total) under the tongue every 5 (five) minutes as needed for chest pain.   OMEPRAZOLE (PRILOSEC) 40 MG CAPSULE    Take 40 mg by mouth daily.   ONETOUCH DELICA LANCETS 33G MISC    Check blood sugar once daily as directed E11.9   TRAMADOL (ULTRAM) 50 MG TABLET    TAKE 1 TABLET BY MOUTH EVERY 6 HOURS AS NEEDED FOR PAIN ONLY  Modified Medications   No medications on file  Discontinued Medications   AMOXICILLIN-CLAVULANATE (AUGMENTIN) 875-125 MG PER TABLET    Take 1 tablet by mouth every 12 (twelve) hours.   OLANZAPINE (ZYPREXA) 5 MG TABLET    Take one tablet by mouth once daily to help nerves     Review of Systems  Constitutional: Positive for appetite change and fatigue.  HENT: Positive for hearing loss.   Eyes: Negative.   Respiratory: Negative for cough, chest tightness and shortness of breath.   Cardiovascular: Positive for leg swelling. Negative  for chest pain and palpitations.  Gastrointestinal: Negative.   Endocrine: Negative.   Genitourinary: Negative for dysuria, urgency, decreased urine volume and enuresis.  Musculoskeletal: Positive for arthralgias.  Skin:       Healing areas of previous hemorrhagic blisters on virtually all toes. Fingernails dirty and extremely long.  Neurological: Positive for tremors and weakness. Negative for dizziness, seizures, syncope, speech difficulty and numbness.  Hematological: Negative.   Psychiatric/Behavioral: Positive for behavioral problems, confusion and agitation.       Impulsive. Independent nature.    Filed Vitals:   12/24/14 1505  BP: 102/50  Pulse: 51  Temp: 97.4 F (36.3 C)  TempSrc: Oral  Resp: 18  SpO2: 94%   There is no weight on file to  calculate BMI.  Physical Exam  Constitutional: No distress.  Lost weight.  Eyes:  Corrective lenses  Neck: Normal range of motion. Neck supple. No JVD present. No tracheal deviation present. No thyromegaly present.  Cardiovascular: Normal rate, regular rhythm and normal heart sounds.  Exam reveals no friction rub.   No murmur heard. Diminished dorsalis pedis and posterior tibial pulses bilaterally. Diminished popliteal pulses bilaterally. Bluish discoloration of the right lower leg and of several toes.  Pulmonary/Chest: Effort normal and breath sounds normal. No respiratory distress. He has no wheezes. He has no rales. He exhibits no tenderness.  Abdominal: Soft. Bowel sounds are normal. He exhibits no distension and no mass. There is no tenderness.  Musculoskeletal: He exhibits edema. He exhibits no tenderness.  Generalized pain and pain in the right knee.  Lymphadenopathy:    He has no cervical adenopathy.  Neurological: He is alert. No cranial nerve deficit. Coordination abnormal.  Very unsteady when standing. Diminished sensation to vibration and monofilament testing. Significant memory loss.  Skin: Skin is warm and dry. No rash noted. No erythema. No pallor.  Lipoma right lateral chest wall.  Hemorrhagic blisters of virtually all toes of both feet are healing. No decubiti.  Psychiatric:   Impulsive. Poor judgment. Patient is calm and compliant at this time. Recent reports indicate that he had been quite agitated and Zyprexa was prescribed with beneficial results.     Labs reviewed: Admission on 12/07/2014, Discharged on 12/12/2014  No results displayed because visit has over 200 results.    Appointment on 10/03/2014  Component Date Value Ref Range Status  . TSH 10/03/2014 3.410  0.450 - 4.500 uIU/mL Final  . Glucose 10/03/2014 131* 65 - 99 mg/dL Final  . BUN 16/10/960401/28/2016 26  8 - 27 mg/dL Final  . Creatinine, Ser 10/03/2014 2.12* 0.76 - 1.27 mg/dL Final  . GFR calc non Af  Amer 10/03/2014 28* >59 mL/min/1.73 Final  . GFR calc Af Amer 10/03/2014 32* >59 mL/min/1.73 Final  . BUN/Creatinine Ratio 10/03/2014 12  10 - 22 Final  . Sodium 10/03/2014 140  134 - 144 mmol/L Final  . Potassium 10/03/2014 4.2  3.5 - 5.2 mmol/L Final  . Chloride 10/03/2014 99  97 - 108 mmol/L Final  . CO2 10/03/2014 23  18 - 29 mmol/L Final  . Calcium 10/03/2014 9.0  8.6 - 10.2 mg/dL Final  . Cholesterol, Total 10/03/2014 229* 100 - 199 mg/dL Final  . Triglycerides 10/03/2014 205* 0 - 149 mg/dL Final  . HDL 54/09/811901/28/2016 37* >39 mg/dL Final   Comment: According to ATP-III Guidelines, HDL-C >59 mg/dL is considered a negative risk factor for CHD.   Marland Kitchen. VLDL Cholesterol Cal 10/03/2014 41* 5 - 40 mg/dL Final  . LDL  Calculated 10/03/2014 151* 0 - 99 mg/dL Final  . Chol/HDL Ratio 10/03/2014 6.2* 0.0 - 5.0 ratio units Final   Comment:                                   T. Chol/HDL Ratio                                             Men  Women                               1/2 Avg.Risk  3.4    3.3                                   Avg.Risk  5.0    4.4                                2X Avg.Risk  9.6    7.1                                3X Avg.Risk 23.4   11.0   . Microalbum.,U,Random 10/08/2014 55.1* 0.0 - 17.0 ug/mL Final  . Hgb A1c MFr Bld 10/03/2014 6.2* 4.8 - 5.6 % Final   Comment:          Pre-diabetes: 5.7 - 6.4          Diabetes: >6.4          Glycemic control for adults with diabetes: <7.0   . Est. average glucose Bld gHb Est-m* 10/03/2014 131   Final     Assessment/Plan  1. Onychogryphosis I trimmed his fingernails on both hands in the course of this office visit  2. Blister of foot, unspecified laterality, initial encounter Healing. Continue Xeroform gauze and Kerlix bandages.  3. Cellulitis of lower extremity, unspecified laterality Improved  4. Confusion with non-focal neuro exam Continues to have significant problems with dementia. Delirium seemed to be present without  his hospital stay has cleared.  5. Debility - Comprehensive metabolic panel; Future  6. Dementia with behavioral disturbance Zyprexa has been used to help with behavioral disturbance in the past. It appears that he can do without this at this time. Family is holding his medication.  7. Type 2 diabetes mellitus with diabetic chronic kidney disease Continue current medications - Comprehensive metabolic panel; Future - Hemoglobin A1c; Future  8. Memory loss Progressive dementia  9. Hypothyroidism, unspecified hypothyroidism type - TSH; Future  10. Essential hypertension - Comprehensive metabolic panel; Future - EKG 12-Lead; Future

## 2014-12-24 NOTE — Telephone Encounter (Signed)
[facility] to return call.

## 2014-12-24 NOTE — Telephone Encounter (Signed)
Kaitlyn with [facility] notified and agreed.

## 2014-12-27 ENCOUNTER — Telehealth: Payer: Self-pay

## 2014-12-27 NOTE — Telephone Encounter (Signed)
Patient was told to call if symptoms do not improve.  Patient still sleeping a lot and with SOB when walking.   Last OV 12/24/14 (Tuesday), please advise

## 2014-12-29 ENCOUNTER — Emergency Department (HOSPITAL_COMMUNITY)
Admission: EM | Admit: 2014-12-29 | Discharge: 2014-12-29 | Disposition: A | Discharge status: Home / Self Care | Payer: Medicare Other | Attending: Emergency Medicine | Admitting: Emergency Medicine

## 2014-12-29 ENCOUNTER — Encounter (HOSPITAL_COMMUNITY): Payer: Self-pay | Admitting: *Deleted

## 2014-12-29 ENCOUNTER — Emergency Department (HOSPITAL_COMMUNITY): Payer: Medicare Other

## 2014-12-29 DIAGNOSIS — N183 Chronic kidney disease, stage 3 (moderate): Secondary | ICD-10-CM | POA: Diagnosis not present

## 2014-12-29 DIAGNOSIS — I2510 Atherosclerotic heart disease of native coronary artery without angina pectoris: Secondary | ICD-10-CM | POA: Insufficient documentation

## 2014-12-29 DIAGNOSIS — Z792 Long term (current) use of antibiotics: Secondary | ICD-10-CM | POA: Insufficient documentation

## 2014-12-29 DIAGNOSIS — E039 Hypothyroidism, unspecified: Secondary | ICD-10-CM | POA: Diagnosis not present

## 2014-12-29 DIAGNOSIS — I129 Hypertensive chronic kidney disease with stage 1 through stage 4 chronic kidney disease, or unspecified chronic kidney disease: Secondary | ICD-10-CM | POA: Diagnosis not present

## 2014-12-29 DIAGNOSIS — Z79899 Other long term (current) drug therapy: Secondary | ICD-10-CM | POA: Insufficient documentation

## 2014-12-29 DIAGNOSIS — K219 Gastro-esophageal reflux disease without esophagitis: Secondary | ICD-10-CM | POA: Diagnosis not present

## 2014-12-29 DIAGNOSIS — Z9889 Other specified postprocedural states: Secondary | ICD-10-CM | POA: Diagnosis not present

## 2014-12-29 DIAGNOSIS — E119 Type 2 diabetes mellitus without complications: Secondary | ICD-10-CM | POA: Diagnosis not present

## 2014-12-29 DIAGNOSIS — R0602 Shortness of breath: Secondary | ICD-10-CM | POA: Diagnosis present

## 2014-12-29 DIAGNOSIS — F0390 Unspecified dementia without behavioral disturbance: Secondary | ICD-10-CM | POA: Diagnosis not present

## 2014-12-29 DIAGNOSIS — Z862 Personal history of diseases of the blood and blood-forming organs and certain disorders involving the immune mechanism: Secondary | ICD-10-CM | POA: Insufficient documentation

## 2014-12-29 DIAGNOSIS — Z7982 Long term (current) use of aspirin: Secondary | ICD-10-CM | POA: Diagnosis not present

## 2014-12-29 DIAGNOSIS — Z87891 Personal history of nicotine dependence: Secondary | ICD-10-CM | POA: Diagnosis not present

## 2014-12-29 DIAGNOSIS — Z872 Personal history of diseases of the skin and subcutaneous tissue: Secondary | ICD-10-CM | POA: Diagnosis not present

## 2014-12-29 DIAGNOSIS — I5021 Acute systolic (congestive) heart failure: Secondary | ICD-10-CM | POA: Insufficient documentation

## 2014-12-29 DIAGNOSIS — Z9861 Coronary angioplasty status: Secondary | ICD-10-CM | POA: Diagnosis not present

## 2014-12-29 DIAGNOSIS — Z86718 Personal history of other venous thrombosis and embolism: Secondary | ICD-10-CM | POA: Diagnosis not present

## 2014-12-29 DIAGNOSIS — H9190 Unspecified hearing loss, unspecified ear: Secondary | ICD-10-CM | POA: Diagnosis not present

## 2014-12-29 LAB — I-STAT CHEM 8, ED
BUN: 29 mg/dL — ABNORMAL HIGH (ref 6–23)
CHLORIDE: 100 mmol/L (ref 96–112)
Calcium, Ion: 1.14 mmol/L (ref 1.13–1.30)
Creatinine, Ser: 2.2 mg/dL — ABNORMAL HIGH (ref 0.50–1.35)
GLUCOSE: 164 mg/dL — AB (ref 70–99)
HCT: 43 % (ref 39.0–52.0)
HEMOGLOBIN: 14.6 g/dL (ref 13.0–17.0)
POTASSIUM: 4.1 mmol/L (ref 3.5–5.1)
SODIUM: 140 mmol/L (ref 135–145)
TCO2: 27 mmol/L (ref 0–100)

## 2014-12-29 LAB — CBC WITH DIFFERENTIAL/PLATELET
BASOS ABS: 0 10*3/uL (ref 0.0–0.1)
Basophils Relative: 0 % (ref 0–1)
Eosinophils Absolute: 0.2 10*3/uL (ref 0.0–0.7)
Eosinophils Relative: 2 % (ref 0–5)
HEMATOCRIT: 41.4 % (ref 39.0–52.0)
Hemoglobin: 13.1 g/dL (ref 13.0–17.0)
Lymphocytes Relative: 20 % (ref 12–46)
Lymphs Abs: 2.3 10*3/uL (ref 0.7–4.0)
MCH: 30.3 pg (ref 26.0–34.0)
MCHC: 31.6 g/dL (ref 30.0–36.0)
MCV: 95.6 fL (ref 78.0–100.0)
Monocytes Absolute: 1 10*3/uL (ref 0.1–1.0)
Monocytes Relative: 9 % (ref 3–12)
NEUTROS ABS: 7.8 10*3/uL — AB (ref 1.7–7.7)
Neutrophils Relative %: 69 % (ref 43–77)
Platelets: 139 10*3/uL — ABNORMAL LOW (ref 150–400)
RBC: 4.33 MIL/uL (ref 4.22–5.81)
RDW: 14.1 % (ref 11.5–15.5)
WBC: 11.4 10*3/uL — AB (ref 4.0–10.5)

## 2014-12-29 LAB — I-STAT TROPONIN, ED: TROPONIN I, POC: 0.01 ng/mL (ref 0.00–0.08)

## 2014-12-29 LAB — BRAIN NATRIURETIC PEPTIDE: B Natriuretic Peptide: 215.6 pg/mL — ABNORMAL HIGH (ref 0.0–100.0)

## 2014-12-29 MED ORDER — ALBUTEROL SULFATE (2.5 MG/3ML) 0.083% IN NEBU: 2.5000 mg | INHALATION_SOLUTION | Freq: Four times a day (QID) | RESPIRATORY_TRACT | Status: DC | PRN

## 2014-12-29 NOTE — ED Notes (Signed)
Pt was SOB and EMS reported o2 sat of 71% with cyanosis/pale and using accessory mucles.  EMS gave pt 5mg  albuterol treatment that improved pt breathing and SATS to 100%.  Pt has no c/o pain and is breathing wnl.

## 2014-12-29 NOTE — ED Notes (Signed)
Tech fixing monitor. In order to do that he must hook up monitor to simulator to which will give false reading. For patient. At the time. I said go ahead we need it fixed.

## 2014-12-29 NOTE — Discharge Instructions (Signed)
Bronchospasm A bronchospasm is when the tubes that carry air in and out of your lungs (airways) spasm or tighten. During a bronchospasm it is hard to breathe. This is because the airways get smaller. A bronchospasm can be triggered by:  Allergies. These may be to animals, pollen, food, or mold.  Infection. This is a common cause of bronchospasm.  Exercise.  Irritants. These include pollution, cigarette smoke, strong odors, aerosol sprays, and paint fumes.  Weather changes.  Stress.  Being emotional. HOME CARE   Always have a plan for getting help. Know when to call your doctor and local emergency services (911 in the [place] Know where you can get emergency care.  Only take medicines as told by your doctor.  If you were prescribed an inhaler or nebulizer machine, ask your doctor how to use it correctly. Always use a spacer with your inhaler if you were given one.  Stay calm during an attack. Try to relax and breathe more slowly.  Control your home environment:  Change your heating and air conditioning filter at least once a month.  Limit your use of fireplaces and wood stoves.  Do not  smoke. Do not  allow smoking in your home.  Avoid perfumes and fragrances.  Get rid of pests (such as roaches and mice) and their droppings.  Throw away plants if you see mold on them.  Keep your house clean and dust free.  Replace carpet with wood, tile, or vinyl flooring. Carpet can trap dander and dust.  Use allergy-proof pillows, mattress covers, and box spring covers.  Wash bed sheets and blankets every week in hot water. Dry them in a dryer.  Use blankets that are made of polyester or cotton.  Wash hands frequently. GET HELP IF:  You have muscle aches.  You have chest pain.  The thick spit you spit or cough up (sputum) changes from clear or white to yellow, green, gray, or bloody.  The thick spit you spit or cough up gets thicker.  There are problems that may be related  to the medicine you are given such as:  A rash.  Itching.  Swelling.  Trouble breathing. GET HELP RIGHT AWAY IF:  You feel you cannot breathe or catch your breath.  You cannot stop coughing.  Your treatment is not helping you breathe better.  You have very bad chest pain. MAKE SURE YOU:   Understand these instructions.  Will watch your condition.  Will get help right away if you are not doing well or get worse. Document Released: 06/20/2009 Document Revised: 08/28/2013 Document Reviewed: 02/13/2013 Exit[employer] Patient Information 2015 [employer]. This information is not intended to replace advice given to you by your health care provider. Make sure you discuss any questions you have with your health care provider.  

## 2014-12-29 NOTE — ED Notes (Signed)
Pt returned from xray

## 2014-12-29 NOTE — ED Notes (Signed)
Off unit with xray. 

## 2014-12-29 NOTE — ED Provider Notes (Signed)
CSN: [id number]     Arrival date & time 12/29/14  1309 History   First MD Initiated Contact with Patient 12/29/14 1335     Chief Complaint  Patient presents with  . Shortness of Breath      HPI Pt was SOB and EMS reported o2 sat of 71% with cyanosis/pale and using accessory mucles. EMS gave pt 5mg  albuterol treatment that improved pt breathing and SATS to 100%. Pt has no c/o pain and is breathing wnl. Past Medical History  Diagnosis Date  . SYSTOLIC HEART FAILURE, ACUTE   . PULMONARY HYPERTENSION   . PAROXYSMAL ATRIAL FIBRILLATION   . HYPERTENSION   . DVT   . CARDIOMYOPATHY   . CAD, NATIVE VESSEL   . TOBACCO USE, QUIT   . RENAL DISEASE, CHRONIC, STAGE III   . MURAL THROMBUS, APEX OF HEART   . Long term current use of anticoagulant   . HYPOKALEMIA   . DM   . ANEMIA, IRON DEFICIENCY   . CHF (congestive heart failure)   . Senile cataract, unspecified   . Other specified disease of nail   . Unspecified hearing loss   . Chronic kidney disease, stage II (mild)   . Memory loss   . Unspecified urinary incontinence   . Abnormality of gait   . Unspecified hypothyroidism   . Unspecified vitamin D deficiency   . Anemia, unspecified   . Coronary atherosclerosis of native coronary artery   . Acute systolic heart failure   . Primary pulmonary hypertension   . Cardiomyopathy in other diseases classified elsewhere   . Other seborrheic dermatitis   . Other and unspecified angina pectoris   . Other and unspecified hyperlipidemia   . Slowing of urinary stream   . Type II or unspecified type diabetes mellitus without mention of complication, not stated as uncontrolled   . Unspecified essential hypertension   . Esophageal reflux   . Edema   . Shortness of breath   . Hydrocele, unspecified   . Impotence of organic origin   . Edema    Past Surgical History  Procedure Laterality Date  . Cholecystectomy  1985  . Placement of a veriflex bare-metal sten    . Coronary angioplasty with  stent placement      VeriFLEX bare-metal stent  . Right heart cardiac catherterization    . Coronary anteriogram  05/2009  . Doppler echocardiography  05/2009   Family History  Problem Relation Age of Onset  . Diabetes Mother   . Stroke Father   . Cancer Sister    History  Substance Use Topics  . Smoking status: Former Smoker -- 1.00 packs/day for 50 years    Types: Cigarettes    Quit date: 09/06/1998  . Smokeless tobacco: Never Used     Comment: 12/06/2012 "smoked from 1950 to 2000"  . Alcohol Use: No    Review of Systems  Unable to perform ROS: Dementia      Allergies  Review of patient's allergies indicates no known allergies.  Home Medications   Prior to Admission medications   Medication Sig Start Date End Date Taking? Authorizing Provider  acetaminophen (TYLENOL) 500 MG tablet Take 500 mg by mouth every 6 (six) hours as needed for moderate pain.   Yes Historical Provider, MD  amiodarone (PACERONE) 200 MG tablet TAKE 1 TABLET BY MOUTH EVERY DAY 11/25/14  Yes Kimber RelicArthur G Green, MD  amoxicillin-clavulanate (AUGMENTIN) 875-125 MG per tablet Take 1 tablet by mouth 2 (two) times  daily. Last time was given on 12-12-14  Unknown start date   Yes Historical Provider, MD  aspirin EC 81 MG tablet Take 81 mg by mouth daily.   Yes Historical Provider, MD  donepezil (ARICEPT) 5 MG tablet TAKE 1 TABLET BY MOUTH AT BEDTIME 12/19/14  Yes Kimber Relic, MD  furosemide (LASIX) 20 MG tablet TAKE 2 TABLETS BY MOUTH EVERY DAY 09/30/14  Yes Tiffany L Reed, DO  Insulin Glargine (LANTUS SOLOSTAR) 100 UNIT/ML Solostar Pen Inject 5 units nightly to control diabetes Patient taking differently: Inject 5 Units into the skin at bedtime. Inject 5 units nightly to control diabetes 02/13/14  Yes Kimber Relic, MD  isosorbide mononitrate (IMDUR) 30 MG 24 hr tablet TAKE 1 TABLET EVERY DAY 09/09/14  Yes Sharon Seller, NP  levothyroxine (SYNTHROID, LEVOTHROID) 50 MCG tablet TAKE 1 TABLET BY MOUTH ONCE DAILY  11/21/13  Yes Sharon Seller, NP  mirtazapine (REMERON) 15 MG tablet TAKE 1 TABLET BY MOUTH AT BEDTIME TO HELP STIMULATE APPETITE   Yes Kimber Relic, MD  nebivolol (BYSTOLIC) 2.5 MG tablet One daily to control BP and heart rhythym 02/13/14  Yes Kimber Relic, MD  nitroGLYCERIN (NITROSTAT) 0.4 MG SL tablet Place 1 tablet (0.4 mg total) under the tongue every 5 (five) minutes as needed for chest pain. 02/28/13  Yes Kimber Relic, MD  OLANZapine (ZYPREXA) 5 MG tablet Take 5 mg by mouth daily.  11/27/14  Yes Historical Provider, MD  omeprazole (PRILOSEC) 40 MG capsule Take 40 mg by mouth daily.   Yes Historical Provider, MD  traMADol (ULTRAM) 50 MG tablet TAKE 1 TABLET BY MOUTH EVERY 6 HOURS AS NEEDED FOR PAIN 12/24/14  Yes Kimber Relic, MD  albuterol (PROVENTIL) (2.5 MG/3ML) 0.083% nebulizer solution Take 3 mLs (2.5 mg total) by nebulization every 6 (six) hours as needed for wheezing or shortness of breath. 12/29/14   Nelva Nay, MD  B-D ULTRAFINE III SHORT PEN 31G X 8 MM MISC CHECK UP TO 3 TIMES DAILY AS DIRECTED 05/27/14   Tiffany L Reed, DO  glucose blood test strip Check blood sugar once daily as instructed E11.9 10/08/14   Kimber Relic, MD  [facility] DELICA LANCETS 33G MISC Check blood sugar once daily as directed E11.9 10/08/14   Kimber Relic, MD   BP 134/53 mmHg  Pulse 55  Temp(Src) 98.4 F (36.9 C) (Oral)  Resp 15  SpO2 94% Physical Exam  Constitutional: He appears well-developed and well-nourished. No distress.  HENT:  Head: Normocephalic and atraumatic.  Eyes: Pupils are equal, round, and reactive to light.  Neck: Normal range of motion.  Cardiovascular: Intact distal pulses.   Pulmonary/Chest: No respiratory distress. He has no wheezes. He has no rales.  Abdominal: Normal appearance. He exhibits no distension. There is no tenderness. There is no rebound.  Musculoskeletal: Normal range of motion. He exhibits no edema or tenderness.  Neurological: He is alert. No cranial nerve  deficit.  Skin: Skin is warm and dry. No rash noted.  Nursing note and vitals reviewed.   ED Course  Procedures (including critical care time) Labs Review Labs Reviewed  BRAIN NATRIURETIC PEPTIDE - Abnormal; Notable for the following:    B Natriuretic Peptide 215.6 (*)    All other components within normal limits  CBC WITH DIFFERENTIAL/PLATELET - Abnormal; Notable for the following:    WBC 11.4 (*)    Platelets 139 (*)    Neutro Abs 7.8 (*)  All other components within normal limits  I-STAT CHEM 8, ED - Abnormal; Notable for the following:    BUN 29 (*)    Creatinine, Ser 2.20 (*)    Glucose, Bld 164 (*)    All other components within normal limits  I-STAT TROPOININ, ED    Imaging Review No results found.   EKG Interpretation   Date/Time:  Sunday December 29 2014 13:25:44 EDT Ventricular Rate:  51 PR Interval:  310 QRS Duration: 116 QT Interval:  516 QTC Calculation: 475 R Axis:   -24 Text Interpretation:  Sinus bradycardia with 1st degree A-V block Inferior  infarct , age undetermined Anteroseptal infarct , age undetermined  Abnormal ECG No significant change since last tracing Confirmed by Kesean Serviss   MD, Aalyah Mansouri [id number]) on 12/29/2014 2:35:48 PM     Patient observed in the emergency room room no more wheezing.  His pulse oximetry remained at 95-100% on room air.  Will discharge with albuterol for nebulization and instructions to call his family doctor to arrange home health to set up for nebulization possibly at home. MDM   Final diagnoses:  SOB (shortness of breath)        Nelva Nay, MD 01/01/15 1014

## 2014-12-29 NOTE — ED Notes (Signed)
Pt becoming agitated and pulling off his leads, refusing to let staff put back on.

## 2014-12-29 NOTE — ED Notes (Signed)
Attempted report 

## 2014-12-29 NOTE — ED Notes (Signed)
[place], MD aware of abnormal lab test results 

## 2014-12-29 NOTE — ED Notes (Signed)
Pt C/O SOB x 2 days. EMS states he was o2 was 71% when they got to his home.  EMS stated he was pale and blue around his lips. EMS gave 5 mg albuteral that helped per pt.

## 2014-12-30 NOTE — Telephone Encounter (Signed)
Called [facility], unable to leave message, voicemail box not set up. I will try again later

## 2014-12-30 NOTE — Telephone Encounter (Signed)
Rinaldo Cloudamela called and left voicemail message to return call regarding message left earlier and left # [phone]. Tried calling and voicemail not set up to receive messages.

## 2014-12-30 NOTE — Telephone Encounter (Signed)
Patient was seen in the emergency room 12/29/2014. He had lab work and a chest x-ray. He was started on albuterol. He should continue this medication. Call and see if he is doing better. If not, we need to see him within the next week. If better, return as planned in July.

## 2014-12-31 ENCOUNTER — Telehealth: Payer: Self-pay | Admitting: *Deleted

## 2014-12-31 NOTE — Telephone Encounter (Signed)
Patient caregiver called and stated that patient was recently in the hospital and released with Nebulizer medication, but they do not have a nebulizer. Can something else be prescribed like a inhaler? And wants to know do you want to see him sooner than his scheduled appointment (7/5)? Please Advise.

## 2015-01-01 MED ORDER — ALBUTEROL SULFATE HFA 108 (90 BASE) MCG/ACT IN AERS: INHALATION_SPRAY | RESPIRATORY_TRACT | Status: AC

## 2015-01-01 NOTE — Telephone Encounter (Signed)
[facility] Notified and Inhaler faxed to pharmacy.

## 2015-01-01 NOTE — Telephone Encounter (Signed)
Albuterol HFA Disp: 1 inhaler Sig: Inhale 2 puffs every 4 hours if needed for dyspnea or wheezing.

## 2015-01-13 ENCOUNTER — Other Ambulatory Visit: Payer: Self-pay | Admitting: Internal Medicine

## 2015-01-13 ENCOUNTER — Telehealth: Payer: Self-pay | Admitting: *Deleted

## 2015-01-13 NOTE — Telephone Encounter (Signed)
Rinaldo Cloudamela, [facility] called and stated that they want patient to go back on pills instead of insulin. They will not have anyone available to give Insulin shots so they want to go back to taking pills instead. Please Advise.

## 2015-01-13 NOTE — Telephone Encounter (Signed)
Discontinue insulin. Start metformin 1000 mg each morning. Dispense 30. Refill 5. This does not have to be the long-acting form of metformin.

## 2015-01-13 NOTE — Telephone Encounter (Signed)
Tried calling caregiver and voicemail not set up, could not leave message to return call. Will try again  later.

## 2015-01-14 MED ORDER — METFORMIN HCL 1000 MG PO TABS: ORAL_TABLET | ORAL | Status: AC

## 2015-01-14 NOTE — Telephone Encounter (Signed)
Patient caregiver notified and Rx faxed to pharmacy.

## 2015-01-20 ENCOUNTER — Other Ambulatory Visit: Payer: Self-pay | Admitting: Internal Medicine

## 2015-01-22 NOTE — Telephone Encounter (Signed)
Patient with pending appointment in July, will call if needs appointment prior to July 2016

## 2015-02-17 ENCOUNTER — Other Ambulatory Visit: Payer: Self-pay | Admitting: Internal Medicine

## 2015-03-01 ENCOUNTER — Emergency Department (HOSPITAL_COMMUNITY)
Admission: EM | Admit: 2015-03-01 | Discharge: 2015-03-07 | Disposition: E | Payer: Medicare Other | Attending: Emergency Medicine | Admitting: Emergency Medicine

## 2015-03-01 DIAGNOSIS — I469 Cardiac arrest, cause unspecified: Secondary | ICD-10-CM | POA: Insufficient documentation

## 2015-03-01 MED ORDER — EPINEPHRINE HCL 0.1 MG/ML IJ SOSY
PREFILLED_SYRINGE | INTRAMUSCULAR | Status: AC | PRN
  Administered 2015-03-01 (×2): 1 via INTRAVENOUS

## 2015-03-01 MED ORDER — SODIUM BICARBONATE 8.4 % IV SOLN
INTRAVENOUS | Status: AC | PRN
  Administered 2015-03-01: 50 meq via INTRAVENOUS

## 2015-03-03 MED FILL — Medication: Qty: 1 | Status: AC

## 2015-03-07 NOTE — Code Documentation (Signed)
Pulse check, cardiac stand still, no pulse, monitor asytole

## 2015-03-07 NOTE — Code Documentation (Signed)
Pulse check, PEA no pulse palpable  CPR resumed

## 2015-03-07 NOTE — ED Notes (Signed)
Trauma start documented in error

## 2015-03-07 NOTE — Code Documentation (Signed)
Family at beside. Family given emotional support. 

## 2015-03-07 NOTE — ED Provider Notes (Signed)
CSN: [id number]     Arrival date & time 2015/03/02  0143 History   This chart was scribed for Azalia Bilis, MD by Merlene Laughter, ED Scribe. This patient was seen in room [facility] and the patient's care was started at 1:42 AM.     Chief Complaint  Patient presents with  . Cardiac Arrest   LEVEL 5 CAVEAT: unresponsive  The history is provided by the EMS personnel. No language interpreter was used.    HPI Comments: Joel Walker is a 81 y.o. male brought in by EMS, who presents to the Emergency Department complaining of an initial syncopal episode.  Per EMS, patient fell in bathroom, regained consciousness and was combative.  CPR was initiated 30 min PTA.  Patient had no return of spontaneous circulation in route. CPR in progress on arrival. Per EMS, patient was given 6 epinephrine, narcan, and was PEA on monitor.  Patient has PMHx of DM.   No past medical history on file. No past surgical history on file. No family history on file. History  Substance Use Topics  . Smoking status: Not on file  . Smokeless tobacco: Not on file  . Alcohol Use: Not on file    Review of Systems  Unable to perform ROS: Mental status change    LEVEL 5 CAVEAT: ALTERED MENTAL STATUS  Allergies  Review of patient's allergies indicates not on file.  Home Medications   Prior to Admission medications   Not on File   BP 0/0 mmHg  Pulse 0 Physical Exam  Constitutional: He appears well-developed and well-nourished.  HENT:  Head: Atraumatic.  King airway in place  Eyes:  Fixed and dilated pupils  Cardiovascular:  No heart sounds. Pulses present on CPR only  Pulmonary/Chest:  Breath sounds present bilaterally with tube bag ventilations.   Abdominal: He exhibits no distension.  Genitourinary:  Normal gross appearance of the genitalia  Musculoskeletal: He exhibits no edema.  Neurological:  GCS3  Nursing note and vitals reviewed.   ED Course  Procedures   Cardiopulmonary Resuscitation  (CPR) Procedure Note Directed/Performed by: Lyanne Co I personally directed ancillary staff and/or performed CPR in an effort to regain return of spontaneous circulation and to maintain cardiac, neuro and systemic perfusion.      EMERGENCY DEPARTMENT [place] CARDIAC EXAM "Study: Limited Ultrasound of the heart and pericardium" INDICATIONS:Cardiac arrest Multiple views of the heart and pericardium are obtained with a multi-frequency probe. PERFORMED WU:[id number] IMAGES ARCHIVED?: Yes FINDINGS: No pericardial effusion and No cardiac activity LIMITATIONS:  Emergent procedure VIEWS USED: Subcostal 4 chamber INTERPRETATION: Cardiac activity absent COMMENT:   DIAGNOSTIC STUDIES:    COORDINATION OF CARE:  1:49 AM - Time of death 1:49 AM   Labs Review Labs Reviewed - No data to display  Imaging Review No results found.   EKG Interpretation None      MDM   Final diagnoses:  None   cpr present on arrival. Standard ACLS medication and CPR. No return of spontaeous circulation. Code called.  Time of death 30am. Family updated. pcp to sign the death certificate  I personally performed the services described in this documentation, which was scribed in my presence. The recorded information has been reviewed and is accurate.       Azalia Bilis, MD March 02, 2015 [phone]

## 2015-03-07 NOTE — ED Notes (Signed)
See Code narrator

## 2015-03-07 NOTE — Code Documentation (Signed)
CPR d'cd per Dr. Patria Mane

## 2015-03-07 NOTE — Code Documentation (Signed)
Organ procurement team notified.

## 2015-03-07 NOTE — Code Documentation (Signed)
EMS reports family st's pt had a syncopal episode at home then became combative before going unresponsive.  Pt was given Epi x's 6 and Narcan 4mg  via IO.  CPR was started by EMS at 01:15

## 2015-03-07 NOTE — Code Documentation (Signed)
Patient time of death occurred at 01:49.

## 2015-03-07 DEATH — deceased

## 2015-03-11 ENCOUNTER — Ambulatory Visit: Payer: Self-pay | Admitting: Internal Medicine

## 2015-03-11 ENCOUNTER — Other Ambulatory Visit: Payer: Self-pay | Admitting: Nurse Practitioner

## 2015-03-11 ENCOUNTER — Other Ambulatory Visit: Payer: Self-pay | Admitting: Internal Medicine

## 2015-03-29 ENCOUNTER — Other Ambulatory Visit: Payer: Self-pay | Admitting: Internal Medicine

## 2015-04-01 ENCOUNTER — Ambulatory Visit: Payer: Self-pay | Admitting: Internal Medicine

## 2015-04-12 ENCOUNTER — Other Ambulatory Visit: Payer: Self-pay | Admitting: Nurse Practitioner

## 2015-11-11 IMAGING — DX DG CHEST 2V
2 series · 2 of 2 positions shown · non-contrast
Comparison: 12/07/2014

CLINICAL DATA: Shortness of breath

EXAM:
CHEST  2 VIEW

[chest ap]
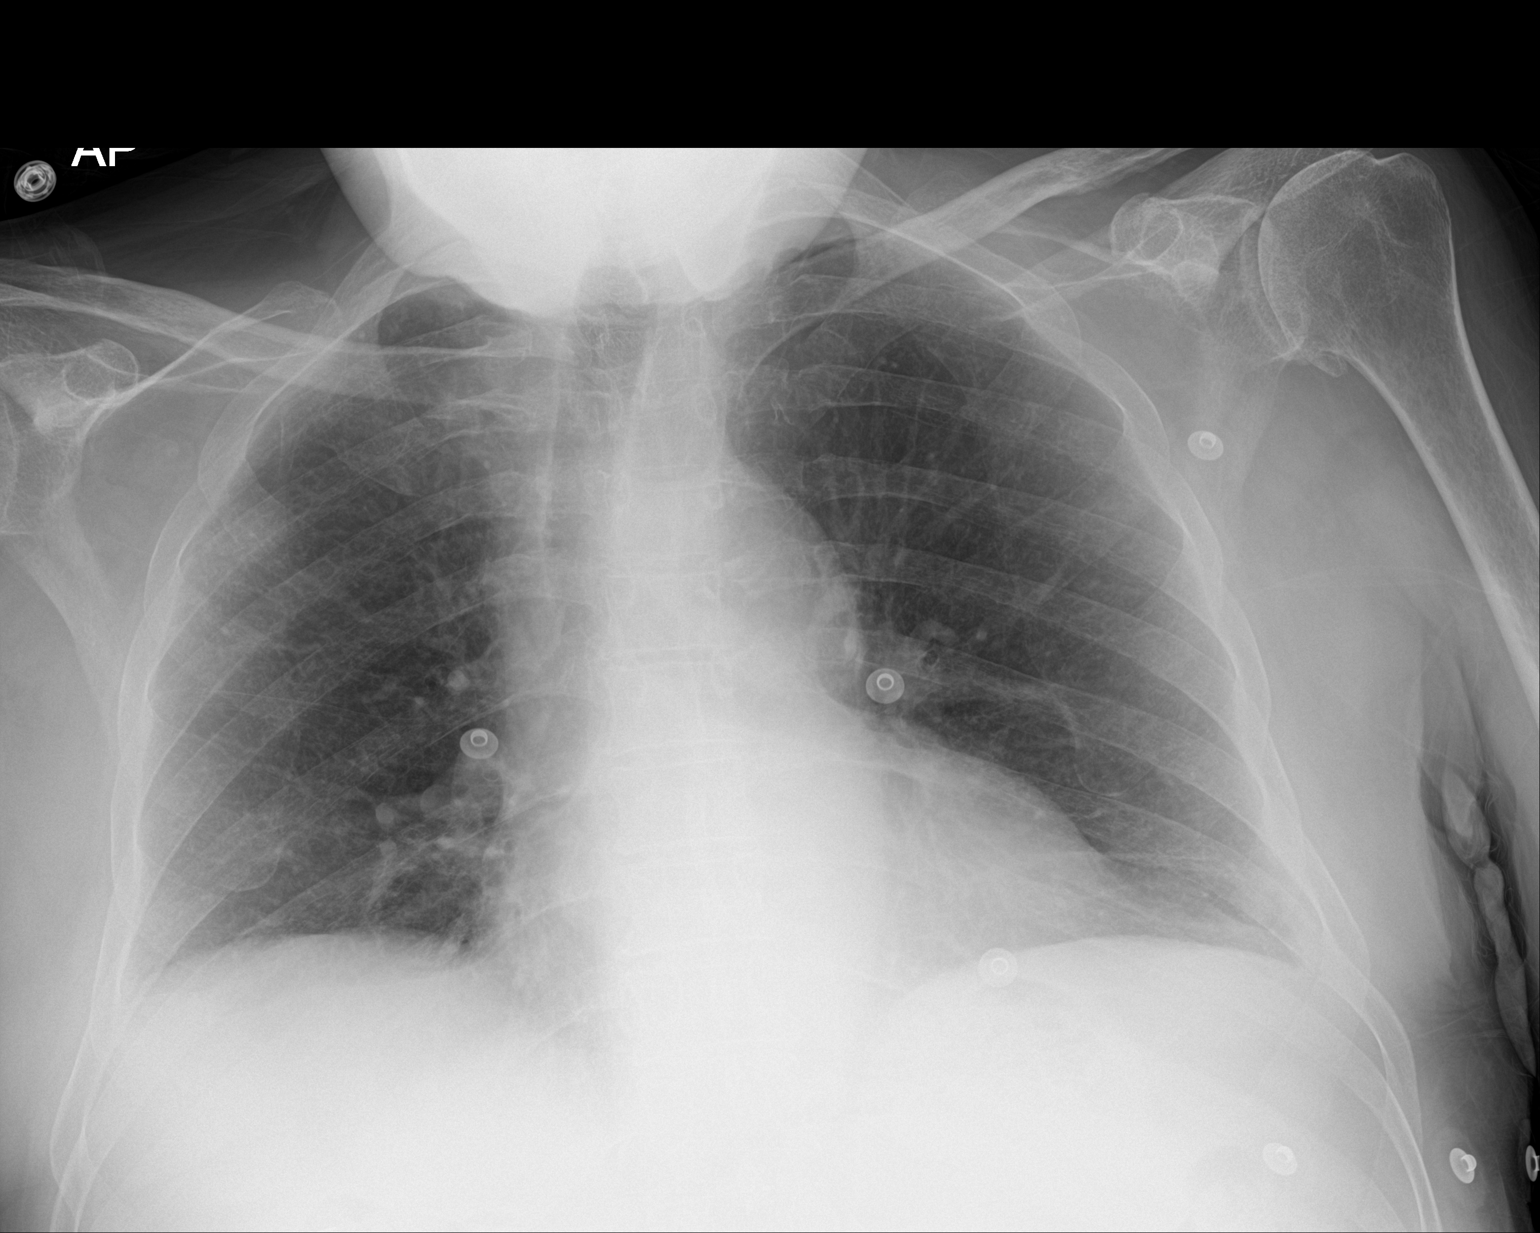

[chest lat]
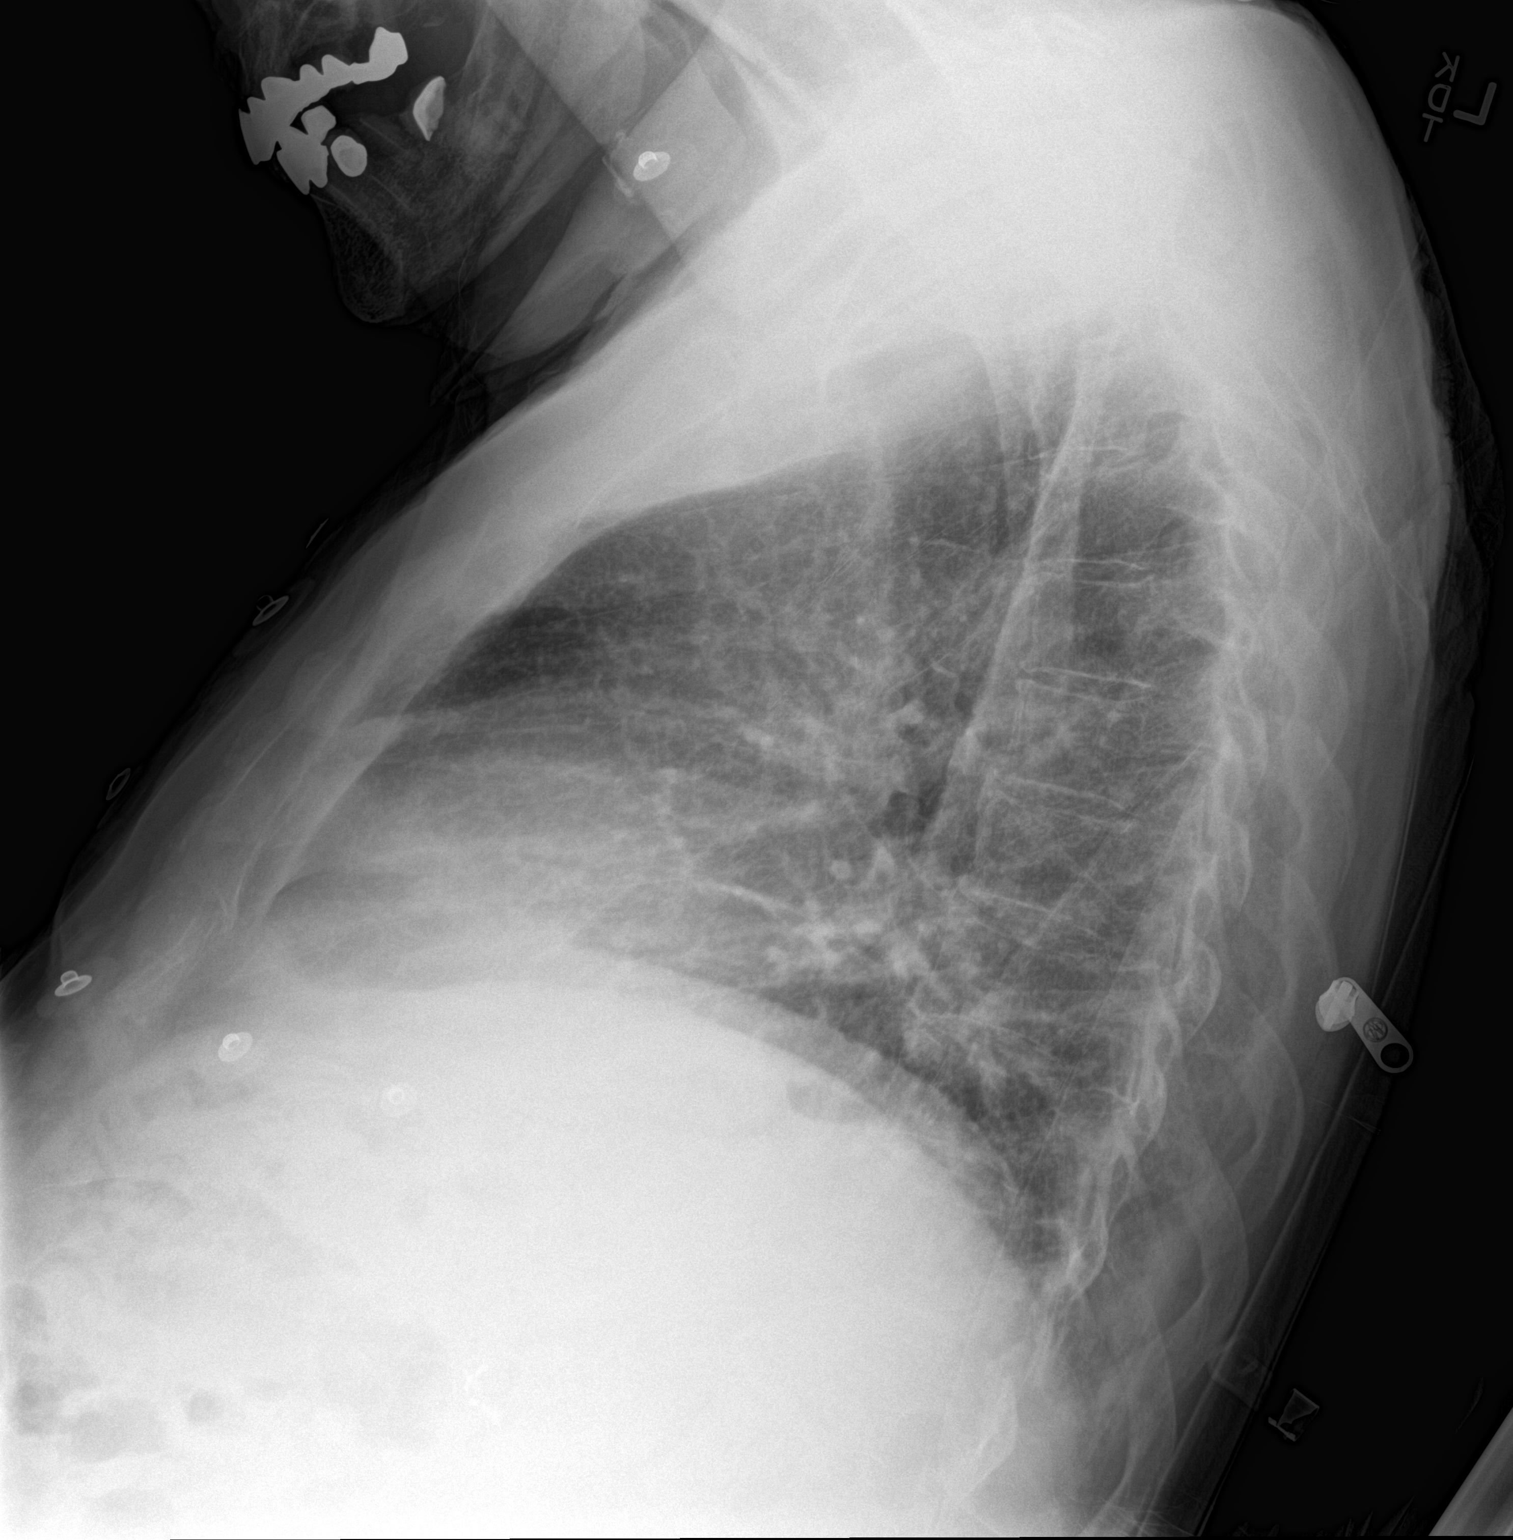

[2 of 2 positions shown; findings below may reference images not displayed]

FINDINGS: Lungs are hypoaerated with crowding of the bronchovascular markings.
Curvilinear left lower lobe scarring or atelectasis noted. Heart
size upper limits of normal. The aorta is unfolded and ectatic. No
focal pulmonary opacity otherwise. No pleural effusion.
IMPRESSION: Lungs are hypoaerated with crowding of the bronchovascular markings.
If symptoms persist, consider PA and lateral chest radiographs
obtained at full inspiration when the patient is clinically able.
